# Patient Record
Sex: Female | Born: 1970 | Race: White | Hispanic: No | Smoking: Current every day smoker
Health system: Southern US, Community
[De-identification: ages and names within clinical notes are randomized; demographics above are authoritative.]

## PROBLEM LIST (undated history)

## (undated) DIAGNOSIS — I10 Essential (primary) hypertension: Secondary | ICD-10-CM

## (undated) DIAGNOSIS — Z720 Tobacco use: Secondary | ICD-10-CM

## (undated) DIAGNOSIS — F1290 Cannabis use, unspecified, uncomplicated: Secondary | ICD-10-CM

## (undated) DIAGNOSIS — J449 Chronic obstructive pulmonary disease, unspecified: Secondary | ICD-10-CM

## (undated) DIAGNOSIS — F419 Anxiety disorder, unspecified: Secondary | ICD-10-CM

## (undated) DIAGNOSIS — D491 Neoplasm of unspecified behavior of respiratory system: Secondary | ICD-10-CM

## (undated) DIAGNOSIS — E46 Unspecified protein-calorie malnutrition: Secondary | ICD-10-CM

## (undated) DIAGNOSIS — L409 Psoriasis, unspecified: Secondary | ICD-10-CM

## (undated) DIAGNOSIS — I639 Cerebral infarction, unspecified: Secondary | ICD-10-CM

## (undated) DIAGNOSIS — R918 Other nonspecific abnormal finding of lung field: Secondary | ICD-10-CM

## (undated) DIAGNOSIS — F17200 Nicotine dependence, unspecified, uncomplicated: Secondary | ICD-10-CM

## (undated) DIAGNOSIS — D649 Anemia, unspecified: Secondary | ICD-10-CM

## (undated) DIAGNOSIS — J189 Pneumonia, unspecified organism: Secondary | ICD-10-CM

## (undated) DIAGNOSIS — F4310 Post-traumatic stress disorder, unspecified: Secondary | ICD-10-CM

## (undated) DIAGNOSIS — B20 Human immunodeficiency virus [HIV] disease: Secondary | ICD-10-CM

## (undated) DIAGNOSIS — E785 Hyperlipidemia, unspecified: Secondary | ICD-10-CM

## (undated) DIAGNOSIS — F32A Depression, unspecified: Secondary | ICD-10-CM

## (undated) DIAGNOSIS — K219 Gastro-esophageal reflux disease without esophagitis: Secondary | ICD-10-CM

## (undated) HISTORY — PX: TUBAL LIGATION: SHX77

---

## 2005-05-02 ENCOUNTER — Ambulatory Visit: Payer: Self-pay | Admitting: Nurse Practitioner

## 2005-05-06 ENCOUNTER — Emergency Department: Payer: Self-pay | Admitting: General Practice

## 2007-10-22 ENCOUNTER — Emergency Department: Payer: Self-pay | Admitting: Emergency Medicine

## 2007-12-22 ENCOUNTER — Emergency Department: Payer: Self-pay | Admitting: Emergency Medicine

## 2008-02-20 DIAGNOSIS — Z8614 Personal history of Methicillin resistant Staphylococcus aureus infection: Secondary | ICD-10-CM

## 2008-02-20 HISTORY — DX: Personal history of Methicillin resistant Staphylococcus aureus infection: Z86.14

## 2009-01-19 ENCOUNTER — Ambulatory Visit: Payer: Self-pay | Admitting: Internal Medicine

## 2009-02-19 ENCOUNTER — Ambulatory Visit: Payer: Self-pay | Admitting: Internal Medicine

## 2009-02-21 ENCOUNTER — Ambulatory Visit: Payer: Self-pay | Admitting: Internal Medicine

## 2009-03-12 ENCOUNTER — Emergency Department: Payer: Self-pay | Admitting: Emergency Medicine

## 2009-03-22 ENCOUNTER — Ambulatory Visit: Payer: Self-pay | Admitting: Internal Medicine

## 2009-07-01 ENCOUNTER — Emergency Department: Payer: Self-pay | Admitting: Internal Medicine

## 2010-01-02 DIAGNOSIS — L409 Psoriasis, unspecified: Secondary | ICD-10-CM | POA: Insufficient documentation

## 2010-10-30 ENCOUNTER — Emergency Department: Payer: Self-pay | Admitting: Emergency Medicine

## 2010-10-31 DIAGNOSIS — N920 Excessive and frequent menstruation with regular cycle: Secondary | ICD-10-CM | POA: Insufficient documentation

## 2012-06-03 DIAGNOSIS — F32A Depression, unspecified: Secondary | ICD-10-CM | POA: Insufficient documentation

## 2012-06-03 DIAGNOSIS — G8929 Other chronic pain: Secondary | ICD-10-CM | POA: Insufficient documentation

## 2012-06-03 DIAGNOSIS — K219 Gastro-esophageal reflux disease without esophagitis: Secondary | ICD-10-CM | POA: Insufficient documentation

## 2012-06-03 DIAGNOSIS — F17200 Nicotine dependence, unspecified, uncomplicated: Secondary | ICD-10-CM | POA: Insufficient documentation

## 2014-09-11 ENCOUNTER — Emergency Department: Payer: Medicaid Other

## 2014-09-11 ENCOUNTER — Inpatient Hospital Stay
Admission: EM | Admit: 2014-09-11 | Discharge: 2014-09-12 | DRG: 976 | Disposition: A | Discharge status: Home / Self Care | Payer: Medicaid Other | Attending: Internal Medicine | Admitting: Internal Medicine

## 2014-09-11 ENCOUNTER — Encounter: Payer: Self-pay | Admitting: Emergency Medicine

## 2014-09-11 DIAGNOSIS — F4310 Post-traumatic stress disorder, unspecified: Secondary | ICD-10-CM | POA: Diagnosis present

## 2014-09-11 DIAGNOSIS — J189 Pneumonia, unspecified organism: Principal | ICD-10-CM | POA: Diagnosis present

## 2014-09-11 DIAGNOSIS — K219 Gastro-esophageal reflux disease without esophagitis: Secondary | ICD-10-CM | POA: Diagnosis present

## 2014-09-11 DIAGNOSIS — Z8249 Family history of ischemic heart disease and other diseases of the circulatory system: Secondary | ICD-10-CM | POA: Diagnosis not present

## 2014-09-11 DIAGNOSIS — J449 Chronic obstructive pulmonary disease, unspecified: Secondary | ICD-10-CM | POA: Diagnosis present

## 2014-09-11 DIAGNOSIS — E876 Hypokalemia: Secondary | ICD-10-CM

## 2014-09-11 DIAGNOSIS — B20 Human immunodeficiency virus [HIV] disease: Secondary | ICD-10-CM | POA: Diagnosis present

## 2014-09-11 DIAGNOSIS — F17210 Nicotine dependence, cigarettes, uncomplicated: Secondary | ICD-10-CM | POA: Diagnosis present

## 2014-09-11 DIAGNOSIS — F419 Anxiety disorder, unspecified: Secondary | ICD-10-CM | POA: Diagnosis present

## 2014-09-11 DIAGNOSIS — Z820 Family history of epilepsy and other diseases of the nervous system: Secondary | ICD-10-CM

## 2014-09-11 HISTORY — DX: Anxiety disorder, unspecified: F41.9

## 2014-09-11 HISTORY — DX: Chronic obstructive pulmonary disease, unspecified: J44.9

## 2014-09-11 HISTORY — DX: Human immunodeficiency virus (HIV) disease: B20

## 2014-09-11 HISTORY — DX: Gastro-esophageal reflux disease without esophagitis: K21.9

## 2014-09-11 HISTORY — DX: Post-traumatic stress disorder, unspecified: F43.10

## 2014-09-11 LAB — COMPREHENSIVE METABOLIC PANEL
ALBUMIN: 3.9 g/dL (ref 3.5–5.0)
ALT: 18 U/L (ref 14–54)
AST: 28 U/L (ref 15–41)
Alkaline Phosphatase: 62 U/L (ref 38–126)
Anion gap: 9 (ref 5–15)
BUN: 6 mg/dL (ref 6–20)
CALCIUM: 8.3 mg/dL — AB (ref 8.9–10.3)
CO2: 28 mmol/L (ref 22–32)
Chloride: 97 mmol/L — ABNORMAL LOW (ref 101–111)
Creatinine, Ser: 0.76 mg/dL (ref 0.44–1.00)
GFR calc Af Amer: >60 mL/min (ref >60)
GFR calc non Af Amer: >60 mL/min (ref >60)
Glucose, Bld: 108 mg/dL — ABNORMAL HIGH (ref 65–99)
Potassium: 2.5 mmol/L — CL (ref 3.5–5.1)
Sodium: 134 mmol/L — ABNORMAL LOW (ref 135–145)
Total Bilirubin: 0.6 mg/dL (ref 0.3–1.2)
Total Protein: 7 g/dL (ref 6.5–8.1)

## 2014-09-11 LAB — TROPONIN I

## 2014-09-11 LAB — CBC
HEMATOCRIT: 28.8 % — AB (ref 35.0–47.0)
Hemoglobin: 8.3 g/dL — ABNORMAL LOW (ref 12.0–16.0)
MCH: 19.8 pg — ABNORMAL LOW (ref 26.0–34.0)
MCHC: 28.9 g/dL — ABNORMAL LOW (ref 32.0–36.0)
MCV: 68.7 fL — ABNORMAL LOW (ref 80.0–100.0)
Platelets: 259 10*3/uL (ref 150–440)
RBC: 4.2 MIL/uL (ref 3.80–5.20)
RDW: 15.9 % — AB (ref 11.5–14.5)
WBC: 17.1 10*3/uL — AB (ref 3.6–11.0)

## 2014-09-11 MED ORDER — SODIUM CHLORIDE 0.9 % IV SOLN
1000.0000 mL | Freq: Once | INTRAVENOUS | Status: AC
  Administered 2014-09-11: 1000 mL via INTRAVENOUS

## 2014-09-11 MED ORDER — DULOXETINE HCL 30 MG PO CPEP
60.0000 mg | ORAL_CAPSULE | Freq: Every day | ORAL | Status: DC
  Administered 2014-09-11 – 2014-09-12 (×2): 60 mg via ORAL
  Filled 2014-09-11 (×2): qty 2

## 2014-09-11 MED ORDER — ACETAMINOPHEN 650 MG RE SUPP: 650.0000 mg | Freq: Four times a day (QID) | RECTAL | Status: DC | PRN

## 2014-09-11 MED ORDER — ABACAVIR SULFATE 300 MG PO TABS
600.0000 mg | ORAL_TABLET | Freq: Every day | ORAL | Status: DC
  Administered 2014-09-12: 600 mg via ORAL
  Filled 2014-09-11: qty 2

## 2014-09-11 MED ORDER — LEVOFLOXACIN IN D5W 750 MG/150ML IV SOLN
750.0000 mg | Freq: Once | INTRAVENOUS | Status: AC
  Administered 2014-09-11: 750 mg via INTRAVENOUS
  Filled 2014-09-11: qty 150

## 2014-09-11 MED ORDER — KETOROLAC TROMETHAMINE 30 MG/ML IJ SOLN
INTRAMUSCULAR | Status: AC
Start: 2014-09-11 — End: 2014-09-11
  Administered 2014-09-11: 30 mg via INTRAVENOUS
  Filled 2014-09-11: qty 1

## 2014-09-11 MED ORDER — ALBUTEROL SULFATE HFA 108 (90 BASE) MCG/ACT IN AERS: 2.0000 | INHALATION_SPRAY | Freq: Four times a day (QID) | RESPIRATORY_TRACT | Status: DC | PRN

## 2014-09-11 MED ORDER — ABACAVIR-DOLUTEGRAVIR-LAMIVUD 600-50-300 MG PO TABS: 1.0000 | ORAL_TABLET | Freq: Every day | ORAL | Status: DC

## 2014-09-11 MED ORDER — OXYCODONE-ACETAMINOPHEN 5-325 MG PO TABS
1.0000 | ORAL_TABLET | Freq: Four times a day (QID) | ORAL | Status: DC | PRN
Start: 2014-09-11 — End: 2014-09-12
  Administered 2014-09-11 – 2014-09-12 (×2): 1 via ORAL
  Filled 2014-09-11 (×2): qty 1

## 2014-09-11 MED ORDER — POTASSIUM CHLORIDE 10 MEQ/100ML IV SOLN
10.0000 meq | INTRAVENOUS | Status: AC
  Administered 2014-09-11 – 2014-09-12 (×3): 10 meq via INTRAVENOUS
  Filled 2014-09-11 (×4): qty 100

## 2014-09-11 MED ORDER — KETOROLAC TROMETHAMINE 30 MG/ML IJ SOLN
30.0000 mg | Freq: Once | INTRAMUSCULAR | Status: AC
  Administered 2014-09-11: 30 mg via INTRAVENOUS

## 2014-09-11 MED ORDER — MOMETASONE FURO-FORMOTEROL FUM 100-5 MCG/ACT IN AERO
2.0000 | INHALATION_SPRAY | Freq: Two times a day (BID) | RESPIRATORY_TRACT | Status: DC
  Administered 2014-09-12 (×2): 2 via RESPIRATORY_TRACT
  Filled 2014-09-11: qty 8.8

## 2014-09-11 MED ORDER — PANTOPRAZOLE SODIUM 40 MG PO TBEC
40.0000 mg | DELAYED_RELEASE_TABLET | Freq: Every day | ORAL | Status: DC
  Administered 2014-09-12 (×2): 40 mg via ORAL
  Filled 2014-09-11 (×2): qty 1

## 2014-09-11 MED ORDER — ENOXAPARIN SODIUM 40 MG/0.4ML ~~LOC~~ SOLN
40.0000 mg | SUBCUTANEOUS | Status: DC
  Administered 2014-09-11: 40 mg via SUBCUTANEOUS
  Filled 2014-09-11: qty 0.4

## 2014-09-11 MED ORDER — NICOTINE 14 MG/24HR TD PT24
14.0000 mg | MEDICATED_PATCH | Freq: Every day | TRANSDERMAL | Status: DC
  Filled 2014-09-11: qty 1

## 2014-09-11 MED ORDER — ZOLPIDEM TARTRATE 5 MG PO TABS: 5.0000 mg | ORAL_TABLET | Freq: Every evening | ORAL | Status: DC | PRN

## 2014-09-11 MED ORDER — POTASSIUM CHLORIDE 20 MEQ PO PACK
40.0000 meq | PACK | Freq: Once | ORAL | Status: AC
  Administered 2014-09-12: 40 meq via ORAL
  Filled 2014-09-11: qty 2

## 2014-09-11 MED ORDER — ONDANSETRON HCL 4 MG/2ML IJ SOLN: 4.0000 mg | Freq: Four times a day (QID) | INTRAMUSCULAR | Status: DC | PRN

## 2014-09-11 MED ORDER — IPRATROPIUM-ALBUTEROL 0.5-2.5 (3) MG/3ML IN SOLN
3.0000 mL | Freq: Once | RESPIRATORY_TRACT | Status: AC
  Administered 2014-09-11: 3 mL via RESPIRATORY_TRACT
  Filled 2014-09-11: qty 3

## 2014-09-11 MED ORDER — LEVOFLOXACIN IN D5W 750 MG/150ML IV SOLN
750.0000 mg | INTRAVENOUS | Status: DC
  Filled 2014-09-11: qty 150

## 2014-09-11 MED ORDER — ACETAMINOPHEN 325 MG PO TABS: 650.0000 mg | ORAL_TABLET | Freq: Four times a day (QID) | ORAL | Status: DC | PRN

## 2014-09-11 MED ORDER — SENNOSIDES-DOCUSATE SODIUM 8.6-50 MG PO TABS: 1.0000 | ORAL_TABLET | Freq: Every evening | ORAL | Status: DC | PRN

## 2014-09-11 MED ORDER — POTASSIUM CHLORIDE 20 MEQ PO PACK
40.0000 meq | PACK | Freq: Once | ORAL | Status: AC
  Administered 2014-09-11: 40 meq via ORAL
  Filled 2014-09-11: qty 2

## 2014-09-11 MED ORDER — ONDANSETRON HCL 4 MG PO TABS: 4.0000 mg | ORAL_TABLET | Freq: Four times a day (QID) | ORAL | Status: DC | PRN

## 2014-09-11 MED ORDER — GUAIFENESIN-DM 100-10 MG/5ML PO SYRP: 10.0000 mL | ORAL_SOLUTION | Freq: Four times a day (QID) | ORAL | Status: DC | PRN

## 2014-09-11 MED ORDER — DOLUTEGRAVIR SODIUM 50 MG PO TABS
50.0000 mg | ORAL_TABLET | Freq: Every day | ORAL | Status: DC
  Administered 2014-09-12: 50 mg via ORAL
  Filled 2014-09-11: qty 1

## 2014-09-11 MED ORDER — ALBUTEROL SULFATE (2.5 MG/3ML) 0.083% IN NEBU: 2.5000 mg | INHALATION_SOLUTION | Freq: Four times a day (QID) | RESPIRATORY_TRACT | Status: DC | PRN

## 2014-09-11 MED ORDER — LAMIVUDINE 150 MG PO TABS
300.0000 mg | ORAL_TABLET | Freq: Every day | ORAL | Status: DC
  Administered 2014-09-12: 300 mg via ORAL
  Filled 2014-09-11: qty 2

## 2014-09-11 NOTE — ED Provider Notes (Signed)
[facility] Emergency Department Provider Note  ____________________________________________  Time seen: On arrival  I have reviewed the triage vital signs and the nursing notes.   HISTORY  Chief Complaint Pleurisy    HPI Whitney [name] is a 44 y.o. female with a history of HIV who reports compliance with her medications who presents with fever and cough and right-sided chest discomfort that started this morning. She notes that her viral load was undetectable the beginning of this month. She is always compliant with her medications. She sees Dr. Wilson Singer at [facility]. She denies dizziness. She does note shortness of breath which is relatively chronic for her given her COPD but it doesn't seem to be worse today     Past Medical History  Diagnosis Date  . COPD (chronic obstructive pulmonary disease)   . HIV disease   . Anxiety   . PTSD (post-traumatic stress disorder)   . GERD (gastroesophageal reflux disease)     There are no active problems to display for this patient.   Past Surgical History  Procedure Laterality Date  . Cesarean section    . Tubal ligation      No current outpatient prescriptions on file.  Allergies Penicillins  No family history on file.  Social History History  Substance Use Topics  . Smoking status: Current Every Day Smoker -- 0.50 packs/day    Types: Cigarettes  . Smokeless tobacco: Not on file  . Alcohol Use: No    Review of Systems  Constitutional: Positive for fever Eyes: Negative for visual changes. ENT: Negative for sore throat Cardiovascular: Negative for chest pain. Respiratory: Positive for shortness of breath and cough Gastrointestinal: Negative for abdominal pain, vomiting and diarrhea. Genitourinary: Negative for dysuria. Musculoskeletal: Negative for back pain. Skin: Negative for rash. Neurological: Negative for headaches or focal weakness Psychiatric: No anxiety  10-point ROS otherwise  negative.  ____________________________________________   PHYSICAL EXAM:  VITAL SIGNS: ED Triage Vitals  Enc Vitals Group     BP 09/11/14 1822 120/80 mmHg     Pulse Rate 09/11/14 1822 114     Resp 09/11/14 1822 20     Temp 09/11/14 1822 102 F (38.9 C)     Temp Source 09/11/14 1822 Oral     SpO2 09/11/14 1822 95 %     Weight 09/11/14 1822 118 lb (53.524 kg)     Height 09/11/14 1822 5\' 4"  (1.626 m)     Head Cir --      Peak Flow --      Pain Score 09/11/14 1825 10     Pain Loc --      Pain Edu? --      Excl. in GC? --      Constitutional: Alert and oriented. Frail-appearing Eyes: Conjunctivae are normal.  ENT   Head: Normocephalic and atraumatic.   Mouth/Throat: Mucous membranes are moist. Cardiovascular: Normal rate, regular rhythm. Normal and symmetric distal pulses are present in all extremities. No murmurs, rubs, or gallops. Respiratory: Mild tachypnea, scattered wheezes Gastrointestinal: Soft and non-tender in all quadrants. No distention. There is no CVA tenderness. Genitourinary: deferred Musculoskeletal: Nontender with normal range of motion in all extremities. No lower extremity tenderness nor edema. Neurologic:  Normal speech and language. No gross focal neurologic deficits are appreciated. Skin:  Skin is warm, dry and intact. No rash noted. Psychiatric: Mood and affect are normal. Patient exhibits appropriate insight and judgment.  ____________________________________________    LABS (pertinent positives/negatives)  Labs Reviewed  COMPREHENSIVE  METABOLIC PANEL - Abnormal; Notable for the following:    Sodium 134 (*)    Potassium 2.5 (*)    Chloride 97 (*)    Glucose, Bld 108 (*)    Calcium 8.3 (*)    All other components within normal limits  CBC - Abnormal; Notable for the following:    WBC 17.1 (*)    Hemoglobin 8.3 (*)    HCT 28.8 (*)    MCV 68.7 (*)    MCH 19.8 (*)    MCHC 28.9 (*)    RDW 15.9 (*)    All other components within normal  limits  CULTURE, BLOOD (ROUTINE X 2)  CULTURE, BLOOD (ROUTINE X 2)  TROPONIN I    ____________________________________________   EKG  ED ECG REPORT I, Jene Every, the attending physician, personally viewed and interpreted this ECG.  Date: 09/11/2014 EKG Time: 6:37 PM Rate: 105 Rhythm: Sinus tachycardia QRS Axis: normal Intervals: normal ST/T Wave abnormalities: normal Conduction Disutrbances: none Narrative Interpretation: unremarkable   ____________________________________________    RADIOLOGY I have personally reviewed any xrays that were ordered on this patient: chest x-ray shows infiltrate right lower lung  ____________________________________________   PROCEDURES  Procedure(s) performed: none  Critical Care performed: none  ____________________________________________   INITIAL IMPRESSION / ASSESSMENT AND PLAN / ED COURSE  Pertinent labs & imaging results that were available during my care of the patient were reviewed by me and considered in my medical decision making (see chart for details).  Patient with a history of HIV, COPD with fever tachycardia cough and chest discomfort most consistent with pneumonia. Blood culture sent, IV antibiotics started  ----------------------------------------- 8:09 PM on 09/11/2014 -----------------------------------------  Patient with fever tachycardia elevated white blood cell count and evidence of infection on chest x-ray and we will admit to the hospital given continued tachycardia and hypokalemia   ____________________________________________   FINAL CLINICAL IMPRESSION(S) / ED DIAGNOSES  Final diagnoses:  Community acquired pneumonia  Hypokalemia     Jene Every, MD 09/11/14 2009

## 2014-09-11 NOTE — H&P (Signed)
[facility] Physicians - [place] at [facility]   PATIENT NAME: Whitney [name]    MR#:  [id number]  DATE OF BIRTH:  [birth year]  DATE OF ADMISSION:  09/11/2014  PRIMARY CARE PHYSICIAN: Jolene Provost III, MD   REQUESTING/REFERRING PHYSICIAN: Dr.Kinner  CHIEF COMPLAINT:   Sob, fever HISTORY OF PRESENT ILLNESS:  Whitney [name]  is a 44 y.o. female with a known history of HIV, reports compliance with her all HIV medications, sees Dr. Wilson Singer at [facility], is presented to the ED with a chief complaint of three-week history of shortness of breath and one-day history of fever. Her shortness of breath is chronic from her COPD but lately p gotten worse and associated with dry cough. Denies  any sick contacts. Reports her recent viral load at the beginning of the month was undetectable  PAST MEDICAL HISTORY:   Past Medical History  Diagnosis Date  . COPD (chronic obstructive pulmonary disease)   . HIV disease   . Anxiety   . PTSD (post-traumatic stress disorder)   . GERD (gastroesophageal reflux disease)     PAST SURGICAL HISTOIRY:   Past Surgical History  Procedure Laterality Date  . Cesarean section    . Tubal ligation      SOCIAL HISTORY:   History  Substance Use Topics  . Smoking status: Current Every Day Smoker -- 0.50 packs/day    Types: Cigarettes  . Smokeless tobacco: Not on file  . Alcohol Use: No   Lives  with her sister and son FAMILY HISTORY:  Mother has history of hypertension Dad with Parkinson's disease and coronary artery disease  DRUG ALLERGIES:   Allergies  Allergen Reactions  . Penicillins Hives  . Aspirin Rash    REVIEW OF SYSTEMS:  CONSTITUTIONAL: Has fever, denies fatigue or weakness.  EYES: No blurred or double vision.  EARS, NOSE, AND THROAT: No tinnitus or ear pain.  RESPIRATORY: Reporting dry cough, worsening of chronic shortness of breath, denies wheezing or hemoptysis. Reports right-sided chest pain when  coughing CARDIOVASCULAR: No chest pain, orthopnea, edema.  GASTROINTESTINAL: No nausea, vomiting, diarrhea or abdominal pain.  GENITOURINARY: No dysuria, hematuria.  ENDOCRINE: No polyuria, nocturia,  HEMATOLOGY: No anemia, easy bruising or bleeding SKIN: No rash or lesion. MUSCULOSKELETAL: No joint pain or arthritis.   NEUROLOGIC: No tingling, numbness, weakness.  PSYCHIATRY: No anxiety or depression.   MEDICATIONS AT HOME:   Prior to Admission medications   Medication Sig Start Date End Date Taking? Authorizing Provider  Abacavir-Dolutegravir-Lamivud (TRIUMEQ) 600-50-300 MG TABS Take 1 tablet by mouth daily. 08/09/14  Yes Historical Provider, MD  albuterol (PROVENTIL HFA;VENTOLIN HFA) 108 (90 BASE) MCG/ACT inhaler Inhale 2 puffs into the lungs every 6 (six) hours as needed for wheezing or shortness of breath.   Yes Historical Provider, MD  albuterol (PROVENTIL) (2.5 MG/3ML) 0.083% nebulizer solution Take 2.5 mg by nebulization every 6 (six) hours as needed for wheezing or shortness of breath.   Yes Historical Provider, MD  DULoxetine (CYMBALTA) 30 MG capsule Take 60 mg by mouth daily. 03/31/14  Yes Historical Provider, MD  Fluticasone-Salmeterol (ADVAIR) 250-50 MCG/DOSE AEPB Inhale 1 puff into the lungs 2 (two) times daily.   Yes Historical Provider, MD  omeprazole (PRILOSEC) 40 MG capsule Take 40 mg by mouth 2 (two) times daily. 06/07/14  Yes Historical Provider, MD      VITAL SIGNS:  Blood pressure 109/79, pulse 104, temperature 102 F (38.9 C), temperature source Oral, resp. rate 16, height 5\' 4"  (1.626 m),  weight 53.524 kg (118 lb), last menstrual period 07/30/2014, SpO2 96 %.  PHYSICAL EXAMINATION:  GENERAL:  44 y.o.-year-old thin looking patient lying in the bed with no acute distress.  EYES: Pupils equal, round, reactive to light and accommodation. No scleral icterus. Extraocular muscles intact.  HEENT: Head atraumatic, normocephalic. Oropharynx and nasopharynx clear.  NECK:   Supple, no jugular venous distention. No thyroid enlargement, no tenderness.  LUNGS: Normal breath sounds bilaterally, no wheezing, rales,rhonchi. No use of accessory muscles of respiration. Positive crackles on the right side, minimal,Reproducible anterior chest wall tenderness on the right side CARDIOVASCULAR: S1, S2 normal. No murmurs, rubs, or gallops.  ABDOMEN: Soft, nontender, nondistended. Bowel sounds present. No organomegaly or mass.  EXTREMITIES: No pedal edema, cyanosis, or clubbing.  NEUROLOGIC: Cranial nerves II through XII are intact. Muscle strength 5/5 in all extremities. Sensation intact. Gait not checked.  PSYCHIATRIC: The patient is alert and oriented x 3.  SKIN: No obvious rash, lesion, or ulcer.   LABORATORY PANEL:   CBC  Recent Labs Lab 09/11/14 1839  WBC 17.1*  HGB 8.3*  HCT 28.8*  PLT 259   ------------------------------------------------------------------------------------------------------------------  Chemistries   Recent Labs Lab 09/11/14 1839  NA 134*  K 2.5*  CL 97*  CO2 28  GLUCOSE 108*  BUN 6  CREATININE 0.76  CALCIUM 8.3*  AST 28  ALT 18  ALKPHOS 62  BILITOT 0.6   ------------------------------------------------------------------------------------------------------------------  Cardiac Enzymes  Recent Labs Lab 09/11/14 1839  TROPONINI <0.03   ------------------------------------------------------------------------------------------------------------------  RADIOLOGY:  Dg Chest Port 1 View  09/11/2014   CLINICAL DATA:  Right-sided chest pain and difficulty breathing. History of HIV disease  EXAM: PORTABLE CHEST - 1 VIEW  COMPARISON:  None.  FINDINGS: There is a focal area of opacity in the right infrahilar region, felt to represent a focal area of pneumonia. Lungs elsewhere clear. Heart size and pulmonary vascularity are normal. No adenopathy. No pneumothorax. No bone lesions.  IMPRESSION: Focal area of infiltrate in the right  infrahilar region. Elsewhere lungs are clear.   Electronically Signed   By: Bretta Bang III M.D.   On: 09/11/2014 19:50    EKG:   Orders placed or performed during the hospital encounter of 09/11/14  . ED EKG  . ED EKG    IMPRESSION AND PLAN:   Whitney [name]  is a 44 y.o. female with a known history of HIV, reports compliance with her all HIV medications, sees Dr. Wilson Singer at [facility], is presented to the ED with a chief complaint of three-week history of shortness of breath and one-day history of fever  1. Sepsis with community-acquired pneumonia-meets criteria with leukocytosis and fever Treat with IV levofloxacin as her recent HIV viral load is undetectable in the beginning of the month Follow sputum culture and sensitivity and blood cultures  2. Chronic history of HIV with undetectable recent viral load Resume her home medication and outpatient follow-up with [facility] as recommended  3. Hypokalemia  Replace potassium by mouth and IV and check potassium levels and magnesium in a.m.  4. Chronic history of COPD with no exacerbation  provide neb laser treatments as needed basis  5. GERD Protonix  6. Nicotine dependence Counseled patient to quit smoking for 4-5 minutes. Will provide nicotine patch   DVT prophylaxis with Lovenox subcutaneous   All the records are reviewed and case discussed with ED provider. Management plans discussed with the patient, family and they are in agreement.  CODE STATUS: Full code, son is a  medical power of attorney  TOTAL TIME TAKING CARE OF THIS PATIENT, reviewing medical records, history and physical, admission orders , coordination of care and others: 50 minutes.    Ramonita Lab M.D on 09/11/2014 at 9:15 PM  Between 7am to 6pm - Pager - [phone]  After 6pm go to [email or web address] - password [email or web address] [facility]  [place] [facility]  Office  [phone]  CC: Primary care physician; Shaune Leeks, MD

## 2014-09-11 NOTE — ED Notes (Signed)
Blood culture draw #1 from RIGHT hand Blood culture draw #2 from LEFT AC.

## 2014-09-11 NOTE — Progress Notes (Signed)
ANTIBIOTIC CONSULT NOTE - INITIAL  Pharmacy Consult for [facility] Indication: pneumonia/CAP  Allergies  Allergen Reactions  . Penicillins Hives  . Aspirin Rash    Patient Measurements: Height:  (162.6 cm) Weight: 108 lb (48.988 kg) IBW/kg (Calculated) : 54.7 Adjusted Body Weight:   Vital Signs: Temp: 98.9 F (37.2 C) (07/23 2200) Temp Source: Oral (07/23 2200) BP: 124/86 mmHg (07/23 2200) Pulse Rate: 105 (07/23 2200) Intake/Output from previous day:   Intake/Output from this shift:    Labs:  Recent Labs  09/11/14 1839  WBC 17.1*  HGB 8.3*  PLT 259  CREATININE 0.76   Estimated Creatinine Clearance: 68.7 mL/min (by C-G formula based on Cr of 0.76).   Microbiology: No results found for this or any previous visit (from the past 720 hour(s)).  Medical History: Past Medical History  Diagnosis Date  . COPD (chronic obstructive pulmonary disease)   . HIV disease   . Anxiety   . PTSD (post-traumatic stress disorder)   . GERD (gastroesophageal reflux disease)     Medications:  Scheduled:  . [START ON 09/12/2014] abacavir  600 mg Oral Daily   And  . [START ON 09/12/2014] dolutegravir  50 mg Oral Daily   And  . [START ON 09/12/2014] lamiVUDine  300 mg Oral Daily  . DULoxetine  60 mg Oral Daily  . enoxaparin (LOVENOX) injection  40 mg Subcutaneous Q24H  . [START ON 09/12/2014] levofloxacin (LEVAQUIN) IV  750 mg Intravenous Q24H  . mometasone-formoterol  2 puff Inhalation BID  . nicotine  14 mg Transdermal Daily  . pantoprazole  40 mg Oral Daily  . [START ON 09/12/2014] potassium chloride  40 mEq Oral Once  . potassium chloride  10 mEq Intravenous Q1 Hr x 4   Anti-infectives    Start     Dose/Rate Route Frequency Ordered Stop   09/12/14 1800  levofloxacin (LEVAQUIN) IVPB 750 mg     750 mg 100 mL/hr over 90 Minutes Intravenous Every 24 hours 09/11/14 2216     09/12/14 1000  abacavir (ZIAGEN) tablet 600 mg     600 mg Oral Daily 09/11/14 2203     09/12/14  1000  dolutegravir (TIVICAY) tablet 50 mg     50 mg Oral Daily 09/11/14 2203     09/12/14 1000  lamiVUDine (EPIVIR) tablet 300 mg     300 mg Oral Daily 09/11/14 2203     09/11/14 2200  Abacavir-Dolutegravir-Lamivud 600-50-300 MG TABS 1 tablet  Status:  Discontinued     1 tablet Oral Daily 09/11/14 2158 09/11/14 2201   09/11/14 1945  levofloxacin (LEVAQUIN) IVPB 750 mg     750 mg 100 mL/hr over 90 Minutes Intravenous  Once 09/11/14 1932 09/11/14 2114     Assessment: 44 yo female with CAP, hx HIV with undectable viral load.    Plan:  Follow up culture results  Patient received Levaquin  IV x 1 dose.  Will continue with Levaquin  IV Q24h.  Bari Mantis PharmD Clinical Pharmacist 09/11/2014

## 2014-09-11 NOTE — ED Notes (Signed)
States has COPD, states had been SOB which she rlated to heat, woke up with severe R chest pain exp with inspiration

## 2014-09-12 LAB — CBC
HEMATOCRIT: 27 % — AB (ref 35.0–47.0)
HEMOGLOBIN: 7.6 g/dL — AB (ref 12.0–16.0)
MCH: 19.5 pg — AB (ref 26.0–34.0)
MCHC: 28.2 g/dL — AB (ref 32.0–36.0)
MCV: 69.2 fL — ABNORMAL LOW (ref 80.0–100.0)
Platelets: 224 10*3/uL (ref 150–440)
RBC: 3.9 MIL/uL (ref 3.80–5.20)
RDW: 16 % — AB (ref 11.5–14.5)
WBC: 12.7 10*3/uL — ABNORMAL HIGH (ref 3.6–11.0)

## 2014-09-12 LAB — COMPREHENSIVE METABOLIC PANEL
ALT: 16 U/L (ref 14–54)
AST: 22 U/L (ref 15–41)
Albumin: 3.1 g/dL — ABNORMAL LOW (ref 3.5–5.0)
Alkaline Phosphatase: 54 U/L (ref 38–126)
Anion gap: 8 (ref 5–15)
BUN: 7 mg/dL (ref 6–20)
CALCIUM: 8.6 mg/dL — AB (ref 8.9–10.3)
CO2: 29 mmol/L (ref 22–32)
CREATININE: 0.78 mg/dL (ref 0.44–1.00)
Chloride: 104 mmol/L (ref 101–111)
GFR calc non Af Amer: >60 mL/min (ref >60)
Glucose, Bld: 124 mg/dL — ABNORMAL HIGH (ref 65–99)
Potassium: 2.9 mmol/L — ABNORMAL LOW (ref 3.5–5.1)
Sodium: 141 mmol/L (ref 135–145)
TOTAL PROTEIN: 5.9 g/dL — AB (ref 6.5–8.1)
Total Bilirubin: 0.5 mg/dL (ref 0.3–1.2)

## 2014-09-12 LAB — POTASSIUM
POTASSIUM: 4.2 mmol/L (ref 3.5–5.1)
Potassium: 2.9 mmol/L — CL (ref 3.5–5.1)

## 2014-09-12 LAB — MAGNESIUM: MAGNESIUM: 1.8 mg/dL (ref 1.7–2.4)

## 2014-09-12 MED ORDER — POTASSIUM CHLORIDE CRYS ER 20 MEQ PO TBCR
40.0000 meq | EXTENDED_RELEASE_TABLET | ORAL | Status: AC
  Administered 2014-09-12 (×2): 40 meq via ORAL
  Filled 2014-09-12 (×2): qty 2

## 2014-09-12 MED ORDER — MAGNESIUM SULFATE IN D5W 10-5 MG/ML-% IV SOLN
1.0000 g | Freq: Once | INTRAVENOUS | Status: AC
  Administered 2014-09-12: 1 g via INTRAVENOUS
  Filled 2014-09-12: qty 100

## 2014-09-12 MED ORDER — FERROUS SULFATE 325 (65 FE) MG PO TABS: 325.0000 mg | ORAL_TABLET | Freq: Two times a day (BID) | ORAL | Status: DC

## 2014-09-12 MED ORDER — LEVOFLOXACIN 750 MG PO TABS: 750.0000 mg | ORAL_TABLET | Freq: Every day | ORAL | Status: DC

## 2014-09-12 MED ORDER — POTASSIUM CHLORIDE 10 MEQ/100ML IV SOLN
10.0000 meq | INTRAVENOUS | Status: DC
  Filled 2014-09-12 (×4): qty 100

## 2014-09-12 NOTE — Progress Notes (Signed)
Discharge Note:  Discharge instructions given to pt, pt verbalized understanding. Pts VSS. Pt wheeled to car.

## 2014-09-12 NOTE — Discharge Summary (Signed)
[facility] Physicians - [place] at [facility]   PATIENT NAME: Whitney [name]    MR#:  [id number]  DATE OF BIRTH:  10/12/1970  DATE OF ADMISSION:  09/11/2014 ADMITTING PHYSICIAN: Ramonita Lab, MD  DATE OF DISCHARGE: 09/12/2014 PRIMARY CARE PHYSICIAN: Jolene Provost III, MD    ADMISSION DIAGNOSIS:  Hypokalemia [E87.6] Community acquired pneumonia [J18.9]  DISCHARGE DIAGNOSIS:  Active Problems:   PNA (pneumonia)   SECONDARY DIAGNOSIS:   Past Medical History  Diagnosis Date  . COPD (chronic obstructive pulmonary disease)   . HIV disease   . Anxiety   . PTSD (post-traumatic stress disorder)   . GERD (gastroesophageal reflux disease)     HOSPITAL COURSE:  44 y.o. female with a known history of HIV, reports compliance with her all HIV medications, sees Dr. Wilson Singer at [facility], is presented to the ED with a chief complaint of three-week history of shortness of breath and one-day history of fever. For further details, refer to H and P.  1. Sepsis with community-acquired pneumonia-meets criteria with leukocytosis and fever She was treated with  IV levofloxacin as her recent HIV viral load is undetectable. She did well with this treatment and was afebrile and not hypoxic. She should have follow up CXR in 6 weeks due to her history of smoking. Blood cultures are negative to date.  2. Chronic history of HIV with undetectable recent viral load Continue her home medications and outpatient follow-up with [facility] is recommended  3. Hypokalemia: Repleted prior to discharge.   4. Chronic history of COPD with no exacerbation:   5. GERD: Continue PPI.  6. Nicotine dependence Encouraged to quit smoking   DISCHARGE CONDITIONS AND DIET:  Stable for discharge Heart Healthy Diet  CONSULTS OBTAINED:  Treatment Team:  Ramonita Lab, MD  DRUG ALLERGIES:   Allergies  Allergen Reactions  . Penicillins Hives  . Aspirin Rash    DISCHARGE MEDICATIONS:   Current Discharge  Medication List    START taking these medications   Details  ferrous sulfate 325 (65 FE) MG tablet Take 1 tablet (325 mg total) by mouth 2 (two) times daily with a meal. Qty: 60 tablet, Refills: 0    levofloxacin (LEVAQUIN) 750 MG tablet Take 1 tablet (750 mg total) by mouth daily. Qty: 6 tablet, Refills: 0      CONTINUE these medications which have NOT CHANGED   Details  Abacavir-Dolutegravir-Lamivud (TRIUMEQ) 600-50-300 MG TABS Take 1 tablet by mouth daily.    albuterol (PROVENTIL HFA;VENTOLIN HFA) 108 (90 BASE) MCG/ACT inhaler Inhale 2 puffs into the lungs every 6 (six) hours as needed for wheezing or shortness of breath.    albuterol (PROVENTIL) (2.5 MG/3ML) 0.083% nebulizer solution Take 2.5 mg by nebulization every 6 (six) hours as needed for wheezing or shortness of breath.    DULoxetine (CYMBALTA) 30 MG capsule Take 60 mg by mouth daily.    Fluticasone-Salmeterol (ADVAIR) 250-50 MCG/DOSE AEPB Inhale 1 puff into the lungs 2 (two) times daily.    omeprazole (PRILOSEC) 40 MG capsule Take 40 mg by mouth 2 (two) times daily.              Today   CHIEF COMPLAINT:  No issues overnight. NO SOB or fevers. Feeling good   VITAL SIGNS:  Blood pressure 133/83, pulse 82, temperature 97.6 F (36.4 C), temperature source Oral, resp. rate 18, height 5\' 4"  (1.626 m), weight 48.988 kg (108 lb), last menstrual period 07/30/2014, SpO2 100 %.   REVIEW OF SYSTEMS:  Review of Systems  Constitutional: Negative for fever, chills and malaise/fatigue.  HENT: Negative for sore throat.   Eyes: Negative for blurred vision.  Respiratory: Negative for cough, hemoptysis, shortness of breath and wheezing.   Cardiovascular: Negative for chest pain, palpitations and leg swelling.  Gastrointestinal: Negative for nausea, vomiting, abdominal pain, diarrhea and blood in stool.  Genitourinary: Negative for dysuria.  Musculoskeletal: Negative for back pain.  Neurological: Negative for dizziness,  tremors and headaches.  Endo/Heme/Allergies: Does not bruise/bleed easily.     PHYSICAL EXAMINATION:  GENERAL:  44 y.o.-year-old patient lying in the bed with no acute distress.  NECK:  Supple, no jugular venous distention. No thyroid enlargement, no tenderness.  LUNGS: Normal breath sounds bilaterally, no wheezing, rales,rhonchi  No use of accessory muscles of respiration.  CARDIOVASCULAR: S1, S2 normal. No murmurs, rubs, or gallops.  ABDOMEN: Soft, non-tender, non-distended. Bowel sounds present. No organomegaly or mass.  EXTREMITIES: No pedal edema, cyanosis, or clubbing.  PSYCHIATRIC: The patient is alert and oriented x 3.  SKIN: No obvious rash, lesion, or ulcer.   DATA REVIEW:   CBC  Recent Labs Lab 09/12/14 0416  WBC 12.7*  HGB 7.6*  HCT 27.0*  PLT 224    Chemistries   Recent Labs Lab 09/12/14 0416  NA 141  K 2.9*  CL 104  CO2 29  GLUCOSE 124*  BUN 7  CREATININE 0.78  CALCIUM 8.6*  MG 1.8  AST 22  ALT 16  ALKPHOS 54  BILITOT 0.5    Cardiac Enzymes  Recent Labs Lab 09/11/14 1839  TROPONINI <0.03    Microbiology Results  @  RADIOLOGY:  Dg Chest Port 1 View  09/11/2014   CLINICAL DATA:  Right-sided chest pain and difficulty breathing. History of HIV disease  EXAM: PORTABLE CHEST - 1 VIEW  COMPARISON:  None.  FINDINGS: There is a focal area of opacity in the right infrahilar region, felt to represent a focal area of pneumonia. Lungs elsewhere clear. Heart size and pulmonary vascularity are normal. No adenopathy. No pneumothorax. No bone lesions.  IMPRESSION: Focal area of infiltrate in the right infrahilar region. Elsewhere lungs are clear.   Electronically Signed   By: Bretta Bang III M.D.   On: 09/11/2014 19:50      Management plans discussed with the patient and she is in agreement. Stable for discharge home  Patient should follow up with PCP in 1 week  CODE STATUS:     Code Status Orders        Start     Ordered    09/11/14 2159  Full code   Continuous     09/11/14 2158      TOTAL TIME TAKING CARE OF THIS PATIENT: 35 minutes.    Kerrie Latour M.D on 09/12/2014 at 10:32 AM  Between 7am to 6pm - Pager - [phone] After 6pm go to [email or web address] - password [email or web address] [facility]  [place] [facility]  Office  [phone]  CC: Primary care physician; Shaune Leeks, MD

## 2014-09-12 NOTE — Progress Notes (Signed)
Lab called with critical lab value of K 2.9. MD Mody notified and orders were placed. Will continue to monitor.

## 2014-09-12 NOTE — Progress Notes (Signed)
Notified Dr Juliene Pina of pt lab value potassium of  4.2  Stated pt okay for discharge

## 2014-09-12 NOTE — Progress Notes (Signed)
Pt to be discharged after potassium has been replaced however no serum potassium recheck has been ordered , nursing called Dr Juliene Pina and discussed situation and Dr Juliene Pina will order lab for recheck of potassium

## 2014-09-12 NOTE — Progress Notes (Signed)
Reported to Dr Juliene Pina serum POT 2.9 orders received

## 2014-09-16 LAB — CULTURE, BLOOD (ROUTINE X 2)
CULTURE: NO GROWTH
Culture: NO GROWTH

## 2014-09-29 ENCOUNTER — Other Ambulatory Visit: Payer: Self-pay

## 2014-09-29 ENCOUNTER — Encounter: Payer: Self-pay | Admitting: *Deleted

## 2014-09-29 ENCOUNTER — Emergency Department: Payer: Medicaid Other

## 2014-09-29 ENCOUNTER — Emergency Department
Admission: EM | Admit: 2014-09-29 | Discharge: 2014-09-29 | Disposition: A | Discharge status: Home / Self Care | Payer: Medicaid Other | Attending: Emergency Medicine | Admitting: Emergency Medicine

## 2014-09-29 DIAGNOSIS — R0600 Dyspnea, unspecified: Secondary | ICD-10-CM | POA: Diagnosis present

## 2014-09-29 DIAGNOSIS — E876 Hypokalemia: Secondary | ICD-10-CM | POA: Diagnosis not present

## 2014-09-29 DIAGNOSIS — Z880 Allergy status to penicillin: Secondary | ICD-10-CM | POA: Insufficient documentation

## 2014-09-29 DIAGNOSIS — Z7952 Long term (current) use of systemic steroids: Secondary | ICD-10-CM | POA: Diagnosis not present

## 2014-09-29 DIAGNOSIS — J441 Chronic obstructive pulmonary disease with (acute) exacerbation: Secondary | ICD-10-CM | POA: Insufficient documentation

## 2014-09-29 DIAGNOSIS — Z7951 Long term (current) use of inhaled steroids: Secondary | ICD-10-CM | POA: Diagnosis not present

## 2014-09-29 DIAGNOSIS — Z79899 Other long term (current) drug therapy: Secondary | ICD-10-CM | POA: Diagnosis not present

## 2014-09-29 DIAGNOSIS — Z720 Tobacco use: Secondary | ICD-10-CM | POA: Diagnosis not present

## 2014-09-29 LAB — COMPREHENSIVE METABOLIC PANEL
ALK PHOS: 72 U/L (ref 38–126)
ALT: 86 U/L — ABNORMAL HIGH (ref 14–54)
ANION GAP: 10 (ref 5–15)
AST: 186 U/L — ABNORMAL HIGH (ref 15–41)
Albumin: 3.9 g/dL (ref 3.5–5.0)
BILIRUBIN TOTAL: 0.3 mg/dL (ref 0.3–1.2)
BUN: 11 mg/dL (ref 6–20)
CO2: 27 mmol/L (ref 22–32)
CREATININE: 0.85 mg/dL (ref 0.44–1.00)
Calcium: 8.6 mg/dL — ABNORMAL LOW (ref 8.9–10.3)
Chloride: 105 mmol/L (ref 101–111)
GFR calc non Af Amer: >60 mL/min (ref >60)
GLUCOSE: 134 mg/dL — AB (ref 65–99)
POTASSIUM: 2.9 mmol/L — AB (ref 3.5–5.1)
Sodium: 142 mmol/L (ref 135–145)
Total Protein: 6.8 g/dL (ref 6.5–8.1)

## 2014-09-29 LAB — CBC WITH DIFFERENTIAL/PLATELET
Basophils Absolute: 0 10*3/uL (ref 0–0.1)
Basophils Relative: 0 %
Eosinophils Absolute: 0.1 10*3/uL (ref 0–0.7)
HEMATOCRIT: 27.5 % — AB (ref 35.0–47.0)
HEMOGLOBIN: 7.8 g/dL — AB (ref 12.0–16.0)
LYMPHS ABS: 1.1 10*3/uL (ref 1.0–3.6)
Lymphocytes Relative: 15 %
MCH: 19.2 pg — ABNORMAL LOW (ref 26.0–34.0)
MCHC: 28.4 g/dL — ABNORMAL LOW (ref 32.0–36.0)
MCV: 67.8 fL — AB (ref 80.0–100.0)
MONO ABS: 0.4 10*3/uL (ref 0.2–0.9)
Monocytes Relative: 5 %
NEUTROS ABS: 5.8 10*3/uL (ref 1.4–6.5)
Neutrophils Relative %: 79 %
Platelets: 219 10*3/uL (ref 150–440)
RBC: 4.06 MIL/uL (ref 3.80–5.20)
RDW: 16.4 % — ABNORMAL HIGH (ref 11.5–14.5)
WBC: 7.4 10*3/uL (ref 3.6–11.0)

## 2014-09-29 LAB — TROPONIN I: Troponin I: <0.03 ng/mL (ref <0.031)

## 2014-09-29 LAB — ETHANOL: Alcohol, Ethyl (B): <5 mg/dL (ref <5)

## 2014-09-29 MED ORDER — POTASSIUM CHLORIDE CRYS ER 20 MEQ PO TBCR
40.0000 meq | EXTENDED_RELEASE_TABLET | Freq: Once | ORAL | Status: AC
  Administered 2014-09-29: 40 meq via ORAL
  Filled 2014-09-29: qty 2

## 2014-09-29 MED ORDER — PREDNISONE 10 MG PO TABS: 50.0000 mg | ORAL_TABLET | Freq: Every day | ORAL | Status: DC

## 2014-09-29 MED ORDER — IPRATROPIUM-ALBUTEROL 0.5-2.5 (3) MG/3ML IN SOLN
3.0000 mL | Freq: Once | RESPIRATORY_TRACT | Status: AC
  Administered 2014-09-29: 3 mL via RESPIRATORY_TRACT
  Filled 2014-09-29: qty 3

## 2014-09-29 NOTE — ED Notes (Addendum)
Pt recently admitted for pneumonia, d/c finished Abx, pt states not feeling better. Pt has odor of ETOH, continues to smoke cigarettes. EMS gave duo neb x2, PIV, 125 mg Sol medrol

## 2014-09-29 NOTE — ED Provider Notes (Signed)
[facility] Emergency Department Provider Note   ____________________________________________  Time seen: 8:55 AM I have reviewed triage vital signs and triage nursing note.  HISTORY  Chief Complaint Respiratory Distress   Historian Patient  HPI Whitney [name] is a 44 y.o. female who has history of HIV disease, is compliant with her medications, and reports recent undetectable viral load, who was recently admitted in the hospital and treated for a pneumonia, who is here now for shortness of breath and wheezing. She reports the last 1-2 days have had increased shortness of breath and wheezing similar to her COPD. She does report that she had improved from the pneumonia. No fevers. No chest pain. Symptoms are moderate to severe. Does not use home O2. No fever.    Past Medical History  Diagnosis Date  . COPD (chronic obstructive pulmonary disease)   . HIV disease   . Anxiety   . PTSD (post-traumatic stress disorder)   . GERD (gastroesophageal reflux disease)     Patient Active Problem List   Diagnosis Date Noted  . PNA (pneumonia) 09/11/2014    Past Surgical History  Procedure Laterality Date  . Cesarean section    . Tubal ligation      Current Outpatient Rx  Name  Route  Sig  Dispense  Refill  . Abacavir-Dolutegravir-Lamivud (TRIUMEQ) 600-50-300 MG TABS   Oral   Take 1 tablet by mouth daily.         Marland Kitchen albuterol (PROVENTIL HFA;VENTOLIN HFA) 108 (90 BASE) MCG/ACT inhaler   Inhalation   Inhale 2 puffs into the lungs every 6 (six) hours as needed for wheezing or shortness of breath.         Marland Kitchen albuterol (PROVENTIL) (2.5 MG/3ML) 0.083% nebulizer solution   Nebulization   Take 2.5 mg by nebulization every 6 (six) hours as needed for wheezing or shortness of breath.         . DULoxetine (CYMBALTA) 30 MG capsule   Oral   Take 60 mg by mouth daily.         . ferrous sulfate 325 (65 FE) MG tablet   Oral   Take 1 tablet (325 mg total)  by mouth 2 (two) times daily with a meal.   60 tablet   0   . Fluticasone-Salmeterol (ADVAIR) 250-50 MCG/DOSE AEPB   Inhalation   Inhale 1 puff into the lungs 2 (two) times daily.         Marland Kitchen omeprazole (PRILOSEC) 40 MG capsule   Oral   Take 40 mg by mouth 2 (two) times daily.         Marland Kitchen levofloxacin (LEVAQUIN) 750 MG tablet   Oral   Take 1 tablet (750 mg total) by mouth daily. Patient not taking: Reported on 09/29/2014   6 tablet   0   . predniSONE (DELTASONE) 10 MG tablet   Oral   Take 5 tablets (50 mg total) by mouth daily.   20 tablet   0     Allergies Penicillins and Aspirin  History reviewed. No pertinent family history.  Social History Social History  Substance Use Topics  . Smoking status: Current Every Day Smoker -- 0.50 packs/day    Types: Cigarettes  . Smokeless tobacco: None  . Alcohol Use: No    Review of Systems  Constitutional: Negative for fever. Eyes: Negative for visual changes. ENT: Negative for sore throat. Cardiovascular: Negative for chest pain. Positive for chest tightness. Respiratory: Positive for shortness of breath. No  pleuritic chest pain. Mild dry cough. Gastrointestinal: Negative for abdominal pain, vomiting and diarrhea. Genitourinary: Negative for dysuria. Musculoskeletal: Negative for back pain. Skin: Negative for rash. Neurological: Negative for headaches, focal weakness or numbness. 10 point Review of Systems otherwise negative ____________________________________________   PHYSICAL EXAM:  VITAL SIGNS: ED Triage Vitals  Enc Vitals Group     BP 09/29/14 0807 119/79 mmHg     Pulse Rate 09/29/14 0807 101     Resp --      Temp 09/29/14 0807 97.8 F (36.6 C)     Temp Source 09/29/14 0807 Oral     SpO2 09/29/14 0803 96 %     Weight 09/29/14 0807 102 lb (46.267 kg)     Height 09/29/14 0807  (1.626 m)     Head Cir --      Peak Flow --      Pain Score 09/29/14 0811 0     Pain Loc --      Pain Edu? --       Excl. in GC? --      Constitutional: Alert and oriented. Well appearing and in no distress. Eyes: Conjunctivae are normal. PERRL. Normal extraocular movements. ENT   Head: Normocephalic and atraumatic.   Nose: No congestion/rhinnorhea.   Mouth/Throat: Mucous membranes are mildly dry..   Neck: No stridor. Cardiovascular/Chest: Normal rate, regular rhythm.  No murmurs, rubs, or gallops. Respiratory: Substernal retractions. Increased respiratory effort. Decreased air movement throughout all lung fields. Moderate and expiratory wheeze all fields. No rhonchi. Gastrointestinal: Soft. No distention, no guarding, no rebound. Nontender   Genitourinary/rectal:Deferred Musculoskeletal: Nontender with normal range of motion in all extremities. No joint effusions.  No lower extremity tenderness nor edema. Neurologic:  Normal speech and language. No gross or focal neurologic deficits are appreciated. Skin:  Skin is warm, dry and intact. No rash noted. Psychiatric: Mood and affect are normal. Speech and behavior are normal. Patient exhibits appropriate insight and judgment.  ____________________________________________   EKG I, Governor Rooks, MD, the attending physician have personally viewed and interpreted all ECGs.  96 bpm. Normal sinus rhythm. Narrow QRS. Normal axis. Nonspecific ST-T wave. QTc 433 ____________________________________________  LABS (pertinent positives/negatives)  White blood count 7.4, hemoglobin 7.8, platelet is 219 Complete metabolic panel significant for potassium 2.9, glucose 134, AST 186 and a LT 86 Troponin less than 0.03 Ethanol less than 5  ____________________________________________  RADIOLOGY All Xrays were viewed by me. Imaging interpreted by Radiologist.  Chest x-ray two-view: Hyperinflation. Central mild bronchitic change. No focal infiltrate or pulmonary edema.  __________________________________________  PROCEDURES  Procedure(s)  performed: None Critical Care performed: None  ____________________________________________   ED COURSE / ASSESSMENT AND PLAN  CONSULTATIONS: None  Pertinent labs & imaging results that were available during my care of the patient were reviewed by me and considered in my medical decision making (see chart for details).  Patient arrived with moderate to severe wheezing which did improve after DuoNeb. Patient was able to walk without desaturation, or any trouble. Patient is asking to go home. She will be treated with a prednisone burst as well as when necessary albuterol.  Patient much improved at 12 before discharge.  Patient / Family / Caregiver informed of clinical course, medical decision-making process, and agree with plan.   I discussed return precautions, follow-up instructions, and discharged instructions with patient and/or family.  ___________________________________________   FINAL CLINICAL IMPRESSION(S) / ED DIAGNOSES   Final diagnoses:  Hypokalemia  COPD exacerbation    FOLLOW  UP  Referred to: Primary care physician, 2 days.   Governor Rooks, MD 09/29/14 [phone]

## 2014-09-29 NOTE — Discharge Instructions (Signed)
You were treated for COPD exacerbation. You're being started on 4 additional days of prednisone. Take your albuterol inhaler every 4 hours as needed for wheezing or shortness breath.  Return to the emergency department for any worsening condition including chest pain, trouble breathing, shortness breath, fever, altered mental status, or any other symptoms concerning to you.   Chronic Obstructive Pulmonary Disease Chronic obstructive pulmonary disease (COPD) is a common lung problem. In COPD, the flow of air from the lungs is limited. The way your lungs work will probably never return to normal, but there are things you can do to improve your lungs and make yourself feel better. HOME CARE  Take all medicines as told by your doctor.  Avoid medicines or cough syrups that dry up your airway (such as antihistamines) and do not allow you to get rid of thick spit. You do not need to avoid them if told differently by your doctor.  If you smoke, stop. Smoking makes the problem worse.  Avoid being around things that make your breathing worse (like smoke, chemicals, and fumes).  Use oxygen therapy and therapy to help improve your lungs (pulmonary rehabilitation) if told by your doctor. If you need home oxygen therapy, ask your doctor if you should buy a tool to measure your oxygen level (oximeter).  Avoid people who have a sickness you can catch (contagious).  Avoid going outside when it is very hot, cold, or humid.  Eat healthy foods. Eat smaller meals more often. Rest before meals.  Stay active, but remember to also rest.  Make sure to get all the shots (vaccines) your doctor recommends. Ask your doctor if you need a pneumonia shot.  Learn and use tips on how to relax.  Learn and use tips on how to control your breathing as told by your doctor. Try:  Breathing in (inhaling) through your nose for 1 second. Then, pucker your lips and breath out (exhale) through your lips for 2  seconds.  Putting one hand on your belly (abdomen). Breathe in slowly through your nose for 1 second. Your hand on your belly should move out. Pucker your lips and breathe out slowly through your lips. Your hand on your belly should move in as you breathe out.  Learn and use controlled coughing to clear thick spit from your lungs. The steps are: 1. Lean your head a little forward. 2. Breathe in deeply. 3. Try to hold your breath for 3 seconds. 4. Keep your mouth slightly open while coughing 2 times. 5. Spit any thick spit out into a tissue. 6. Rest and do the steps again 1 or 2 times as needed. GET HELP IF:  You cough up more thick spit than usual.  There is a change in the color or thickness of the spit.  It is harder to breathe than usual.  Your breathing is faster than usual. GET HELP RIGHT AWAY IF:   You have shortness of breath while resting.  You have shortness of breath that stops you from:  Being able to talk.  Doing normal activities.  You chest hurts for longer than 5 minutes.  Your skin color is more blue than usual.  Your pulse oximeter shows that you have low oxygen for longer than 5 minutes. MAKE SURE YOU:   Understand these instructions.  Will watch your condition.  Will get help right away if you are not doing well or get worse. Document Released: 07/25/2007 Document Revised: 06/22/2013 Document Reviewed: 10/02/2012 [employer] Patient Information  2015 [employer]. This information is not intended to replace advice given to you by your health care provider. Make sure you discuss any questions you have with your health care provider. ° °

## 2014-09-29 NOTE — ED Notes (Signed)
Patient transported to X-ray 

## 2014-09-29 NOTE — ED Notes (Signed)
Amb pt: O2 sats remained at 98% on RA, notified Dr Shaune Pollack pt was ready to leave.

## 2014-09-29 NOTE — ED Notes (Signed)
Lab called for critical value  K+ 2.9  Notified Dr Shaune Pollack.

## 2014-09-30 ENCOUNTER — Observation Stay
Admission: EM | Admit: 2014-09-30 | Discharge: 2014-10-02 | Disposition: A | Discharge status: Home / Self Care | Payer: Medicaid Other | Attending: Internal Medicine | Admitting: Internal Medicine

## 2014-09-30 ENCOUNTER — Encounter: Payer: Self-pay | Admitting: Emergency Medicine

## 2014-09-30 ENCOUNTER — Emergency Department: Payer: Medicaid Other

## 2014-09-30 DIAGNOSIS — B20 Human immunodeficiency virus [HIV] disease: Secondary | ICD-10-CM

## 2014-09-30 DIAGNOSIS — J209 Acute bronchitis, unspecified: Secondary | ICD-10-CM

## 2014-09-30 DIAGNOSIS — Z886 Allergy status to analgesic agent status: Secondary | ICD-10-CM | POA: Insufficient documentation

## 2014-09-30 DIAGNOSIS — R05 Cough: Secondary | ICD-10-CM | POA: Insufficient documentation

## 2014-09-30 DIAGNOSIS — Z8701 Personal history of pneumonia (recurrent): Secondary | ICD-10-CM | POA: Insufficient documentation

## 2014-09-30 DIAGNOSIS — Z880 Allergy status to penicillin: Secondary | ICD-10-CM | POA: Insufficient documentation

## 2014-09-30 DIAGNOSIS — Z79899 Other long term (current) drug therapy: Secondary | ICD-10-CM | POA: Diagnosis not present

## 2014-09-30 DIAGNOSIS — J440 Chronic obstructive pulmonary disease with acute lower respiratory infection: Secondary | ICD-10-CM | POA: Insufficient documentation

## 2014-09-30 DIAGNOSIS — J9621 Acute and chronic respiratory failure with hypoxia: Principal | ICD-10-CM | POA: Insufficient documentation

## 2014-09-30 DIAGNOSIS — K219 Gastro-esophageal reflux disease without esophagitis: Secondary | ICD-10-CM | POA: Insufficient documentation

## 2014-09-30 DIAGNOSIS — J9601 Acute respiratory failure with hypoxia: Secondary | ICD-10-CM

## 2014-09-30 DIAGNOSIS — Z8249 Family history of ischemic heart disease and other diseases of the circulatory system: Secondary | ICD-10-CM | POA: Insufficient documentation

## 2014-09-30 DIAGNOSIS — F4310 Post-traumatic stress disorder, unspecified: Secondary | ICD-10-CM | POA: Insufficient documentation

## 2014-09-30 DIAGNOSIS — F17210 Nicotine dependence, cigarettes, uncomplicated: Secondary | ICD-10-CM | POA: Insufficient documentation

## 2014-09-30 DIAGNOSIS — Z21 Asymptomatic human immunodeficiency virus [HIV] infection status: Secondary | ICD-10-CM | POA: Diagnosis not present

## 2014-09-30 DIAGNOSIS — Z7951 Long term (current) use of inhaled steroids: Secondary | ICD-10-CM | POA: Insufficient documentation

## 2014-09-30 DIAGNOSIS — J449 Chronic obstructive pulmonary disease, unspecified: Secondary | ICD-10-CM

## 2014-09-30 DIAGNOSIS — F419 Anxiety disorder, unspecified: Secondary | ICD-10-CM | POA: Insufficient documentation

## 2014-09-30 DIAGNOSIS — R0902 Hypoxemia: Secondary | ICD-10-CM | POA: Diagnosis present

## 2014-09-30 DIAGNOSIS — E440 Moderate protein-calorie malnutrition: Secondary | ICD-10-CM

## 2014-09-30 DIAGNOSIS — J441 Chronic obstructive pulmonary disease with (acute) exacerbation: Secondary | ICD-10-CM

## 2014-09-30 LAB — CBC WITH DIFFERENTIAL/PLATELET
Basophils Absolute: 0 10*3/uL (ref 0–0.1)
Basophils Relative: 0 %
Eosinophils Absolute: 0 10*3/uL (ref 0–0.7)
Eosinophils Relative: 0 %
HEMATOCRIT: 28.7 % — AB (ref 35.0–47.0)
Hemoglobin: 8.2 g/dL — ABNORMAL LOW (ref 12.0–15.0)
Lymphs Abs: 1.3 10*3/uL (ref 1.0–3.6)
MCH: 19.3 pg — ABNORMAL LOW (ref 26.0–34.0)
MCHC: 28.4 g/dL — ABNORMAL LOW (ref 32.0–36.0)
MCV: 68 fL — ABNORMAL LOW (ref 80.0–100.0)
Monocytes Absolute: 0.4 10*3/uL (ref 0.2–0.9)
Monocytes Relative: 4 %
NEUTROS ABS: 9.9 10*3/uL — AB (ref 1.4–6.5)
PLATELETS: 274 10*3/uL (ref 150–440)
RBC: 4.23 MIL/uL (ref 3.80–5.20)
RDW: 16.6 % — AB (ref 11.5–14.5)
WBC: 11.7 10*3/uL — AB (ref 3.6–11.0)

## 2014-09-30 LAB — BASIC METABOLIC PANEL
ANION GAP: 6 (ref 5–15)
BUN: 12 mg/dL (ref 6–20)
CO2: 28 mmol/L (ref 22–32)
Calcium: 8.4 mg/dL — ABNORMAL LOW (ref 8.9–10.3)
Chloride: 109 mmol/L (ref 101–111)
Creatinine, Ser: 0.8 mg/dL (ref 0.44–1.00)
GFR calc Af Amer: >60 mL/min (ref >60)
GFR calc non Af Amer: >60 mL/min (ref >60)
Glucose, Bld: 120 mg/dL — ABNORMAL HIGH (ref 65–99)
POTASSIUM: 3.4 mmol/L — AB (ref 3.5–5.1)
SODIUM: 143 mmol/L (ref 135–145)

## 2014-09-30 LAB — TROPONIN I

## 2014-09-30 LAB — BRAIN NATRIURETIC PEPTIDE: B Natriuretic Peptide: 280 pg/mL — ABNORMAL HIGH (ref 0.0–100.0)

## 2014-09-30 MED ORDER — ABACAVIR-DOLUTEGRAVIR-LAMIVUD 600-50-300 MG PO TABS: 1.0000 | ORAL_TABLET | Freq: Every day | ORAL | Status: DC

## 2014-09-30 MED ORDER — METHYLPREDNISOLONE SODIUM SUCC 125 MG IJ SOLR
60.0000 mg | Freq: Four times a day (QID) | INTRAMUSCULAR | Status: DC
  Administered 2014-09-30 – 2014-10-02 (×7): 60 mg via INTRAVENOUS
  Filled 2014-09-30 (×7): qty 2

## 2014-09-30 MED ORDER — DEXTROSE 5 % IV SOLN
500.0000 mg | Freq: Once | INTRAVENOUS | Status: AC
  Administered 2014-09-30: 500 mg via INTRAVENOUS
  Filled 2014-09-30: qty 500

## 2014-09-30 MED ORDER — TIOTROPIUM BROMIDE MONOHYDRATE 18 MCG IN CAPS
18.0000 ug | ORAL_CAPSULE | Freq: Every day | RESPIRATORY_TRACT | Status: DC
  Administered 2014-09-30 – 2014-10-02 (×3): 18 ug via RESPIRATORY_TRACT
  Filled 2014-09-30: qty 5

## 2014-09-30 MED ORDER — DOLUTEGRAVIR SODIUM 50 MG PO TABS
50.0000 mg | ORAL_TABLET | Freq: Every day | ORAL | Status: DC
  Administered 2014-09-30 – 2014-10-02 (×3): 50 mg via ORAL
  Filled 2014-09-30 (×3): qty 1

## 2014-09-30 MED ORDER — FERROUS SULFATE 325 (65 FE) MG PO TABS
325.0000 mg | ORAL_TABLET | Freq: Two times a day (BID) | ORAL | Status: DC
  Administered 2014-09-30 – 2014-10-02 (×4): 325 mg via ORAL
  Filled 2014-09-30 (×4): qty 1

## 2014-09-30 MED ORDER — LEVOFLOXACIN 750 MG PO TABS
750.0000 mg | ORAL_TABLET | Freq: Every day | ORAL | Status: DC
  Administered 2014-09-30 – 2014-10-02 (×3): 750 mg via ORAL
  Filled 2014-09-30 (×3): qty 1

## 2014-09-30 MED ORDER — ABACAVIR SULFATE 300 MG PO TABS
600.0000 mg | ORAL_TABLET | Freq: Every day | ORAL | Status: DC
  Administered 2014-09-30: 600 mg via ORAL
  Administered 2014-10-01 (×2): 300 mg via ORAL
  Administered 2014-10-02: 09:00:00 600 mg via ORAL
  Filled 2014-09-30 (×4): qty 2

## 2014-09-30 MED ORDER — IPRATROPIUM-ALBUTEROL 0.5-2.5 (3) MG/3ML IN SOLN: 3.0000 mL | RESPIRATORY_TRACT | Status: DC | PRN

## 2014-09-30 MED ORDER — IPRATROPIUM-ALBUTEROL 0.5-2.5 (3) MG/3ML IN SOLN
3.0000 mL | Freq: Once | RESPIRATORY_TRACT | Status: AC
  Administered 2014-09-30: 3 mL via RESPIRATORY_TRACT
  Filled 2014-09-30: qty 3

## 2014-09-30 MED ORDER — BUDESONIDE 0.25 MG/2ML IN SUSP
0.2500 mg | Freq: Two times a day (BID) | RESPIRATORY_TRACT | Status: DC
  Administered 2014-09-30 – 2014-10-02 (×5): 0.25 mg via RESPIRATORY_TRACT
  Filled 2014-09-30 (×5): qty 2

## 2014-09-30 MED ORDER — OXYCODONE-ACETAMINOPHEN 5-325 MG PO TABS
1.0000 | ORAL_TABLET | Freq: Four times a day (QID) | ORAL | Status: DC | PRN
  Administered 2014-09-30: 1 via ORAL
  Filled 2014-09-30: qty 1

## 2014-09-30 MED ORDER — ALPRAZOLAM 0.25 MG PO TABS: 0.2500 mg | ORAL_TABLET | Freq: Three times a day (TID) | ORAL | Status: DC | PRN

## 2014-09-30 MED ORDER — DIPHENHYDRAMINE HCL 25 MG PO TABS
25.0000 mg | ORAL_TABLET | Freq: Four times a day (QID) | ORAL | Status: DC | PRN
  Filled 2014-09-30: qty 1

## 2014-09-30 MED ORDER — IPRATROPIUM-ALBUTEROL 0.5-2.5 (3) MG/3ML IN SOLN
3.0000 mL | RESPIRATORY_TRACT | Status: DC
  Administered 2014-09-30 (×2): 3 mL via RESPIRATORY_TRACT
  Filled 2014-09-30 (×2): qty 3

## 2014-09-30 MED ORDER — IPRATROPIUM-ALBUTEROL 0.5-2.5 (3) MG/3ML IN SOLN
3.0000 mL | Freq: Four times a day (QID) | RESPIRATORY_TRACT | Status: DC
  Administered 2014-10-01 – 2014-10-02 (×5): 3 mL via RESPIRATORY_TRACT
  Filled 2014-09-30 (×6): qty 3

## 2014-09-30 MED ORDER — LAMIVUDINE 150 MG PO TABS
300.0000 mg | ORAL_TABLET | Freq: Every day | ORAL | Status: DC
  Administered 2014-09-30 – 2014-10-02 (×3): 300 mg via ORAL
  Filled 2014-09-30 (×3): qty 2

## 2014-09-30 MED ORDER — DULOXETINE HCL 60 MG PO CPEP
60.0000 mg | ORAL_CAPSULE | Freq: Every day | ORAL | Status: DC
Start: 2014-09-30 — End: 2014-10-02
  Administered 2014-09-30 – 2014-10-02 (×3): 60 mg via ORAL
  Filled 2014-09-30 (×4): qty 1

## 2014-09-30 MED ORDER — PANTOPRAZOLE SODIUM 40 MG PO TBEC
40.0000 mg | DELAYED_RELEASE_TABLET | Freq: Every day | ORAL | Status: DC
  Administered 2014-09-30 – 2014-10-02 (×3): 40 mg via ORAL
  Filled 2014-09-30 (×3): qty 1

## 2014-09-30 NOTE — ED Provider Notes (Signed)
[facility] Emergency Department Provider Note  ____________________________________________  Time seen: 11:45 AM  I have reviewed the triage vital signs and the nursing notes.   HISTORY  Chief Complaint Respiratory Distress     HPI Whitney [name] is a 44 y.o. female who is seen in the emergency department yesterday for COPD exacerbation and shortness of breath. She had a chest x-ray which showed some hyperinflation but no infiltrate and was subsequently discharged home with a prescription for prednisone. Apparently she was already on antibiotics.  Today, she had worsening shortness of breath. EMS was called, and upon their arrival, they noted a oxygen saturation level around 84%. The patient does not use home O2. They initiated oxygen, breathing treatments, Solu-Medrol, and magnesium, 2 g IV. Her oxygen saturation levels improved some. In route to the ambulance, with the oxygen off, her O2 sats dropped again to approximately 87%. At this time she continues on a nonrebreather and is feeling more comfortable.  The patient is noted to be HIV positive. She apparently is compliant with the medications and by recent emergency department reports, she has no notable viral load (that is by history, not by ED labs).    Past Medical History  Diagnosis Date  . COPD (chronic obstructive pulmonary disease)   . HIV disease   . Anxiety   . PTSD (post-traumatic stress disorder)   . GERD (gastroesophageal reflux disease)     Patient Active Problem List   Diagnosis Date Noted  . COPD exacerbation 09/30/2014  . PNA (pneumonia) 09/11/2014    Past Surgical History  Procedure Laterality Date  . Cesarean section    . Tubal ligation      Current Outpatient Rx  Name  Route  Sig  Dispense  Refill  . Abacavir-Dolutegravir-Lamivud (TRIUMEQ) 600-50-300 MG TABS   Oral   Take 1 tablet by mouth daily.         Marland Kitchen albuterol (PROVENTIL HFA;VENTOLIN HFA) 108 (90 BASE)  MCG/ACT inhaler   Inhalation   Inhale 1-2 puffs into the lungs every 6 (six) hours as needed for wheezing or shortness of breath.          Marland Kitchen albuterol (PROVENTIL) (2.5 MG/3ML) 0.083% nebulizer solution   Nebulization   Take 2.5 mg by nebulization every 6 (six) hours as needed for wheezing or shortness of breath.         . diphenhydrAMINE (BENADRYL) 25 MG tablet   Oral   Take 25 mg by mouth every 6 (six) hours as needed for allergies.         . DULoxetine (CYMBALTA) 30 MG capsule   Oral   Take 60 mg by mouth daily.         . Fluticasone-Salmeterol (ADVAIR) 250-50 MCG/DOSE AEPB   Inhalation   Inhale 1 puff into the lungs 2 (two) times daily.         Marland Kitchen omeprazole (PRILOSEC) 40 MG capsule   Oral   Take 40 mg by mouth 2 (two) times daily.         . ferrous sulfate 325 (65 FE) MG tablet   Oral   Take 1 tablet (325 mg total) by mouth 2 (two) times daily with a meal. Patient not taking: Reported on 09/30/2014   60 tablet   0   . levofloxacin (LEVAQUIN) 750 MG tablet   Oral   Take 1 tablet (750 mg total) by mouth daily. Patient not taking: Reported on 09/29/2014   6 tablet  0   . predniSONE (DELTASONE) 10 MG tablet   Oral   Take 5 tablets (50 mg total) by mouth daily. Patient not taking: Reported on 09/30/2014   20 tablet   0     Allergies Penicillins and Aspirin  Family History  Problem Relation Age of Onset  . Parkinson's disease Father   . CAD Father   . Hypertension Mother     Social History Social History  Substance Use Topics  . Smoking status: Current Every Day Smoker -- 0.50 packs/day    Types: Cigarettes  . Smokeless tobacco: None  . Alcohol Use: No    Review of Systems  Constitutional: Negative for fever. ENT: Negative for sore throat. Cardiovascular: Negative for chest pain. Respiratory: Notable for hypoxia and shortness of breath today. See HPI. Gastrointestinal: Negative for abdominal pain, vomiting and diarrhea. Genitourinary:  Negative for dysuria. Musculoskeletal: No myalgias or injuries. Skin: Negative for rash. Neurological: Negative for headaches   10-point ROS otherwise negative.  ____________________________________________   PHYSICAL EXAM:  VITAL SIGNS: ED Triage Vitals  Enc Vitals Group     BP --      Pulse --      Resp --      Temp --      Temp src --      SpO2 --      Weight --      Height --      Head Cir --      Peak Flow --      Pain Score --      Pain Loc --      Pain Edu? --      Excl. in GC? --     Constitutional:  Alert, appears oriented. She is communicative. She is able to provide a decent history. She continues on a nonrebreather mask and appears to be in no acute distress at this time. ENT   Head: Normocephalic and atraumatic.   Nose: No congestion/rhinnorhea.   Mouth/Throat: Mucous membranes are moist. Cardiovascular: Normal rate, regular rhythm, no murmur noted Respiratory:  Mild tachypnea. Overall normal work of breathing at this time.    Decreased breath sounds but no wheezing..  Gastrointestinal: Soft and nontender. No distention.  Back: No muscle spasm, no tenderness, no CVA tenderness. Musculoskeletal: No deformity noted. Nontender with normal range of motion in all extremities.  No noted edema. Neurologic:  Normal speech and language. No gross focal neurologic deficits are appreciated.  Skin:  Skin is warm, dry. No rash noted. Psychiatric: Mood and affect are normal. Speech and behavior are normal.  ____________________________________________    LABS (pertinent positives/negatives)  Labs Reviewed  CBC WITH DIFFERENTIAL/PLATELET - Abnormal; Notable for the following:    WBC 11.7 (*)    Hemoglobin 8.2 (*)    HCT 28.7 (*)    MCV 68.0 (*)    MCH 19.3 (*)    MCHC 28.4 (*)    RDW 16.6 (*)    Neutro Abs 9.9 (*)    All other components within normal limits  BASIC METABOLIC PANEL - Abnormal; Notable for the following:    Potassium 3.4 (*)     Glucose, Bld 120 (*)    Calcium 8.4 (*)    All other components within normal limits  BRAIN NATRIURETIC PEPTIDE - Abnormal; Notable for the following:    B Natriuretic Peptide 280.0 (*)    All other components within normal limits  CULTURE, BLOOD (ROUTINE X 2)  CULTURE, BLOOD (ROUTINE X  2)  TROPONIN I     ____________________________________________   EKG  ED ECG REPORT I, Albion Weatherholtz W, the attending physician, personally viewed and interpreted this ECG.   Date: 09/30/2014  EKG Time: 12:19 PM  Rate: 98  Rhythm: Normal sinus rhythm  Axis: Normal  Intervals: QTC of 5:15  ST&T Change: None noted   ____________________________________________    RADIOLOGY  Chest x-ray:  IMPRESSION: COPD with superimposed acute bronchitis. There has not been significant change since yesterday's study. There is no pneumonia nor CHF. If the patient's symptoms do not respond to anticipated antibiotic therapy, chest CT scanning may be a useful next imaging step.  ____________________________________________  CRITICAL CARE Performed by: Darien Ramus   Total critical care time: 30 minutes due to the patient's critical condition with hypoxia and oxygen dependence. This included time for initial assessment, management, and discussion with consultants [facility].  Critical care time was exclusive of separately billable procedures and treating other patients.  Critical care was necessary to treat or prevent imminent or life-threatening deterioration.  Critical care was time spent personally by me on the following activities: development of treatment plan with patient and/or surrogate as well as nursing, discussions with consultants, evaluation of patient's response to treatment, examination of patient, obtaining history from patient or surrogate, ordering and performing treatments and interventions, ordering and review of laboratory studies, ordering and review of  radiographic studies, pulse oximetry and re-evaluation of patient's condition.  ____________________________________________   INITIAL IMPRESSION / ASSESSMENT AND PLAN / ED COURSE  Pertinent labs & imaging results that were available during my care of the patient were reviewed by me and considered in my medical decision making (see chart for details).  44 year old female with significant lung disease as well as eating positive for HIV without aids.  The patient has worsened dyspnea today after being diagnosed with a COPD exacerbation yesterday. She was hypoxic both with EMS as well as upon arrival in the emergency department. She needs to be admitted for ongoing care. We will initiate further antibiotic treatment at this time and continue supportive care with bronchodilators and oxygen.  ____________________________________________   FINAL CLINICAL IMPRESSION(S) / ED DIAGNOSES  Final diagnoses:  COPD with exacerbation  Hypoxia  Acute on chronic respiratory failure with hypoxia      Darien Ramus, MD 09/30/14 1549

## 2014-09-30 NOTE — Plan of Care (Signed)
Problem: Discharge Progression Outcomes Goal: Dyspnea controlled Individualization: From home lives with adult son, smoker 10cigs/day, not currently on home o2, moderate fall risk, history of HTN, anxiety controlled by home meds, history of COPD  Outcome: Progressing Patient complained only once of shortness of breath when she first came to floor, which was relieved with treatment from respiratory  Patient of sat is 100% on 2 liters N/C Patient is a independent in ADL's  Patient c/o right sided chest soreness x1 relieved with prn percocet Patient is tolerating diet well VSS

## 2014-09-30 NOTE — ED Notes (Signed)
Pt to ED from home via EMS c/o respiratory distress.  Pt seen here yesterday dx with right sided pneumonia.  Pt states difficulty breathing since yesterday.  Pt states generalized chest tightness.  Pt states less distressed since having NRB on.  Pt A&Ox4, speaking in complete and coherent sentences, crackles heard throughout lungs.

## 2014-09-30 NOTE — H&P (Signed)
[facility] - [place] at [facility]   PATIENT NAME: Whitney [name]    MR#:  [id number]  DATE OF BIRTH:  07/22/1970  DATE OF ADMISSION:  09/30/2014  PRIMARY CARE PHYSICIAN: Shaune Leeks, MD   REQUESTING/REFERRING PHYSICIAN: [email or web address]  CHIEF COMPLAINT:   Chief Complaint  Patient presents with  . Respiratory Distress    HISTORY OF PRESENT ILLNESS: Whitney [name]  is a 44 y.o. female with a known history of COPD, HIV disease, anxiety, gastroesophageal reflux disease, was admitted to hospital in last week of July for pneumonia and was discharged home with antibiotic. After that she recovered a little bit but for last 2-3 days she again started having chest tightness and shortness of breath with some cough, she came to emergency room yesterday and was sent home with Levaquin and prednisone prescriptions. She did not refill the prescriptions yet and so did not go to any of her treatment. Today morning again she had episode of severe shortness of breath and she was not able to breathe and feeling chest tightness and with that she also lost control on her bowel and bladder and see just mess up herself, so her family called EMS and as per them when they reached she was very short of breath so they started on oxygen therapy and brought her to emergency room. In ER she was given treatment for COPD was still continued to have rapid breathing so given to hospitalist admission. She said she has run out of her nebulizer machine solutions.   PAST MEDICAL HISTORY:   Past Medical History  Diagnosis Date  . COPD (chronic obstructive pulmonary disease)   . HIV disease   . Anxiety   . PTSD (post-traumatic stress disorder)   . GERD (gastroesophageal reflux disease)     PAST SURGICAL HISTORY:  Past Surgical History  Procedure Laterality Date  . Cesarean section    . Tubal ligation      SOCIAL HISTORY:  Social History  Substance Use Topics  . Smoking status: Current  Every Day Smoker -- 0.50 packs/day    Types: Cigarettes  . Smokeless tobacco: Not on file  . Alcohol Use: No    FAMILY HISTORY:  Family History  Problem Relation Age of Onset  . Parkinson's disease Father   . CAD Father   . Hypertension Mother     DRUG ALLERGIES:  Allergies  Allergen Reactions  . Penicillins Hives  . Aspirin Rash and Other (See Comments)    Reaction:  GI upset     REVIEW OF SYSTEMS:   CONSTITUTIONAL: No fever, fatigue or weakness.  EYES: No blurred or double vision.  EARS, NOSE, AND THROAT: No tinnitus or ear pain.  RESPIRATORY:Positive for  Cough and  shortness of breath,  No wheezing or hemoptysis.  CARDIOVASCULAR: No chest pain, orthopnea, edema.  GASTROINTESTINAL: No nausea, vomiting, diarrhea or abdominal pain.  GENITOURINARY: No dysuria, hematuria.  ENDOCRINE: No polyuria, nocturia,  HEMATOLOGY: No anemia, easy bruising or bleeding SKIN: No rash or lesion. MUSCULOSKELETAL: No joint pain or arthritis.   NEUROLOGIC: No tingling, numbness, weakness.  PSYCHIATRY:She have anxiety  Anxiety, no depression.   MEDICATIONS AT HOME:  Prior to Admission medications   Medication Sig Start Date End Date Taking? Authorizing Provider  Abacavir-Dolutegravir-Lamivud (TRIUMEQ) 600-50-300 MG TABS Take 1 tablet by mouth daily.   Yes Historical Provider, MD  albuterol (PROVENTIL HFA;VENTOLIN HFA) 108 (90 BASE) MCG/ACT inhaler Inhale 1-2 puffs into the lungs every 6 (six) hours  as needed for wheezing or shortness of breath.    Yes Historical Provider, MD  albuterol (PROVENTIL) (2.5 MG/3ML) 0.083% nebulizer solution Take 2.5 mg by nebulization every 6 (six) hours as needed for wheezing or shortness of breath.   Yes Historical Provider, MD  diphenhydrAMINE (BENADRYL) 25 MG tablet Take 25 mg by mouth every 6 (six) hours as needed for allergies.   Yes Historical Provider, MD  DULoxetine (CYMBALTA) 30 MG capsule Take 60 mg by mouth daily.   Yes Historical Provider, MD   Fluticasone-Salmeterol (ADVAIR) 250-50 MCG/DOSE AEPB Inhale 1 puff into the lungs 2 (two) times daily.   Yes Historical Provider, MD  omeprazole (PRILOSEC) 40 MG capsule Take 40 mg by mouth 2 (two) times daily.   Yes Historical Provider, MD  ferrous sulfate 325 (65 FE) MG tablet Take 1 tablet (325 mg total) by mouth 2 (two) times daily with a meal. Patient not taking: Reported on 09/30/2014 09/12/14   [name], MD  levofloxacin (LEVAQUIN) 750 MG tablet Take 1 tablet (750 mg total) by mouth daily. Patient not taking: Reported on 09/29/2014 09/12/14   [name], MD  predniSONE (DELTASONE) 10 MG tablet Take 5 tablets (50 mg total) by mouth daily. Patient not taking: Reported on 09/30/2014 09/29/14   Governor Rooks, MD      PHYSICAL EXAMINATION:   VITAL SIGNS: Blood pressure 113/92, pulse 99, temperature 98.3 F (36.8 C), temperature source Oral, resp. rate 23, height  (1.626 m), weight 46.267 kg (102 lb), last menstrual period 08/29/2014, SpO2 100 %.  GENERAL:  44 y.o.-year-old thin patient lying in the bed with no acute distress.  EYES: Pupils equal, round, reactive to light and accommodation. No scleral icterus. Extraocular muscles intact.  HEENT: Head atraumatic, normocephalic. Oropharynx and nasopharynx clear.  NECK:  Supple, no jugular venous distention. No thyroid enlargement, no tenderness.  LUNGS: Normal breath sounds bilaterally,mild wheezing, no  crepitation.She have rapid breathing, and using accessory muscles of respiration.  CARDIOVASCULAR: S1, S2 normal. No murmurs, rubs, or gallops.  ABDOMEN: Soft, nontender, nondistended. Bowel sounds present. No organomegaly or mass.  EXTREMITIES: No pedal edema, cyanosis, or clubbing.  NEUROLOGIC: Cranial nerves II through XII are intact. Muscle strength 5/5 in all extremities. Sensation intact. Gait not checked.  PSYCHIATRIC: The patient is alert and oriented x 3. Appears anxious.  SKIN: No obvious rash, lesion, or ulcer.   LABORATORY  PANEL:   CBC  Recent Labs Lab 09/29/14 0824 09/30/14 0015  WBC 7.4 11.7*  HGB 7.8* 8.2*  HCT 27.5* 28.7*  PLT 219 274  MCV 67.8* 68.0*  MCH 19.2* 19.3*  MCHC 28.4* 28.4*  RDW 16.4* 16.6*  LYMPHSABS 1.1 1.3  MONOABS 0.4 0.4  EOSABS 0.1 0.0  BASOSABS 0.0 0.0   ------------------------------------------------------------------------------------------------------------------  Chemistries   Recent Labs Lab 09/29/14 0824 09/30/14 0015  NA 142 143  K 2.9* 3.4*  CL 105 109  CO2 27 28  GLUCOSE 134* 120*  BUN 11 12  CREATININE 0.85 0.80  CALCIUM 8.6* 8.4*  AST 186*  --   ALT 86*  --   ALKPHOS 72  --   BILITOT 0.3  --    ------------------------------------------------------------------------------------------------------------------ estimated creatinine clearance is 64.2 mL/min (by C-G formula based on Cr of 0.8). ------------------------------------------------------------------------------------------------------------------ No results for input(s): TSH, T4TOTAL, T3FREE, THYROIDAB in the last 72 hours.  Invalid input(s): FREET3   Coagulation profile No results for input(s): INR, PROTIME in the last 168 hours. ------------------------------------------------------------------------------------------------------------------- No results for input(s): DDIMER in the  last 72 hours. -------------------------------------------------------------------------------------------------------------------  Cardiac Enzymes  Recent Labs Lab 09/29/14 0324 09/30/14 0015  TROPONINI <0.03 <0.03   ------------------------------------------------------------------------------------------------------------------ Invalid input(s): POCBNP  ---------------------------------------------------------------------------------------------------------------  Urinalysis No results found for: COLORURINE, APPEARANCEUR, LABSPEC, PHURINE, GLUCOSEU, HGBUR, BILIRUBINUR, KETONESUR, PROTEINUR,  UROBILINOGEN, NITRITE, LEUKOCYTESUR   RADIOLOGY: Dg Chest 2 View  09/29/2014   CLINICAL DATA:  Pneumonia, shortness of Breath  EXAM: CHEST  2 VIEW  COMPARISON:  09/11/2014  FINDINGS: Cardiomediastinal silhouette is stable. Mild hyperinflation. Central mild bronchitic changes. No segmental infiltrate or pulmonary edema.  IMPRESSION: Mild hyperinflation. Central mild bronchitic changes. No focal infiltrate or pulmonary edema.   Electronically Signed   By: Natasha Mead M.D.   On: 09/29/2014 08:44   Dg Chest Port 1 View  09/30/2014   CLINICAL DATA:  Shortness of breath, history of COPD, HIV, and current smoker.  EXAM: PORTABLE CHEST - 1 VIEW  COMPARISON:  PA and lateral chest of September 29, 2014  FINDINGS: The lungs remain hyperinflated. The interstitial markings remain mildly increased. There is no alveolar infiltrate. There is no pleural effusion. The heart is normal in size. The pulmonary vascularity is not engorged.  IMPRESSION: COPD with superimposed acute bronchitis. There has not been significant change since yesterday's study. There is no pneumonia nor CHF. If the patient's symptoms do not respond to anticipated antibiotic therapy, chest CT scanning may be a useful next imaging step.   Electronically Signed   By: David  Swazil[place] M.D.   On: 09/30/2014 13:52    IMPRESSION AND PLAN:  * COPD exacerbation  IV steroid, nebulizer therapy, Spiriva, oxygen supplementation, Levaquin.  Continue monitoring oxygen saturation, and check for oxygen requirement on discharge at home.  No signs of infection, or pneumonia.  * Anxiety We'll give Xanax as needed basis.  * HIV   As per the previous note from admission 2 weeks ago she had her viral load undetectable .  I will continue her home medications for HIV.   * Smoking   Counseled to quit smoking for 4 minutes she would like to and including gum for that.    All the records are reviewed and case discussed with ED provider. Management plans discussed  with the patient, family and they are in agreement.  CODE STATUS:Full code    TOTAL TIME TAKING CARE OF THIS PATIENT: 50  minutes.    Altamese Dilling M.D on 09/30/2014   Between 7am to 6pm - Pager - [phone]  After 6pm go to [email or web address] - password [email or web address] [facility]  [facility]  Office  [phone]  CC: Primary care physician; Shaune Leeks, MD

## 2014-09-30 NOTE — Progress Notes (Signed)
Patient had left her room and was found at outside of visitors entrance smoking.   Let patient know that smoking was not allowed on facility grounds and I could call and ask MD for nicotine replacement if she needed it, she declined.

## 2014-09-30 NOTE — Progress Notes (Signed)
   09/30/14 1650  Clinical Encounter Type  Visited With Patient;Patient and family together  Visit Type Initial  Referral From Nurse  Consult/Referral To Chaplain  Spiritual Encounters  Spiritual Needs Emotional  Stress Factors  Patient Stress Factors Health changes  Family Stress Factors None identified  Chaplain visited with patient and provided emotional support.   Chaplain Meliah Appleman [phone]

## 2014-10-01 DIAGNOSIS — E440 Moderate protein-calorie malnutrition: Secondary | ICD-10-CM

## 2014-10-01 LAB — BASIC METABOLIC PANEL
Anion gap: 5 (ref 5–15)
BUN: 10 mg/dL (ref 6–20)
CALCIUM: 9.3 mg/dL (ref 8.9–10.3)
CO2: 30 mmol/L (ref 22–32)
CREATININE: 0.6 mg/dL (ref 0.44–1.00)
Chloride: 105 mmol/L (ref 101–111)
GFR calc Af Amer: >60 mL/min (ref >60)
GFR calc non Af Amer: >60 mL/min (ref >60)
GLUCOSE: 134 mg/dL — AB (ref 65–99)
Potassium: 4.6 mmol/L (ref 3.5–5.1)
Sodium: 140 mmol/L (ref 135–145)

## 2014-10-01 LAB — CBC
HEMATOCRIT: 27.6 % — AB (ref 35.0–47.0)
Hemoglobin: 8 g/dL — ABNORMAL LOW (ref 12.0–16.0)
MCH: 19.5 pg — ABNORMAL LOW (ref 26.0–34.0)
MCHC: 28.9 g/dL — AB (ref 32.0–36.0)
MCV: 67.4 fL — ABNORMAL LOW (ref 80.0–100.0)
Platelets: 224 10*3/uL (ref 150–440)
RBC: 4.1 MIL/uL (ref 3.80–5.20)
RDW: 16.7 % — ABNORMAL HIGH (ref 11.5–14.5)
WBC: 7.2 10*3/uL (ref 3.6–11.0)

## 2014-10-01 MED ORDER — ENSURE ENLIVE PO LIQD
237.0000 mL | Freq: Two times a day (BID) | ORAL | Status: DC
  Administered 2014-10-01 – 2014-10-02 (×3): 237 mL via ORAL

## 2014-10-01 NOTE — Progress Notes (Signed)
Initial Nutrition Assessment  DOCUMENTATION CODES:   Non-severe (moderate) malnutrition in context of chronic illness  INTERVENTION:   Meals and Snacks: Cater to patient preferences Medical Food Supplement Therapy: will recommend Ensure Enlive po BID, each supplement provides 350 kcal and 20 grams of protein   NUTRITION DIAGNOSIS:   Increased nutrient needs related to chronic illness as evidenced by estimated needs.  GOAL:   Patient will meet greater than or equal to 90% of their needs  MONITOR:    (Energy Intake, Anthropometrics, Electrolyte and Renal Profile, Anthropometrics)  REASON FOR ASSESSMENT:   Consult Poor PO  ASSESSMENT:   Pt admitted with SOB secondary to COPD exacerbation, admission a couple of weeks ago with pna. Pt with h/o HIV.  Past Medical History  Diagnosis Date  . COPD (chronic obstructive pulmonary disease)   . HIV disease   . Anxiety   . PTSD (post-traumatic stress disorder)   . GERD (gastroesophageal reflux disease)      Diet Order:  Diet regular Room service appropriate?: Yes; Fluid consistency:: Thin   Current Nutrition: Pt eating breakfast this am, only eaten about 50% secondary to MD visit and Nsg working with pt.   Food/Nutrition-Related History: Pt reports over the past few days not eating hardly at all, but did report tolerating food last night. Pt reports difficulty breathing making it difficult to eat. Pt reports prior to the past few days appetite was generally ok, eating 3 meals per day. Pt reports having Ensure/Boost in the past but not consistently.   Medications: Ferrous sulfate, solumedrol, protonix  Electrolyte/Renal Profile and Glucose Profile:   Recent Labs Lab 09/29/14 0824 09/30/14 0015 10/01/14 0504  NA 142 143 140  K 2.9* 3.4* 4.6  CL 105 109 105  CO2 BUN CREATININE 0.85 0.80 0.60  CALCIUM 8.6* 8.4* 9.3  GLUCOSE 134* 120* 134*   Protein Profile:  Recent Labs Lab 09/29/14 0824   ALBUMIN 3.9    Gastrointestinal Profile: Last BM:  09/30/2014  Skin:  Reviewed, no issues  Nutrition-Focused Physical Exam Findings: Nutrition-Focused physical exam completed. Findings are moderate fat depletion, moderate muscle depletion, and no edema.     Weight Change: Pt with 6% weight loss in a few weeks and 18-20% weight loss in 6 months (pt reported weight of 124-128lbs in March 2016)   Height:   Ht Readings from Last 1 Encounters:  09/30/14  (1.626 m)    Weight:   Wt Readings from Last 1 Encounters:  09/30/14 102 lb (46.267 kg)    Ideal Body Weight:   54.6kg  BMI:  Body mass index is 17.5 kg/(m^2).  Estimated Nutritional Needs:   Kcal:  1826-2131kcals, BEE: 1171kcals, TEE; (IF 1.2-1.4)(AF 1.3) using IBW of 54.6kg  Protein:  60-70g Protein (1.1-1.3g/kg) using IBW of 54.6kg  Fluid:  1365-1697mL of fluid (25-69mL/kg) using IBW of 54.6kg  EDUCATION NEEDS:   No education needs identified at this time   HIGH Care Level  Leda Quail, RD, LDN Pager [phone]

## 2014-10-01 NOTE — Plan of Care (Signed)
Problem: Discharge Progression Outcomes Goal: Dyspnea controlled Individualization: From home lives with adult son, smoker 10cigs/day, not currently on home o2, moderate fall risk, history of HTN, anxiety controlled by home meds, history of COPD  Outcome: Progressing Pt weaned to r/a today.pt  Maintained 99% on r/a. Pt amb several times off  Unit.  Instructed to  Stay  On unit. Some sob/ dyspnea with  Exertion  But maintained  High 90s with sats    Goal: Activity appropriate for discharge plan Outcome: Progressing See above note Goal: Hemodynamically stable Outcome: Progressing Solumedrol cont.

## 2014-10-01 NOTE — Progress Notes (Signed)
[facility] Phys[facility] - [place] at [facility]   PATIENT NAME: Whitney [name]    MR#:  [id number]  DATE OF BIRTH:  04/19/1970  SUBJECTIVE:  CHIEF COMPLAINT:   Chief Complaint  Patient presents with  . Respiratory Distress   the patient is 44 year old Caucasian female with a known history of COPD, HIV, anxiety, gastroesophageal reflux disease who presents to the hospital with chest tightness, shortness of breath and cough. Apparently she was seen in the emergency room a day ago was sent home on levofloxacin as well as prednisone. She did not fill those prescriptions and presented back. This is severe shortness of breath and wheezing, also hypoxia, so she was started on oxygen therapy and admitted. Today she feels much better. She states that she is able to cough up some phlegm which is whitish in color. Her wheezing has subsided as well as shortness of breath. She still remains on 2 L of oxygen through nasal cannula Review of Systems  Constitutional: Negative for fever, chills and weight loss.  HENT: Negative for congestion.   Eyes: Negative for blurred vision and double vision.  Respiratory: Positive for cough, sputum production, shortness of breath and wheezing.   Cardiovascular: Negative for chest pain, palpitations, orthopnea, leg swelling and PND.  Gastrointestinal: Negative for nausea, vomiting, abdominal pain, diarrhea, constipation and blood in stool.  Genitourinary: Negative for dysuria, urgency, frequency and hematuria.  Musculoskeletal: Negative for falls.  Neurological: Negative for dizziness, tremors, focal weakness and headaches.  Endo/Heme/Allergies: Does not bruise/bleed easily.  Psychiatric/Behavioral: Negative for depression. The patient does not have insomnia.     VITAL SIGNS: Blood pressure 141/93, pulse 95, temperature 98.2 F (36.8 C), temperature source Oral, resp. rate 20, height 5\' 4"  (1.626 m), weight 46.267 kg (102 lb), last menstrual period  08/29/2014, SpO2 100 %.  PHYSICAL EXAMINATION:   GENERAL:  43 y.o.-year-old patient lying in the bed with no acute distress. Somewhat restless and uncomfortable EYES: Pupils equal, round, reactive to light and accommodation. No scleral icterus. Extraocular muscles intact.  HEENT: Head atraumatic, normocephalic. Oropharynx and nasopharynx clear.  NECK:  Supple, no jugular venous distention. No thyroid enlargement, no tenderness.  LUNGS: Markedly diminished breath sounds bilaterally, bilateral wheezing, some rhonchi , no crepitation. No use of accessory muscles of respiration. Shortness of breath on exertion CARDIOVASCULAR: S1, S2 normal. No murmurs, rubs, or gallops.  ABDOMEN: Soft, nontender, nondistended. Bowel sounds present. No organomegaly or mass.  EXTREMITIES: No pedal edema, cyanosis, or clubbing.  NEUROLOGIC: Cranial nerves II through XII are intact. Muscle strength 5/5 in all extremities. Sensation intact. Gait not checked.  PSYCHIATRIC: The patient is alert and oriented x 3.  SKIN: No obvious rash, lesion, or ulcer.   ORDERS/RESULTS REVIEWED:   CBC  Recent Labs Lab 09/29/14 0824 09/30/14 0015 10/01/14 0504  WBC 7.4 11.7* 7.2  HGB 7.8* 8.2* 8.0*  HCT 27.5* 28.7* 27.6*  PLT 219 274 224  MCV 67.8* 68.0* 67.4*  MCH 19.2* 19.3* 19.5*  MCHC 28.4* 28.4* 28.9*  RDW 16.4* 16.6* 16.7*  LYMPHSABS 1.1 1.3  --   MONOABS 0.4 0.4  --   EOSABS 0.1 0.0  --   BASOSABS 0.0 0.0  --    ------------------------------------------------------------------------------------------------------------------  Chemistries   Recent Labs Lab 09/29/14 0824 09/30/14 0015 10/01/14 0504  NA 142 143 140  K 2.9* 3.4* 4.6  CL 105 109 105  CO2 27 28 30   GLUCOSE 134* 120* 134*  BUN 11 12 10   CREATININE 0.85 0.80  0.60  CALCIUM 8.6* 8.4* 9.3  AST 186*  --   --   ALT 86*  --   --   ALKPHOS 72  --   --   BILITOT 0.3  --   --     ------------------------------------------------------------------------------------------------------------------ estimated creatinine clearance is 64.2 mL/min (by C-G formula based on Cr of 0.6). ------------------------------------------------------------------------------------------------------------------ No results for input(s): TSH, T4TOTAL, T3FREE, THYROIDAB in the last 72 hours.  Invalid input(s): FREET3  Cardiac Enzymes  Recent Labs Lab 09/29/14 0324 09/30/14 0015  TROPONINI <0.03 <0.03   ------------------------------------------------------------------------------------------------------------------ Invalid input(s): POCBNP ---------------------------------------------------------------------------------------------------------------  RADIOLOGY: Dg Chest Port 1 View  09/30/2014   CLINICAL DATA:  Shortness of breath, history of COPD, HIV, and current smoker.  EXAM: PORTABLE CHEST - 1 VIEW  COMPARISON:  PA and lateral chest of September 29, 2014  FINDINGS: The lungs remain hyperinflated. The interstitial markings remain mildly increased. There is no alveolar infiltrate. There is no pleural effusion. The heart is normal in size. The pulmonary vascularity is not engorged.  IMPRESSION: COPD with superimposed acute bronchitis. There has not been significant change since yesterday's study. There is no pneumonia nor CHF. If the patient's symptoms do not respond to anticipated antibiotic therapy, chest CT scanning may be a useful next imaging step.   Electronically Signed   By: David  [place] M.D.   On: 09/30/2014 13:52    EKG:  Orders placed or performed during the hospital encounter of 09/30/14  . ED EKG  . ED EKG  . EKG 12-Lead  . EKG 12-Lead    ASSESSMENT AND PLAN:  Principal Problem:   COPD exacerbation Active Problems:   Malnutrition of moderate degree 1. Acute respiratory failure with hypoxia with oxygen saturations of 87% on room air In emergency room, continue oxygen  therapy, wean her off oxygen to room air as tolerated 2. COPD exacerbation. Continue steroids, inhalation therapy, nebulizers, improving 3. Acute bronchitis due to unknown etiologic agent at present. Continue therapy with levofloxacin. Get sputum cultures if possible  4. Tobacco abuse. Discussed this patient for approximately 4 minutes. Nicotine replacement therapy was recommended. Patient is agreeable to stop smoking 5. HIV. Continue outpatient medications. No changes 6. History of anxiety. Continue therapy with Xanax as needed.   Management plans discussed with the patient, family and they are in agreement.   DRUG ALLERGIES:  Allergies  Allergen Reactions  . Penicillins Hives  . Aspirin Rash and Other (See Comments)    Reaction:  GI upset     CODE STATUS:     Code Status Orders        Start     Ordered   09/30/14 1557  Full code   Continuous     09/30/14 1557      TOTAL TIME TAKING CARE OF THIS PATIENT: 40 minutes  .    [name] M.D on 10/01/2014 at 2:23 PM  Between 7am to 6pm - Pager - [phone]  After 6pm go to [email or web address] - password [email or web address] [facility]  [place] [facility]  Office  [phone]  CC: Primary care physician; Shaune Leeks, MD

## 2014-10-01 NOTE — Progress Notes (Signed)
Paged and spoke with Dr. Sheryle Hail about pt BP. Per Dr. Sheryle Hail, not much can be done about the diastolic BP so just monitor pt.

## 2014-10-01 NOTE — Progress Notes (Signed)
Patient unable to produce sputum specimen at this time, cup left in room.

## 2014-10-01 NOTE — Plan of Care (Signed)
Problem: Discharge Progression Outcomes Goal: Other Discharge Outcomes/Goals Outcome: Progressing Plan of Care Progress to Goal:   Pt report having SOB walking to bathroom and carrying cardiac monitor. BSC was placed at bedside to help with breathing. Pt denies pain. Pt is still sinus tach and BP is elevated. No other signs of distress noted. Will continue to monitor.

## 2014-10-01 NOTE — Care Management (Addendum)
Admitted to [facility] with the diagnosis of COPD. Discharged from this facility 09/29/14. Lives with son, Aurelio Brash, [phone]). No home Health. No skilled facility. Uses no oxygen in the home. Would like to have home oxygen. Explained that she would need to qualify for oxygen before she could have it in the home.Uses no aids for ambulation. Takes care of all activities of daily living herself. Seen Dr. Jessy Oto at [facility] last March. Family will transport. During rounds recommended COPD GOLD program. Gwenette Greet RN MSN Care Management [phone]

## 2014-10-02 DIAGNOSIS — Z21 Asymptomatic human immunodeficiency virus [HIV] infection status: Secondary | ICD-10-CM

## 2014-10-02 DIAGNOSIS — B20 Human immunodeficiency virus [HIV] disease: Secondary | ICD-10-CM

## 2014-10-02 DIAGNOSIS — J209 Acute bronchitis, unspecified: Secondary | ICD-10-CM

## 2014-10-02 DIAGNOSIS — J9601 Acute respiratory failure with hypoxia: Secondary | ICD-10-CM

## 2014-10-02 LAB — EXPECTORATED SPUTUM ASSESSMENT W GRAM STAIN, RFLX TO RESP C: Special Requests: NORMAL

## 2014-10-02 LAB — EXPECTORATED SPUTUM ASSESSMENT W REFEX TO RESP CULTURE

## 2014-10-02 MED ORDER — IPRATROPIUM-ALBUTEROL 0.5-2.5 (3) MG/3ML IN SOLN: 3.0000 mL | Freq: Four times a day (QID) | RESPIRATORY_TRACT | Status: DC

## 2014-10-02 MED ORDER — ENSURE ENLIVE PO LIQD: 237.0000 mL | Freq: Two times a day (BID) | ORAL | Status: DC

## 2014-10-02 MED ORDER — TIOTROPIUM BROMIDE MONOHYDRATE 18 MCG IN CAPS: 18.0000 ug | ORAL_CAPSULE | Freq: Every day | RESPIRATORY_TRACT | Status: DC

## 2014-10-02 MED ORDER — METHYLPREDNISOLONE 4 MG PO TBPK: ORAL_TABLET | ORAL | Status: DC

## 2014-10-02 MED ORDER — SODIUM CHLORIDE 0.9 % IJ SOLN
3.0000 mL | Freq: Two times a day (BID) | INTRAMUSCULAR | Status: DC
  Administered 2014-10-02: 3 mL via INTRAVENOUS

## 2014-10-02 NOTE — Plan of Care (Signed)
Problem: Discharge Progression Outcomes Goal: Pneumonia vaccine received if indicated Outcome: Adequate for Discharge Pt wants to check with MD. Goal: Other Discharge Outcomes/Goals Outcome: Completed/Met Date Met:  10/02/14 Up in room; independent in self care. Significant other with pt. VSS and labs stable. No issues. Oral and written discharge instructions given with stated understanding with home meds done.

## 2014-10-02 NOTE — Plan of Care (Signed)
Problem: Discharge Progression Outcomes Goal: Discharge plan in place and appropriate Outcome: Completed/Met Date Met:  10/02/14 Completed.

## 2014-10-02 NOTE — Plan of Care (Signed)
Problem: Discharge Progression Outcomes Goal: Pneumonia vaccine received if indicated Outcome: Adequate for Discharge Pt declines wants to talk with MD first.

## 2014-10-02 NOTE — Care Management Note (Signed)
Case Management Note  Patient Details  Name: Whitney [name] MRN: [id number] Date of Birth: 09-30-1970  Subjective/Objective:       Whitney [name] was discharged home today with no home health orders and no oxygen. Discussed MD order for a home nebulizer machine with Whitney [name]'s [facility] nurse Erskine Squibb. Erskine Squibb, RN, reported that the physician, Dr Amado Coe , had discontinued the home nebulizer order prior to patient discharge today.            Action/Plan:   Expected Discharge Date:                  Expected Discharge Plan:     In-House Referral:     Discharge planning Services     Post Acute Care Choice:    Choice offered to:     DME Arranged:    DME Agency:     HH Arranged:    HH Agency:     Status of Service:     Medicare Important Message Given:    Date Medicare IM Given:    Medicare IM give by:    Date Additional Medicare IM Given:    Additional Medicare Important Message give by:     If discussed at Long Length of Stay Meetings, dates discussed:    Additional Comments:  Richard Ritchey A, RN 10/02/2014, 12:47 PM

## 2014-10-02 NOTE — Progress Notes (Signed)
Desires and agrees ready to go home. Oral and written discharge instructions ( AVS) given with pt advised she has had all of morning meds and start with pm meds. Transported in [facility] to private vehicle when ride arrived to go home.

## 2014-10-02 NOTE — Progress Notes (Signed)
Desires and agrees with discharge home. Oral and written discharge instructions with updated med list given. Pt advised that she had taken all of her morning meds and start with pm meds and stated she understood. Transported in [facility] to ride to go home.

## 2014-10-02 NOTE — Discharge Summary (Signed)
[facility] [place] at [facility]   PATIENT NAME: Whitney [name]    MR#:  [id number]  DATE OF BIRTH:  December 16, 1970  DATE OF ADMISSION:  09/30/2014 ADMITTING PHYSICIAN: Altamese Dilling, MD  DATE OF DISCHARGE: 10/02/2014 10:21 AM  PRIMARY CARE PHYSICIAN: Jolene Provost III, MD     ADMISSION DIAGNOSIS:  Hypoxia [R09.02] COPD with exacerbation [J44.1] Acute on chronic respiratory failure with hypoxia [J96.21]  DISCHARGE DIAGNOSIS:  Principal Problem:   Acute respiratory failure with hypoxia Active Problems:   COPD exacerbation   Acute bronchitis   Malnutrition of moderate degree   Asymptomatic HIV infection   SECONDARY DIAGNOSIS:   Past Medical History  Diagnosis Date  . COPD (chronic obstructive pulmonary disease)   . HIV disease   . Anxiety   . PTSD (post-traumatic stress disorder)   . GERD (gastroesophageal reflux disease)     .pro HOSPITAL COURSE:   Whitney [name] is a 44 year old Caucasian female with history of HIV, anxiety, gastroesophageal reflux disease, history of admission for pneumonia in July 2016,  COPD, ongoing tobacco abuse who presents to the hospital with complaints of chest tightness and shortness of breath as well as cough. In emergency room, her O2 sats were noted to be 87% and as low as 84%, patient was admitted to the hospital with diagnosis of COPD exacerbation due to acute bronchitis, as her chest x-ray did not show pneumonia. She was initiated on antibiotic therapy as well as steroids,  inhalers and nebulizers. She improved clinically and was ready to be discharged home on 10/02/2014. She was weaned off oxygen and her oxygen saturations remained stable. Discussion by problem 1. Acute respiratory failure with hypoxia with oxygen saturations of 87% on room air In emergency room, weaned off oxygen therapy to room air today, follow-up on exertion and likely discharge home 2. COPD exacerbation. Continue steroid taper,  inhalation therapy, nebulizers, give prescription for nebulizing machine. Patient has improved and is ready to be discharged home 3. Acute bronchitis due to unknown etiologic agent at present. Continue therapy with levofloxacin to complete course. Unable to get sputum cultures , but clinically improved on the levofloxacin. Continue therapy to complete course 4. Tobacco abuse. Discussed this patient for approximately 4 minutes in the recent past. Nicotine replacement therapy to be continued 5. HIV. Continue outpatient medications. No changes 6. History of anxiety. Continue therapy with Xanax as needed DISCHARGE CONDITIONS:   Fair  CONSULTS OBTAINED:     DRUG ALLERGIES:   Allergies  Allergen Reactions  . Penicillins Hives  . Aspirin Rash and Other (See Comments)    Reaction:  GI upset     DISCHARGE MEDICATIONS:   Discharge Medication List as of 10/02/2014  9:01 AM    START taking these medications   Details  feeding supplement, ENSURE ENLIVE, (ENSURE ENLIVE) LIQD Take 237 mLs by mouth 2 (two) times daily between meals., Starting 10/02/2014, Until Discontinued, Normal    ipratropium-albuterol (DUONEB) 0.5-2.5 (3) MG/3ML SOLN Take 3 mLs by nebulization 4 (four) times daily., Starting 10/02/2014, Until Discontinued, Normal    methylPREDNISolone (MEDROL DOSEPAK) 4 MG TBPK tablet follow package directions, Normal    tiotropium (SPIRIVA) 18 MCG inhalation capsule Place 1 capsule (18 mcg total) into inhaler and inhale daily., Starting 10/02/2014, Until Discontinued, Normal      CONTINUE these medications which have NOT CHANGED   Details  Abacavir-Dolutegravir-Lamivud (TRIUMEQ) 600-50-300 MG TABS Take 1 tablet by mouth daily., Until Discontinued, Historical Med    albuterol (  PROVENTIL HFA;VENTOLIN HFA) 108 (90 BASE) MCG/ACT inhaler Inhale 1-2 puffs into the lungs every 6 (six) hours as needed for wheezing or shortness of breath. , Until Discontinued, Historical Med    diphenhydrAMINE  (BENADRYL) 25 MG tablet Take 25 mg by mouth every 6 (six) hours as needed for allergies., Until Discontinued, Historical Med    DULoxetine (CYMBALTA) 30 MG capsule Take 60 mg by mouth daily., Until Discontinued, Historical Med    Fluticasone-Salmeterol (ADVAIR) 250-50 MCG/DOSE AEPB Inhale 1 puff into the lungs 2 (two) times daily., Until Discontinued, Historical Med    omeprazole (PRILOSEC) 40 MG capsule Take 40 mg by mouth 2 (two) times daily., Until Discontinued, Historical Med    ferrous sulfate 325 (65 FE) MG tablet Take 1 tablet (325 mg total) by mouth 2 (two) times daily with a meal., Starting 09/12/2014, Until Discontinued, Normal    levofloxacin (LEVAQUIN) 750 MG tablet Take 1 tablet (750 mg total) by mouth daily., Starting 09/12/2014, Until Discontinued, Normal      STOP taking these medications     albuterol (PROVENTIL) (2.5 MG/3ML) 0.083% nebulizer solution      predniSONE (DELTASONE) 10 MG tablet          DISCHARGE INSTRUCTIONS:    Follow-up with primary care physician, Dr. Wilson Singer in 2 or 3 days after discharge  If you experience worsening of your admission symptoms, develop shortness of breath, life threatening emergency, suicidal or homicidal thoughts you must seek medical attention immediately by calling 911 or calling your MD immediately  if symptoms less severe.  You Must read complete instructions/literature along with all the possible adverse reactions/side effects for all the Medicines you take and that have been prescribed to you. Take any new Medicines after you have completely understood and accept all the possible adverse reactions/side effects.   Please note  You were cared for by a hospitalist during your hospital stay. If you have any questions about your discharge medications or the care you received while you were in the hospital after you are discharged, you can call the unit and asked to speak with the hospitalist on call if the hospitalist that took  care of you is not available. Once you are discharged, your primary care physician will handle any further medical issues. Please note that NO REFILLS for any discharge medications will be authorized once you are discharged, as it is imperative that you return to your primary care physician (or establish a relationship with a primary care physician if you do not have one) for your aftercare needs so that they can reassess your need for medications and monitor your lab values.    Today   CHIEF COMPLAINT:   Chief Complaint  Patient presents with  . Respiratory Distress    HISTORY OF PRESENT ILLNESS:  Whitney [name]  is a 44 y.o. female with a known history of  HIV, anxiety, gastroesophageal reflux disease, history of admission for pneumonia in July 2016,  COPD, ongoing tobacco abuse who presents to the hospital with complaints of chest tightness and shortness of breath as well as cough. In emergency room, her O2 sats were noted to be 87% and as low as 84%, patient was admitted to the hospital with diagnosis of COPD exacerbation due to acute bronchitis, as her chest x-ray did not show pneumonia. She was initiated on antibiotic therapy as well as steroids,  inhalers and nebulizers. She improved clinically and was ready to be discharged home on 10/02/2014. She was weaned off oxygen  and her oxygen saturations remained stable. Discussion by problem 1. Acute respiratory failure with hypoxia with oxygen saturations of 87% on room air In emergency room, weaned off oxygen therapy to room air today, follow-up on exertion and likely discharge home 2. COPD exacerbation. Continue steroid taper, inhalation therapy, nebulizers, give prescription for nebulizing machine. Patient has improved and is ready to be discharged home 3. Acute bronchitis due to unknown etiologic agent at present. Continue therapy with levofloxacin to complete course. Unable to get sputum cultures , but clinically improved on the  levofloxacin. Continue therapy to complete course 4. Tobacco abuse. Discussed this patient for approximately 4 minutes in the recent past. Nicotine replacement therapy to be continued 5. HIV. Continue outpatient medications. No changes 6. History of anxiety. Continue therapy with Xanax as needed    VITAL SIGNS:  Blood pressure 163/108, pulse 90, temperature 98.3 F (36.8 C), temperature source Oral, resp. rate 18, height 5\' 4"  (1.626 m), weight 46.267 kg (102 lb), last menstrual period 08/29/2014, SpO2 100 %.  I/O:   Intake/Output Summary (Last 24 hours) at 10/02/14 1346 Last data filed at 10/02/14 0900  Gross per 24 hour  Intake    480 ml  Output      0 ml  Net    480 ml    PHYSICAL EXAMINATION:  GENERAL:  44 y.o.-year-old patient lying in the bed with no acute distress.  EYES: Pupils equal, round, reactive to light and accommodation. No scleral icterus. Extraocular muscles intact.  HEENT: Head atraumatic, normocephalic. Oropharynx and nasopharynx clear.  NECK:  Supple, no jugular venous distention. No thyroid enlargement, no tenderness.  LUNGS: Diminished breath sounds bilaterally, no wheezing, fewrhonchi were heard but no crepitation. No use of accessory muscles of respiration.  CARDIOVASCULAR: S1, S2 normal. No murmurs, rubs, or gallops.  ABDOMEN: Soft, non-tender, non-distended. Bowel sounds present. No organomegaly or mass.  EXTREMITIES: No pedal edema, cyanosis, or clubbing.  NEUROLOGIC: Cranial nerves II through XII are intact. Muscle strength 5/5 in all extremities. Sensation intact. Gait not checked.  PSYCHIATRIC: The patient is alert and oriented x 3.  SKIN: No obvious rash, lesion, or ulcer.   DATA REVIEW:   CBC  Recent Labs Lab 10/01/14 0504  WBC 7.2  HGB 8.0*  HCT 27.6*  PLT 224    Chemistries   Recent Labs Lab 09/29/14 0824  10/01/14 0504  NA 142  < > 140  K 2.9*  < > 4.6  CL 105  < > 105  CO2 27  < > 30  GLUCOSE 134*  < > 134*  BUN 11  < > 10   CREATININE 0.85  < > 0.60  CALCIUM 8.6*  < > 9.3  AST 186*  --   --   ALT 86*  --   --   ALKPHOS 72  --   --   BILITOT 0.3  --   --   < > = values in this interval not displayed.  Cardiac Enzymes  Recent Labs Lab 09/30/14 0015  TROPONINI <0.03    Microbiology Results  Results for orders placed or performed during the hospital encounter of 09/30/14  Blood culture (routine x 2)     Status: None (Preliminary result)   Collection Time: 09/30/14  2:40 PM  Result Value Ref Range Status   Specimen Description BLOOD LEFT FATTY CASTS  Final   Special Requests BOTTLES DRAWN AEROBIC AND ANAEROBIC 5 CC  Final   Culture NO GROWTH < 24 HOURS  Final   Report Status PENDING  Incomplete  Blood culture (routine x 2)     Status: None (Preliminary result)   Collection Time: 09/30/14  2:40 PM  Result Value Ref Range Status   Specimen Description BLOOD RIGHT ASSIST CONTROL  Final   Special Requests   Final    BOTTLES DRAWN AEROBIC AND ANAEROBIC  15 CC AEROBIC, 12 CC ANAEROBIC   Culture NO GROWTH < 24 HOURS  Final   Report Status PENDING  Incomplete    RADIOLOGY:  No results found.  EKG:   Orders placed or performed during the hospital encounter of 09/30/14  . ED EKG  . ED EKG  . EKG 12-Lead  . EKG 12-Lead      Management plans discussed with the patient, family and they are in agreement.  CODE STATUS:   TOTAL TIME TAKING CARE OF THIS PATIENT: 40 minutes.    [name] M.D on 10/02/2014 at 1:46 PM  Between 7am to 6pm - Pager - [phone]  After 6pm go to [email or web address] - password [email or web address] [facility]  [facility]  Office  [phone]  CC: Primary care physician; Shaune Leeks, MD

## 2014-10-02 NOTE — Plan of Care (Signed)
Problem: Discharge Progression Outcomes Goal: Dyspnea controlled Individualization: From home lives with adult son, smoker 10cigs/day, not currently on home o2, moderate fall risk, history of HTN, anxiety controlled by home meds, history of COPD  Outcome: Completed/Met Date Met:  10/02/14 Room air, respiratory status stable. Sputum sent to lab. Levaquin given po Goal: Home O2 if indicated Outcome: Not Applicable Date Met:  78/41/28 Not needed.

## 2014-10-02 NOTE — Discharge Instructions (Signed)
Acute Respiratory Failure °Respiratory failure is when your lungs are not working well and your breathing (respiratory) system fails. When respiratory failure occurs, it is difficult for your lungs to get enough oxygen, get rid of carbon dioxide, or both. Respiratory failure can be life threatening.  °Respiratory failure can be acute or chronic. Acute respiratory failure is sudden, severe, and requires emergency medical treatment. Chronic respiratory failure is less severe, happens over time, and requires ongoing treatment.  °WHAT ARE THE CAUSES OF ACUTE RESPIRATORY FAILURE?  °Any problem affecting the heart or lungs can cause acute respiratory failure. Some of these causes include the following: °· Chronic bronchitis and emphysema (COPD).   °· Blood clot going to a lung (pulmonary embolism).   °· Having water in the lungs caused by heart failure, lung injury, or infection (pulmonary edema).   °· Collapsed lung (pneumothorax).   °· Pneumonia.   °· Pulmonary fibrosis.   °· Obesity.   °· Asthma.   °· Heart failure.   °· Any type of trauma to the chest that can make breathing difficult.   °· Nerve or muscle diseases making chest movements difficult. °WHAT SYMPTOMS SHOULD YOU WATCH FOR?  °If you have any of these signs or symptoms, you should seek immediate medical care:  °· You have shortness of breath (dyspnea) with or without activity.   °· You have rapid, fast breathing (tachypnea).   °· You are wheezing. °· You are unable to say more than a few words without having to catch your breath. °· You find it very difficult to function normally. °· You have a fast heart rate.   °· You have a bluish color to your finger or toe nail beds.   °· You have confusion or drowsiness or both.   °HOW WILL MY ACUTE RESPIRATORY FAILURE BE TREATED?  °Treatment of acute respiratory failure depends on the cause of the respiratory failure. Usually, you will stay in the intensive care unit so your breathing can be watched closely. Treatment  can include the following: °· Oxygen. Oxygen can be delivered through the following: °¨ Nasal cannula. This is small tubing that goes in your nose to give you oxygen. °¨ Face mask. A face mask covers your nose and mouth to give you oxygen. °· Medicine. Different medicines can be given to help with breathing. These can include: °¨ Nebulizers. Nebulizers deliver medicines to open the air passages (bronchodilators). These medicines help to open or relax the airways in the lungs so you can breathe better. They can also help loosen mucus from your lungs. °¨ Diuretics. Diuretic medicines can help you breathe better by getting rid of extra water in your body. °¨ Steroids. Steroid medicines can help decrease swelling (inflammation) in your lungs. °¨ Antibiotics. °· Chest tube. If you have a collapsed lung (pneumothorax), a chest tube is placed to help reinflate the lung. °· Non-invasive positive pressure ventilation (NPPV). This is a tight-fitting mask that goes over your nose and mouth. The mask has tubing that is attached to a machine. The machine blows air into the tubing, which helps to keep the tiny air sacs (alveoli) in your lungs open. This machine allows you to breathe on your own. °· Ventilator. A ventilator is a breathing machine. When on a ventilator, a breathing tube is put into the lungs. A ventilator is used when you can no longer breathe well enough on your own. You may have low oxygen levels or high carbon dioxide (CO2) levels in your blood. When you are on a ventilator, sedation and pain medicines are given to make you sleep   so your lungs can heal. °Document Released: 02/10/2013 Document Revised: 06/22/2013 Document Reviewed: 02/10/2013 °[employer]® Patient Information ©2015 [employer]. This information is not intended to replace advice given to you by your health care provider. Make sure you discuss any questions you have with your health care provider. ° °

## 2014-10-02 NOTE — Progress Notes (Signed)
        To Whom It May Concern       Mrs. Whitney [name] was hospitalized at [facility] from the 11th through 13th of August , 2016. Her daughter, Ms. Baeleigh Devincent took care of her mother while she was in the hospital. Please excuse her from work. Thank you for your understanding.      Sincerely,   [name]

## 2014-10-02 NOTE — Plan of Care (Signed)
Problem: Discharge Progression Outcomes Goal: Pneumonia vaccine received if indicated Outcome: Adequate for Discharge Refuses at this time; wants to check with pt.

## 2014-10-02 NOTE — Plan of Care (Signed)
Problem: Discharge Progression Outcomes Goal: Other Discharge Outcomes/Goals Outcome: Progressing Plan of Care Progress to Goal:   Pt has been sleeping most of the shift. Pt denies pain. Pt has walked around the nurses station once during shift. No other signs of distress noted. Will continue to monitor.

## 2014-10-05 LAB — CULTURE, BLOOD (ROUTINE X 2)
Culture: NO GROWTH
Culture: NO GROWTH

## 2014-10-05 LAB — CULTURE, RESPIRATORY W GRAM STAIN: Special Requests: NORMAL

## 2014-10-05 LAB — CULTURE, RESPIRATORY: CULTURE: NORMAL

## 2014-10-23 ENCOUNTER — Emergency Department: Payer: Medicaid Other

## 2014-10-23 ENCOUNTER — Emergency Department
Admission: EM | Admit: 2014-10-23 | Discharge: 2014-10-23 | Disposition: A | Discharge status: Home / Self Care | Payer: Medicaid Other | Attending: Emergency Medicine | Admitting: Emergency Medicine

## 2014-10-23 ENCOUNTER — Encounter: Payer: Self-pay | Admitting: Emergency Medicine

## 2014-10-23 DIAGNOSIS — Z7952 Long term (current) use of systemic steroids: Secondary | ICD-10-CM | POA: Insufficient documentation

## 2014-10-23 DIAGNOSIS — J441 Chronic obstructive pulmonary disease with (acute) exacerbation: Secondary | ICD-10-CM | POA: Insufficient documentation

## 2014-10-23 DIAGNOSIS — J449 Chronic obstructive pulmonary disease, unspecified: Secondary | ICD-10-CM

## 2014-10-23 DIAGNOSIS — F419 Anxiety disorder, unspecified: Secondary | ICD-10-CM | POA: Insufficient documentation

## 2014-10-23 DIAGNOSIS — Z79899 Other long term (current) drug therapy: Secondary | ICD-10-CM | POA: Insufficient documentation

## 2014-10-23 DIAGNOSIS — Z720 Tobacco use: Secondary | ICD-10-CM | POA: Insufficient documentation

## 2014-10-23 DIAGNOSIS — Z7951 Long term (current) use of inhaled steroids: Secondary | ICD-10-CM | POA: Insufficient documentation

## 2014-10-23 DIAGNOSIS — Z880 Allergy status to penicillin: Secondary | ICD-10-CM | POA: Insufficient documentation

## 2014-10-23 LAB — CBC WITH DIFFERENTIAL/PLATELET
BASOS ABS: 0 10*3/uL (ref 0–0.1)
Basophils Relative: 0 %
Eosinophils Absolute: 0 10*3/uL (ref 0–0.7)
HEMATOCRIT: 29 % — AB (ref 35.0–47.0)
Hemoglobin: 8.4 g/dL — ABNORMAL LOW (ref 12.0–16.0)
Lymphs Abs: 1.2 10*3/uL (ref 1.0–3.6)
MCH: 19.4 pg — ABNORMAL LOW (ref 26.0–34.0)
MCHC: 29 g/dL — ABNORMAL LOW (ref 32.0–36.0)
MCV: 67 fL — AB (ref 80.0–100.0)
Monocytes Absolute: 0.2 10*3/uL (ref 0.2–0.9)
Monocytes Relative: 3 %
NEUTROS ABS: 5.7 10*3/uL (ref 1.4–6.5)
PLATELETS: 279 10*3/uL (ref 150–440)
RBC: 4.33 MIL/uL (ref 3.80–5.20)
RDW: 17.4 % — ABNORMAL HIGH (ref 11.5–14.5)
WBC: 7.2 10*3/uL (ref 3.6–11.0)

## 2014-10-23 LAB — BASIC METABOLIC PANEL
ANION GAP: 6 (ref 5–15)
BUN: 11 mg/dL (ref 6–20)
CO2: 32 mmol/L (ref 22–32)
Calcium: 8.6 mg/dL — ABNORMAL LOW (ref 8.9–10.3)
Chloride: 99 mmol/L — ABNORMAL LOW (ref 101–111)
Creatinine, Ser: 0.6 mg/dL (ref 0.44–1.00)
Glucose, Bld: 123 mg/dL — ABNORMAL HIGH (ref 65–99)
POTASSIUM: 3 mmol/L — AB (ref 3.5–5.1)
SODIUM: 137 mmol/L (ref 135–145)

## 2014-10-23 LAB — FIBRIN DERIVATIVES D-DIMER (ARMC ONLY): Fibrin derivatives D-dimer (ARMC): 231.91 (ref 0–499)

## 2014-10-23 LAB — TROPONIN I: Troponin I: <0.03 ng/mL (ref <0.031)

## 2014-10-23 LAB — MAGNESIUM: MAGNESIUM: 1.9 mg/dL (ref 1.7–2.4)

## 2014-10-23 MED ORDER — IPRATROPIUM-ALBUTEROL 0.5-2.5 (3) MG/3ML IN SOLN
3.0000 mL | Freq: Once | RESPIRATORY_TRACT | Status: AC
  Administered 2014-10-23: 3 mL via RESPIRATORY_TRACT

## 2014-10-23 MED ORDER — POTASSIUM CHLORIDE CRYS ER 20 MEQ PO TBCR
20.0000 meq | EXTENDED_RELEASE_TABLET | Freq: Once | ORAL | Status: AC
  Administered 2014-10-23: 20 meq via ORAL
  Filled 2014-10-23: qty 1

## 2014-10-23 MED ORDER — PREDNISONE 20 MG PO TABS: 20.0000 mg | ORAL_TABLET | Freq: Two times a day (BID) | ORAL | Status: AC

## 2014-10-23 MED ORDER — LORAZEPAM 1 MG PO TABS
1.0000 mg | ORAL_TABLET | Freq: Two times a day (BID) | ORAL | Status: AC | PRN
Start: 2014-10-23 — End: 2014-10-30

## 2014-10-23 MED ORDER — METHYLPREDNISOLONE SODIUM SUCC 125 MG IJ SOLR
125.0000 mg | Freq: Once | INTRAMUSCULAR | Status: AC
  Administered 2014-10-23: 125 mg via INTRAVENOUS
  Filled 2014-10-23: qty 2

## 2014-10-23 MED ORDER — LORAZEPAM 2 MG/ML IJ SOLN
1.0000 mg | Freq: Once | INTRAMUSCULAR | Status: AC
  Administered 2014-10-23: 1 mg via INTRAVENOUS
  Filled 2014-10-23: qty 1

## 2014-10-23 MED ORDER — IPRATROPIUM-ALBUTEROL 0.5-2.5 (3) MG/3ML IN SOLN
RESPIRATORY_TRACT | Status: AC
  Administered 2014-10-23: 3 mL via RESPIRATORY_TRACT
  Filled 2014-10-23: qty 3

## 2014-10-23 MED ORDER — LORAZEPAM 1 MG PO TABS
1.0000 mg | ORAL_TABLET | Freq: Once | ORAL | Status: AC
  Administered 2014-10-23: 1 mg via ORAL
  Filled 2014-10-23: qty 1

## 2014-10-23 NOTE — ED Notes (Signed)
Discharge instructions, follow-up care, and prescriptions reviewed with patient and family member (sister). Pt left via wheelchair to car, able to transfer herself into car without assistance. Left with sister.

## 2014-10-23 NOTE — ED Notes (Signed)
Pt noted to be sleeping bent over forward in bed, able to be awakened. Pulled pt into upright position, noted pt not wearing nasal cannula. O2 sat 94% on room air.

## 2014-10-23 NOTE — ED Provider Notes (Signed)
Time Seen: Approximately 11 AM   I have reviewed the triage notes  Chief Complaint: Shortness of Breath   History of Present Illness: Whitney [name] is a 44 y.o. female who presents with symptoms of respiratory distress. Patient has a long history of COPD, HIV (with low viral count) and continued tobacco abuse. Patient was transported here by EMS with respiratory distress symptoms. She denies any fever, productive cough, chest pain or any other new concerns. She denies any new peripheral edema, calf tenderness or swelling. She denies any back or flank pain. No arm or jaw discomfort.   Past Medical History  Diagnosis Date  . COPD (chronic obstructive pulmonary disease)   . HIV disease   . Anxiety   . PTSD (post-traumatic stress disorder)   . GERD (gastroesophageal reflux disease)     Patient Active Problem List   Diagnosis Date Noted  . Acute respiratory failure with hypoxia 10/02/2014  . Acute bronchitis 10/02/2014  . Asymptomatic HIV infection 10/02/2014  . Malnutrition of moderate degree 10/01/2014  . COPD exacerbation 09/30/2014  . PNA (pneumonia) 09/11/2014    Past Surgical History  Procedure Laterality Date  . Cesarean section    . Tubal ligation      Past Surgical History  Procedure Laterality Date  . Cesarean section    . Tubal ligation      Current Outpatient Rx  Name  Route  Sig  Dispense  Refill  . Abacavir-Dolutegravir-Lamivud (TRIUMEQ) 600-50-300 MG TABS   Oral   Take 1 tablet by mouth daily.         Marland Kitchen albuterol (PROVENTIL HFA;VENTOLIN HFA) 108 (90 BASE) MCG/ACT inhaler   Inhalation   Inhale 1-2 puffs into the lungs every 6 (six) hours as needed for wheezing or shortness of breath.          . diphenhydrAMINE (BENADRYL) 25 MG tablet   Oral   Take 25 mg by mouth every 6 (six) hours as needed for allergies.         . DULoxetine (CYMBALTA) 30 MG capsule   Oral   Take 60 mg by mouth daily.         . feeding supplement, ENSURE ENLIVE,  (ENSURE ENLIVE) LIQD   Oral   Take 237 mLs by mouth 2 (two) times daily between meals.   237 mL   12   . ferrous sulfate 325 (65 FE) MG tablet   Oral   Take 1 tablet (325 mg total) by mouth 2 (two) times daily with a meal. Patient not taking: Reported on 09/30/2014   60 tablet   0   . Fluticasone-Salmeterol (ADVAIR) 250-50 MCG/DOSE AEPB   Inhalation   Inhale 1 puff into the lungs 2 (two) times daily.         Marland Kitchen ipratropium-albuterol (DUONEB) 0.5-2.5 (3) MG/3ML SOLN   Nebulization   Take 3 mLs by nebulization 4 (four) times daily.   360 mL   3   . levofloxacin (LEVAQUIN) 750 MG tablet   Oral   Take 1 tablet (750 mg total) by mouth daily. Patient not taking: Reported on 09/29/2014   6 tablet   0   . LORazepam (ATIVAN) 1 MG tablet   Oral   Take 1 tablet (1 mg total) by mouth 2 (two) times daily as needed for anxiety.   14 tablet   0   . methylPREDNISolone (MEDROL DOSEPAK) 4 MG TBPK tablet      follow package directions   21  tablet   0   . omeprazole (PRILOSEC) 40 MG capsule   Oral   Take 40 mg by mouth 2 (two) times daily.         . predniSONE (DELTASONE) 20 MG tablet   Oral   Take 1 tablet (20 mg total) by mouth 2 (two) times daily with a meal.   10 tablet   0   . tiotropium (SPIRIVA) 18 MCG inhalation capsule   Inhalation   Place 1 capsule (18 mcg total) into inhaler and inhale daily.   30 capsule   12     Allergies:  Penicillins and Aspirin  Family History: Family History  Problem Relation Age of Onset  . Parkinson's disease Father   . CAD Father   . Hypertension Mother     Social History: Social History  Substance Use Topics  . Smoking status: Current Every Day Smoker -- 0.50 packs/day    Types: Cigarettes  . Smokeless tobacco: None  . Alcohol Use: No     Review of Systems:   10 point review of systems was performed and was otherwise negative:  Constitutional: No fever Eyes: No visual disturbances ENT: No sore throat, ear  pain Cardiac: No chest pain Respiratory: No shortness of breath, wheezing, or stridor Abdomen: No abdominal pain, no vomiting, No diarrhea Endocrine: No weight loss, No night sweats Extremities: No peripheral edema, cyanosis Skin: No rashes, easy bruising Neurologic: No focal weakness, trouble with speech or swollowing Urologic: No dysuria, Hematuria, or urinary frequency   Physical Exam:  ED Triage Vitals  Enc Vitals Group     BP 10/23/14 0935 135/85 mmHg     Pulse Rate 10/23/14 0935 96     Resp 10/23/14 0935 22     Temp 10/23/14 1542 97.7 F (36.5 C)     Temp Source 10/23/14 1542 Oral     SpO2 10/23/14 0935 97 %     Weight 10/23/14 0935 94 lb (42.638 kg)     Height 10/23/14 0935 5\' 4"  (1.626 m)     Head Cir --      Peak Flow --      Pain Score 10/23/14 0936 0     Pain Loc --      Pain Edu? --      Excl. in GC? --     General: Awake , Alert , and Oriented times 3; GCS 15 patient's very anxious. Somewhat cachectic in appearance. Head: Normal cephalic , atraumatic Eyes: Pupils equal , round, reactive to light Nose/Throat: No nasal drainage, patent upper airway without erythema or exudate.  Neck: Supple, Full range of motion, No anterior adenopathy or palpable thyroid masses Lungs: Limited air movement but no obvious rales, rhonchi, or wheezing. Heart: Regular rate, regular rhythm without murmurs , gallops , or rubs Abdomen: Soft, non tender without rebound, guarding , or rigidity; bowel sounds positive and symmetric in all 4 quadrants. No organomegaly .        Extremities: 2 plus symmetric pulses. No edema, clubbing or cyanosis Neurologic: normal ambulation, Motor symmetric without deficits, sensory intact Skin: warm, dry, no rashes   Labs:   All laboratory work was reviewed including any pertinent negatives or positives listed below:  Labs Reviewed  BASIC METABOLIC PANEL - Abnormal; Notable for the following:    Potassium 3.0 (*)    Chloride 99 (*)    Glucose, Bld  123 (*)    Calcium 8.6 (*)    All other components within normal limits  CBC WITH DIFFERENTIAL/PLATELET - Abnormal; Notable for the following:    Hemoglobin 8.4 (*)    HCT 29.0 (*)    MCV 67.0 (*)    MCH 19.4 (*)    MCHC 29.0 (*)    RDW 17.4 (*)    All other components within normal limits  TROPONIN I  FIBRIN DERIVATIVES D-DIMER ([facility] ONLY)  MAGNESIUM   patient's hemoglobin is 8.4 though this seems to be stable in comparison to previous lab testing. D-dimer test was negative.  EKG:  ED ECG REPORT I, Jennye Moccasin, the attending physician, personally viewed and interpreted this ECG.  Date: 10/23/2014 EKG Time: 10 00 Rate: 98 Rhythm: normal sinus rhythm QRS Axis: normal Intervals: Slightly prolonged QT interval ST/T Wave abnormalities: normal Conduction Disutrbances: none Narrative Interpretation: unremarkable No acute ischemic changes  Radiology:     shortness of breath over the last 24 hours  EXAM: PORTABLE CHEST - 1 VIEW  COMPARISON: Prior chest x-ray 09/30/2014  FINDINGS: Stable cardiac and mediastinal contours. The heart is within normal limits for size. Mild pulmonary hyperinflation, central bronchitic change and interstitial prominence are stable to slightly improved compared to prior. No evidence of focal airspace consolidation, edema, pleural effusion or pneumothorax. Prominent nipple shadows. No acute osseous abnormality.  IMPRESSION: 1. No evidence of acute cardiopulmonary process. 2. Mild hyperinflation, bronchitic change and interstitial prominence are similar to slightly improved compared to prior imaging.       I personally reviewed the radiologic studies   Repeat exam shows no wheezing, rhonchi or rales.   ED Course: Patient when lying comfortably and after Ativan IV head steady breathing and stabilize pulse oximetry. Patient got up to as much is 100% on just 2 L nasal cannula. When she was awake she becomes very agitated and breathes  really fast and generates inspiratory respiratory distress without any associated wheezing. Her pulse ox will lower her but when you console the patient told her take deep slow breaths and pulse oximetry stabilizes. Without supplemental oxygen after just 1 DuoNeb along with IV steroids patient remains at 94% on room air. Patient seems to be anxious and this is playing into her physical presentations. She is afebrile with a stable chest x-ray and I felt she did not require any inpatient management at this time. I did increase her steroids and advised to continue with her breathing treatments at home and at the patient's family is in the room with her and appears to be of understanding.    Assessment:  Acute exacerbation of chronic obstructive pulmonary disease  Final Clinical Impression:  Final diagnoses:  Chronic obstructive pulmonary disease, unspecified COPD, unspecified chronic bronchitis type  Anxiety     Plan:Patient was advised to return immediately if condition worsens. Patient was advised to follow up with her primary care physician or other specialized physicians involved and in their current assessment. Patient was discharged with a bolus of steroids along with Ativan for anxiety.             Jennye Moccasin, MD 10/23/14 [phone]

## 2014-10-23 NOTE — ED Notes (Signed)
Pt in with family reporting increased shortness of breath over last 2 days and wheezing. Pt with labored breathing, restless, difficulty focusing. Given duoneb treatment as ordered, pt repeatedly nodding off to sleep with nebulizer in hand and startling awake. Pt's family state pt has not slept in 4-5 days.

## 2014-10-23 NOTE — ED Notes (Signed)
Reports sob worse x 2 days.  Recently dx with pneumonia and copd.  Skin w/d, wheezing noted.

## 2014-10-23 NOTE — Discharge Instructions (Signed)
Chronic Obstructive Pulmonary Disease  Chronic obstructive pulmonary disease (COPD) is a common lung condition in which airflow from the lungs is limited. COPD is a general term that can be used to describe many different lung problems that limit airflow, including both chronic bronchitis and emphysema. If you have COPD, your lung function will probably never return to normal, but there are measures you can take to improve lung function and make yourself feel better.   CAUSES    Smoking (common).    Exposure to secondhand smoke.    Genetic problems.   Chronic inflammatory lung diseases or recurrent infections.  SYMPTOMS    Shortness of breath, especially with physical activity.    Deep, persistent (chronic) cough with a large amount of thick mucus.    Wheezing.    Rapid breaths (tachypnea).    Gray or bluish discoloration (cyanosis) of the skin, especially in fingers, toes, or lips.    Fatigue.    Weight loss.    Frequent infections or episodes when breathing symptoms become much worse (exacerbations).    Chest tightness.  DIAGNOSIS   Your health care provider will take a medical history and perform a physical examination to make the initial diagnosis. Additional tests for COPD may include:    Lung (pulmonary) function tests.   Chest X-ray.   CT scan.   Blood tests.  TREATMENT   Treatment available to help you feel better when you have COPD includes:    Inhaler and nebulizer medicines. These help manage the symptoms of COPD and make your breathing more comfortable.   Supplemental oxygen. Supplemental oxygen is only helpful if you have a low oxygen level in your blood.    Exercise and physical activity. These are beneficial for nearly all people with COPD. Some people may also benefit from a pulmonary rehabilitation program.  HOME CARE INSTRUCTIONS    Take all medicines (inhaled or pills) as directed by your health care provider.   Avoid over-the-counter medicines or cough syrups  that dry up your airway (such as antihistamines) and slow down the elimination of secretions unless instructed otherwise by your health care provider.    If you are a smoker, the most important thing that you can do is stop smoking. Continuing to smoke will cause further lung damage and breathing trouble. Ask your health care provider for help with quitting smoking. He or she can direct you to community resources or hospitals that provide support.   Avoid exposure to irritants such as smoke, chemicals, and fumes that aggravate your breathing.   Use oxygen therapy and pulmonary rehabilitation if directed by your health care provider. If you require home oxygen therapy, ask your health care provider whether you should purchase a pulse oximeter to measure your oxygen level at home.    Avoid contact with individuals who have a contagious illness.   Avoid extreme temperature and humidity changes.   Eat healthy foods. Eating smaller, more frequent meals and resting before meals may help you maintain your strength.   Stay active, but balance activity with periods of rest. Exercise and physical activity will help you maintain your ability to do things you want to do.   Preventing infection and hospitalization is very important when you have COPD. Make sure to receive all the vaccines your health care provider recommends, especially the pneumococcal and influenza vaccines. Ask your health care provider whether you need a pneumonia vaccine.   Learn and use relaxation techniques to manage stress.   Learn   going to whistle and breathe out (exhale) through the pursed lips for 2 seconds.   Diaphragmatic breathing. Start by putting one hand on your abdomen just above  your waist. Inhale slowly through your nose. The hand on your abdomen should move out. Then purse your lips and exhale slowly. You should be able to feel the hand on your abdomen moving in as you exhale.   Learn and use controlled coughing to clear mucus from your lungs. Controlled coughing is a series of short, progressive coughs. The steps of controlled coughing are:  1. Lean your head slightly forward.  2. Breathe in deeply using diaphragmatic breathing.  3. Try to hold your breath for 3 seconds.  4. Keep your mouth slightly open while coughing twice.  5. Spit any mucus out into a tissue.  6. Rest and repeat the steps once or twice as needed. SEEK MEDICAL CARE IF:   You are coughing up more mucus than usual.   There is a change in the color or thickness of your mucus.   Your breathing is more labored than usual.   Your breathing is faster than usual.  SEEK IMMEDIATE MEDICAL CARE IF:   You have shortness of breath while you are resting.   You have shortness of breath that prevents you from:  Being able to talk.   Performing your usual physical activities.   You have chest pain lasting longer than 5 minutes.   Your skin color is more cyanotic than usual.  You measure low oxygen saturations for longer than 5 minutes with a pulse oximeter. MAKE SURE YOU:   Understand these instructions.  Will watch your condition.  Will get help right away if you are not doing well or get worse. Document Released: 11/15/2004 Document Revised: 06/22/2013 Document Reviewed: 10/02/2012 [facility] Patient Information 2015 [place], [place]. This information is not intended to replace advice given to you by your health care provider. Make sure you discuss any questions you have with your health care provider.  Please return immediately if condition worsens. Please contact her primary physician or the physician you were given for referral. If you have any specialist physicians involved  in her treatment and plan please also contact them. Thank you for using [facility].

## 2014-12-19 ENCOUNTER — Encounter: Payer: Self-pay | Admitting: Emergency Medicine

## 2014-12-19 ENCOUNTER — Emergency Department: Payer: Self-pay

## 2014-12-19 ENCOUNTER — Emergency Department
Admission: EM | Admit: 2014-12-19 | Discharge: 2014-12-19 | Disposition: A | Discharge status: Home / Self Care | Payer: Self-pay | Attending: Emergency Medicine | Admitting: Emergency Medicine

## 2014-12-19 DIAGNOSIS — Z792 Long term (current) use of antibiotics: Secondary | ICD-10-CM | POA: Insufficient documentation

## 2014-12-19 DIAGNOSIS — Z720 Tobacco use: Secondary | ICD-10-CM | POA: Insufficient documentation

## 2014-12-19 DIAGNOSIS — Z880 Allergy status to penicillin: Secondary | ICD-10-CM | POA: Insufficient documentation

## 2014-12-19 DIAGNOSIS — J441 Chronic obstructive pulmonary disease with (acute) exacerbation: Secondary | ICD-10-CM | POA: Insufficient documentation

## 2014-12-19 DIAGNOSIS — Z7951 Long term (current) use of inhaled steroids: Secondary | ICD-10-CM | POA: Insufficient documentation

## 2014-12-19 DIAGNOSIS — Z79899 Other long term (current) drug therapy: Secondary | ICD-10-CM | POA: Insufficient documentation

## 2014-12-19 LAB — BASIC METABOLIC PANEL
ANION GAP: 6 (ref 5–15)
BUN: 9 mg/dL (ref 6–20)
CALCIUM: 8.7 mg/dL — AB (ref 8.9–10.3)
CHLORIDE: 101 mmol/L (ref 101–111)
CO2: 31 mmol/L (ref 22–32)
Creatinine, Ser: 0.66 mg/dL (ref 0.44–1.00)
GFR calc Af Amer: >60 mL/min (ref >60)
GFR calc non Af Amer: >60 mL/min (ref >60)
GLUCOSE: 120 mg/dL — AB (ref 65–99)
Potassium: 3.7 mmol/L (ref 3.5–5.1)
Sodium: 138 mmol/L (ref 135–145)

## 2014-12-19 LAB — CBC
HEMATOCRIT: 30.1 % — AB (ref 35.0–47.0)
Hemoglobin: 8.4 g/dL — ABNORMAL LOW (ref 12.0–16.0)
MCH: 18.7 pg — ABNORMAL LOW (ref 26.0–34.0)
MCHC: 28.1 g/dL — AB (ref 32.0–36.0)
MCV: 66.7 fL — AB (ref 80.0–100.0)
PLATELETS: 200 10*3/uL (ref 150–440)
RBC: 4.51 MIL/uL (ref 3.80–5.20)
RDW: 17.1 % — AB (ref 11.5–14.5)
WBC: 7.6 10*3/uL (ref 3.6–11.0)

## 2014-12-19 LAB — TROPONIN I: Troponin I: <0.03 ng/mL (ref <0.031)

## 2014-12-19 MED ORDER — IPRATROPIUM-ALBUTEROL 0.5-2.5 (3) MG/3ML IN SOLN
3.0000 mL | Freq: Once | RESPIRATORY_TRACT | Status: AC
  Administered 2014-12-19: 3 mL via RESPIRATORY_TRACT
  Filled 2014-12-19: qty 3

## 2014-12-19 MED ORDER — PREDNISONE 20 MG PO TABS
60.0000 mg | ORAL_TABLET | Freq: Once | ORAL | Status: AC
  Administered 2014-12-19: 60 mg via ORAL
  Filled 2014-12-19: qty 3

## 2014-12-19 MED ORDER — AZITHROMYCIN 250 MG PO TABS
500.0000 mg | ORAL_TABLET | Freq: Once | ORAL | Status: AC
Start: 2014-12-19 — End: 2014-12-19
  Administered 2014-12-19: 500 mg via ORAL
  Filled 2014-12-19: qty 2

## 2014-12-19 NOTE — ED Notes (Signed)
Pt to rm 17 via EMS from.  EMS report pt SOB, in tripod upon their arrival.  PT given 1 duoneb with some relief.  Pt hx COPD, HIV.  Pt NAD upon arrival, respirations equal and unlabored, skin warm and dry.
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
## 2016-01-30 ENCOUNTER — Encounter: Payer: Self-pay | Admitting: Emergency Medicine

## 2016-01-30 ENCOUNTER — Emergency Department
Admission: EM | Admit: 2016-01-30 | Discharge: 2016-01-30 | Disposition: A | Discharge status: Home / Self Care | Payer: Self-pay | Attending: Emergency Medicine | Admitting: Emergency Medicine

## 2016-01-30 DIAGNOSIS — D509 Iron deficiency anemia, unspecified: Secondary | ICD-10-CM | POA: Insufficient documentation

## 2016-01-30 DIAGNOSIS — F17210 Nicotine dependence, cigarettes, uncomplicated: Secondary | ICD-10-CM | POA: Insufficient documentation

## 2016-01-30 DIAGNOSIS — Z21 Asymptomatic human immunodeficiency virus [HIV] infection status: Secondary | ICD-10-CM | POA: Insufficient documentation

## 2016-01-30 DIAGNOSIS — Z79899 Other long term (current) drug therapy: Secondary | ICD-10-CM | POA: Insufficient documentation

## 2016-01-30 DIAGNOSIS — J441 Chronic obstructive pulmonary disease with (acute) exacerbation: Secondary | ICD-10-CM | POA: Insufficient documentation

## 2016-01-30 LAB — FERRITIN: Ferritin: 2 ng/mL — ABNORMAL LOW (ref 11–307)

## 2016-01-30 LAB — COMPREHENSIVE METABOLIC PANEL
ALBUMIN: 4.3 g/dL (ref 3.5–5.0)
ALT: 13 U/L — ABNORMAL LOW (ref 14–54)
ANION GAP: 7 (ref 5–15)
AST: 21 U/L (ref 15–41)
Alkaline Phosphatase: 56 U/L (ref 38–126)
BILIRUBIN TOTAL: 0.4 mg/dL (ref 0.3–1.2)
BUN: 9 mg/dL (ref 6–20)
CHLORIDE: 105 mmol/L (ref 101–111)
CO2: 27 mmol/L (ref 22–32)
Calcium: 8.9 mg/dL (ref 8.9–10.3)
Creatinine, Ser: 0.73 mg/dL (ref 0.44–1.00)
GFR calc Af Amer: >60 mL/min (ref >60)
Glucose, Bld: 103 mg/dL — ABNORMAL HIGH (ref 65–99)
POTASSIUM: 3.6 mmol/L (ref 3.5–5.1)
Sodium: 139 mmol/L (ref 135–145)
Total Protein: 7.4 g/dL (ref 6.5–8.1)

## 2016-01-30 LAB — IRON AND TIBC
IRON: 6 ug/dL — AB (ref 28–170)
SATURATION RATIOS: 1 % — AB (ref 10.4–31.8)
TIBC: 483 ug/dL — AB (ref 250–450)
UIBC: 477 ug/dL

## 2016-01-30 LAB — TYPE AND SCREEN
ABO/RH(D): A POS
ANTIBODY SCREEN: NEGATIVE

## 2016-01-30 LAB — CBC
HEMATOCRIT: 24.5 % — AB (ref 35.0–47.0)
HEMOGLOBIN: 6.8 g/dL — AB (ref 12.0–16.0)
MCH: 17.8 pg — ABNORMAL LOW (ref 26.0–34.0)
MCHC: 27.8 g/dL — ABNORMAL LOW (ref 32.0–36.0)
MCV: 64.1 fL — AB (ref 80.0–100.0)
Platelets: 203 10*3/uL (ref 150–440)
RBC: 3.82 MIL/uL (ref 3.80–5.20)
RDW: 16.6 % — AB (ref 11.5–14.5)
WBC: 6.5 10*3/uL (ref 3.6–11.0)

## 2016-01-30 MED ORDER — FERROUS SULFATE 325 (65 FE) MG PO TABS: 325.0000 mg | ORAL_TABLET | Freq: Two times a day (BID) | ORAL | 0 refills | Status: DC

## 2016-01-30 NOTE — ED Triage Notes (Addendum)
Pt reports was at doctor Thursday for routine visit and called today and told her that her hgb 6.5 and to come to ED. Has had some SHOB but has COPD. Pt reports skin color is normal.  Reports feels better today than has in last couple weeks.

## 2016-01-30 NOTE — Discharge Instructions (Signed)
Take her iron supplementation twice a day as prescribed. Follow up with her primary care doctor in one or 2 days. Return to the emergency room to have shortness of breath, dizziness, chest pain, if you feel like her going to pass out, or any other symptoms concerning to you.

## 2016-01-30 NOTE — ED Notes (Signed)
Patient states, "I feel too damn good to get a blood transfusion and to stay in the hospital. Me and the doctor are going to have to work out a deal". Patient denies CP, SOB or dizziness.

## 2016-01-30 NOTE — ED Provider Notes (Signed)
[facility] Emergency Department Provider Note  ____________________________________________  Time seen: Approximately 5:29 PM  I have reviewed the triage vital signs and the nursing notes.   HISTORY  Chief Complaint Abnormal Lab   HPI Whitney [name] is a 45 y.o. female history of HIV, COPD, and iron deficiency anemia who presents for evaluation of low hemoglobin.Patient reports that she had an annual checkup with her primary care doctor last week and today received a phone call that her hemoglobin was low and she had to come to the emergency room. Patient denies any active bleeding, no hematuria, no hemoptysis, no melena, no hematochezia. She does endorse heavy menstrual periods and had her last period last week. Her periods usually happen every month and last 5 days. Patient is supposed to be on iron but hasn't taken it for many months. Patient reports that when she saw her PCP last week she was having shortness of breath however that has resolved with her inhalers and she attributes that to a mild COPD exacerbation. Patient denies dizziness, lightheadedness, headaches, chest pain, shortness of breath. Patient does not wish to receive a blood transfusion. She does not take any blood thinners.  Past Medical History:  Diagnosis Date  . Anxiety   . COPD (chronic obstructive pulmonary disease) (HCC)   . GERD (gastroesophageal reflux disease)   . HIV disease (HCC)   . PTSD (post-traumatic stress disorder)     Patient Active Problem List   Diagnosis Date Noted  . Acute respiratory failure with hypoxia (HCC) 10/02/2014  . Acute bronchitis 10/02/2014  . Asymptomatic HIV infection (HCC) 10/02/2014  . Malnutrition of moderate degree (HCC) 10/01/2014  . COPD exacerbation (HCC) 09/30/2014  . PNA (pneumonia) 09/11/2014    Past Surgical History:  Procedure Laterality Date  . CESAREAN SECTION    . TUBAL LIGATION      Prior to Admission medications     Medication Sig Start Date End Date Taking? Authorizing Provider  Abacavir-Dolutegravir-Lamivud (TRIUMEQ) 600-50-300 MG TABS Take 1 tablet by mouth daily.    Historical Provider, MD  albuterol (PROVENTIL HFA;VENTOLIN HFA) 108 (90 BASE) MCG/ACT inhaler Inhale 1-2 puffs into the lungs every 6 (six) hours as needed for wheezing or shortness of breath.     Historical Provider, MD  diphenhydrAMINE (BENADRYL) 25 MG tablet Take 25 mg by mouth every 6 (six) hours as needed for allergies.    Historical Provider, MD  DULoxetine (CYMBALTA) 30 MG capsule Take 60 mg by mouth daily.    Historical Provider, MD  feeding supplement, ENSURE ENLIVE, (ENSURE ENLIVE) LIQD Take 237 mLs by mouth 2 (two) times daily between meals. 10/02/14   Katharina Caperima Vaickute, MD  ferrous sulfate 325 (65 FE) MG tablet Take 1 tablet (325 mg total) by mouth 2 (two) times daily with a meal. 01/30/16   Nita Sicklearolina Francesa Eugenio, MD  Fluticasone-Salmeterol (ADVAIR) 250-50 MCG/DOSE AEPB Inhale 1 puff into the lungs 2 (two) times daily.    Historical Provider, MD  ipratropium-albuterol (DUONEB) 0.5-2.5 (3) MG/3ML SOLN Take 3 mLs by nebulization 4 (four) times daily. 10/02/14   Katharina Caperima Vaickute, MD  levofloxacin (LEVAQUIN) 750 MG tablet Take 1 tablet (750 mg total) by mouth daily. 09/12/14   Adrian SaranSital Mody, MD  methylPREDNISolone (MEDROL DOSEPAK) 4 MG TBPK tablet follow package directions 10/02/14   Katharina Caperima Vaickute, MD  omeprazole (PRILOSEC) 40 MG capsule Take 40 mg by mouth 2 (two) times daily.    Historical Provider, MD  tiotropium (SPIRIVA) 18 MCG inhalation capsule Place  1 capsule (18 mcg total) into inhaler and inhale daily. 10/02/14   [name], MD    Allergies Penicillins and Aspirin  Family History  Problem Relation Age of Onset  . Parkinson's disease Father   . CAD Father   . Hypertension Mother     Social History Social History  Substance Use Topics  . Smoking status: Current Every Day Smoker    Packs/day: 0.50    Types: Cigarettes  . Smokeless  tobacco: Not on file  . Alcohol use No    Review of Systems  Constitutional: Negative for fever. Eyes: Negative for visual changes. ENT: Negative for sore throat. Neck: No neck pain  Cardiovascular: Negative for chest pain. Respiratory: Negative for shortness of breath. Gastrointestinal: Negative for abdominal pain, vomiting or diarrhea. Genitourinary: Negative for dysuria. Musculoskeletal: Negative for back pain. Skin: Negative for rash. Neurological: Negative for headaches, weakness or numbness. Psych: No SI or HI  ____________________________________________   PHYSICAL EXAM:  VITAL SIGNS: ED Triage Vitals  Enc Vitals Group     BP 01/30/16 1503 (!) 134/94     Pulse Rate 01/30/16 1505 98     Resp 01/30/16 1503 16     Temp 01/30/16 1502 98.4 F (36.9 C)     Temp Source 01/30/16 1502 Oral     SpO2 01/30/16 1505 99 %     Weight 01/30/16 1504 103 lb (46.7 kg)     Height 01/30/16 1504 5\' 4"  (1.626 m)     Head Circumference --      Peak Flow --      Pain Score --      Pain Loc --      Pain Edu? --      Excl. in GC? --     Constitutional: Alert and oriented. Well appearing and in no apparent distress. HEENT:      Head: Normocephalic and atraumatic.         Eyes: Conjunctivae are normal. Sclera is non-icteric. EOMI. PERRL      Mouth/Throat: Mucous membranes are moist.       Neck: Supple with no signs of meningismus. Cardiovascular: Regular rate and rhythm. No murmurs, gallops, or rubs. 2+ symmetrical distal pulses are present in all extremities. No JVD. Respiratory: Normal respiratory effort. Lungs are clear to auscultation bilaterally. No wheezes, crackles, or rhonchi.  Gastrointestinal: Soft, non tender, and non distended with positive bowel sounds. No rebound or guarding. Genitourinary: No CVA tenderness. Rectal exam showing light brown stool Hemoccult negative Musculoskeletal: Nontender with normal range of motion in all extremities. No edema, cyanosis, or erythema  of extremities. Neurologic: Normal speech and language. Face is symmetric. Moving all extremities. No gross focal neurologic deficits are appreciated. Skin: Skin is warm, dry and intact. No rash noted. Psychiatric: Mood and affect are normal. Speech and behavior are normal.  ____________________________________________   LABS (all labs ordered are listed, but only abnormal results are displayed)  Labs Reviewed  COMPREHENSIVE METABOLIC PANEL - Abnormal; Notable for the following:       Result Value   Glucose, Bld 103 (*)    ALT 13 (*)    All other components within normal limits  CBC - Abnormal; Notable for the following:    Hemoglobin 6.8 (*)    HCT 24.5 (*)    MCV 64.1 (*)    MCH 17.8 (*)    MCHC 27.8 (*)    RDW 16.6 (*)    All other components within normal limits  FERRITIN -  Abnormal; Notable for the following:    Ferritin 2 (*)    All other components within normal limits  IRON AND TIBC - Abnormal; Notable for the following:    Iron 6 (*)    TIBC 483 (*)    Saturation Ratios 1 (*)    All other components within normal limits  URINALYSIS, COMPLETE (UACMP) WITH MICROSCOPIC  TYPE AND SCREEN  ABO/RH   ____________________________________________  EKG  Patient refused ____________________________________________  RADIOLOGY  none  ____________________________________________   PROCEDURES  Procedure(s) performed: None Procedures Critical Care performed:  None ____________________________________________   INITIAL IMPRESSION / ASSESSMENT AND PLAN / ED COURSE  45 y.o. female history of HIV, COPD, and iron deficiency anemia who presents for evaluation of low hemoglobin. Patient has not been taking her iron for many months. No active bleeding. Repeat hgb here is 6.8, normal platelets. She is hemodynamically stable. Patient is refusing a transfusion at this time and wishes to try by mouth iron and home. Rectal exam with no active bleeding or melena. We'll check  urinalysis for any evidence of bleeding. She is not on any blood thinners. I have offered admission for iron infusion however patient refused it. Iron studies have been sent and will refer patient to hematology for close f/u  Clinical Course as of Jan 29 1821  [facility]  [facility] Patient is demanding discharge at this time. Does not want to wait for urinalysis to rule out a possible source of bleeding. She is refusing transfusion, refusing admission for iron infusion. I will refer her to hematology for close follow-up. Recommend that she follows up with her primary care doctor in 1-2 days. Recommended she restart her iron at home.  [CV]    Clinical Course User Index [CV] Nita Sicklearolina Nazaria Riesen, MD    Pertinent labs & imaging results that were available during my care of the patient were reviewed by me and considered in my medical decision making (see chart for details).    ____________________________________________   FINAL CLINICAL IMPRESSION(S) / ED DIAGNOSES  Final diagnoses:  Iron deficiency anemia, unspecified iron deficiency anemia type      NEW MEDICATIONS STARTED DURING THIS VISIT:  Current Discharge Medication List       Note:  This document was prepared using Dragon voice recognition software and may include unintentional dictation errors.    Nita Sicklearolina Tobias Avitabile, MD 01/30/16 Rickey [id number]

## 2016-01-30 NOTE — ED Notes (Signed)
Patient refusing further treatment. Patient is dressed, took off monitor leads. MD notified. Patient educated on risk of leaving. Patient states, "I dont care. I can feel better at home." [facility].

## 2016-07-02 IMAGING — CR DG CHEST 2V
2 series · 2 of 2 positions shown · non-contrast
Comparison: 09/11/2014

CLINICAL DATA: Pneumonia, shortness of Breath

EXAM:
CHEST  2 VIEW

[chest pa]
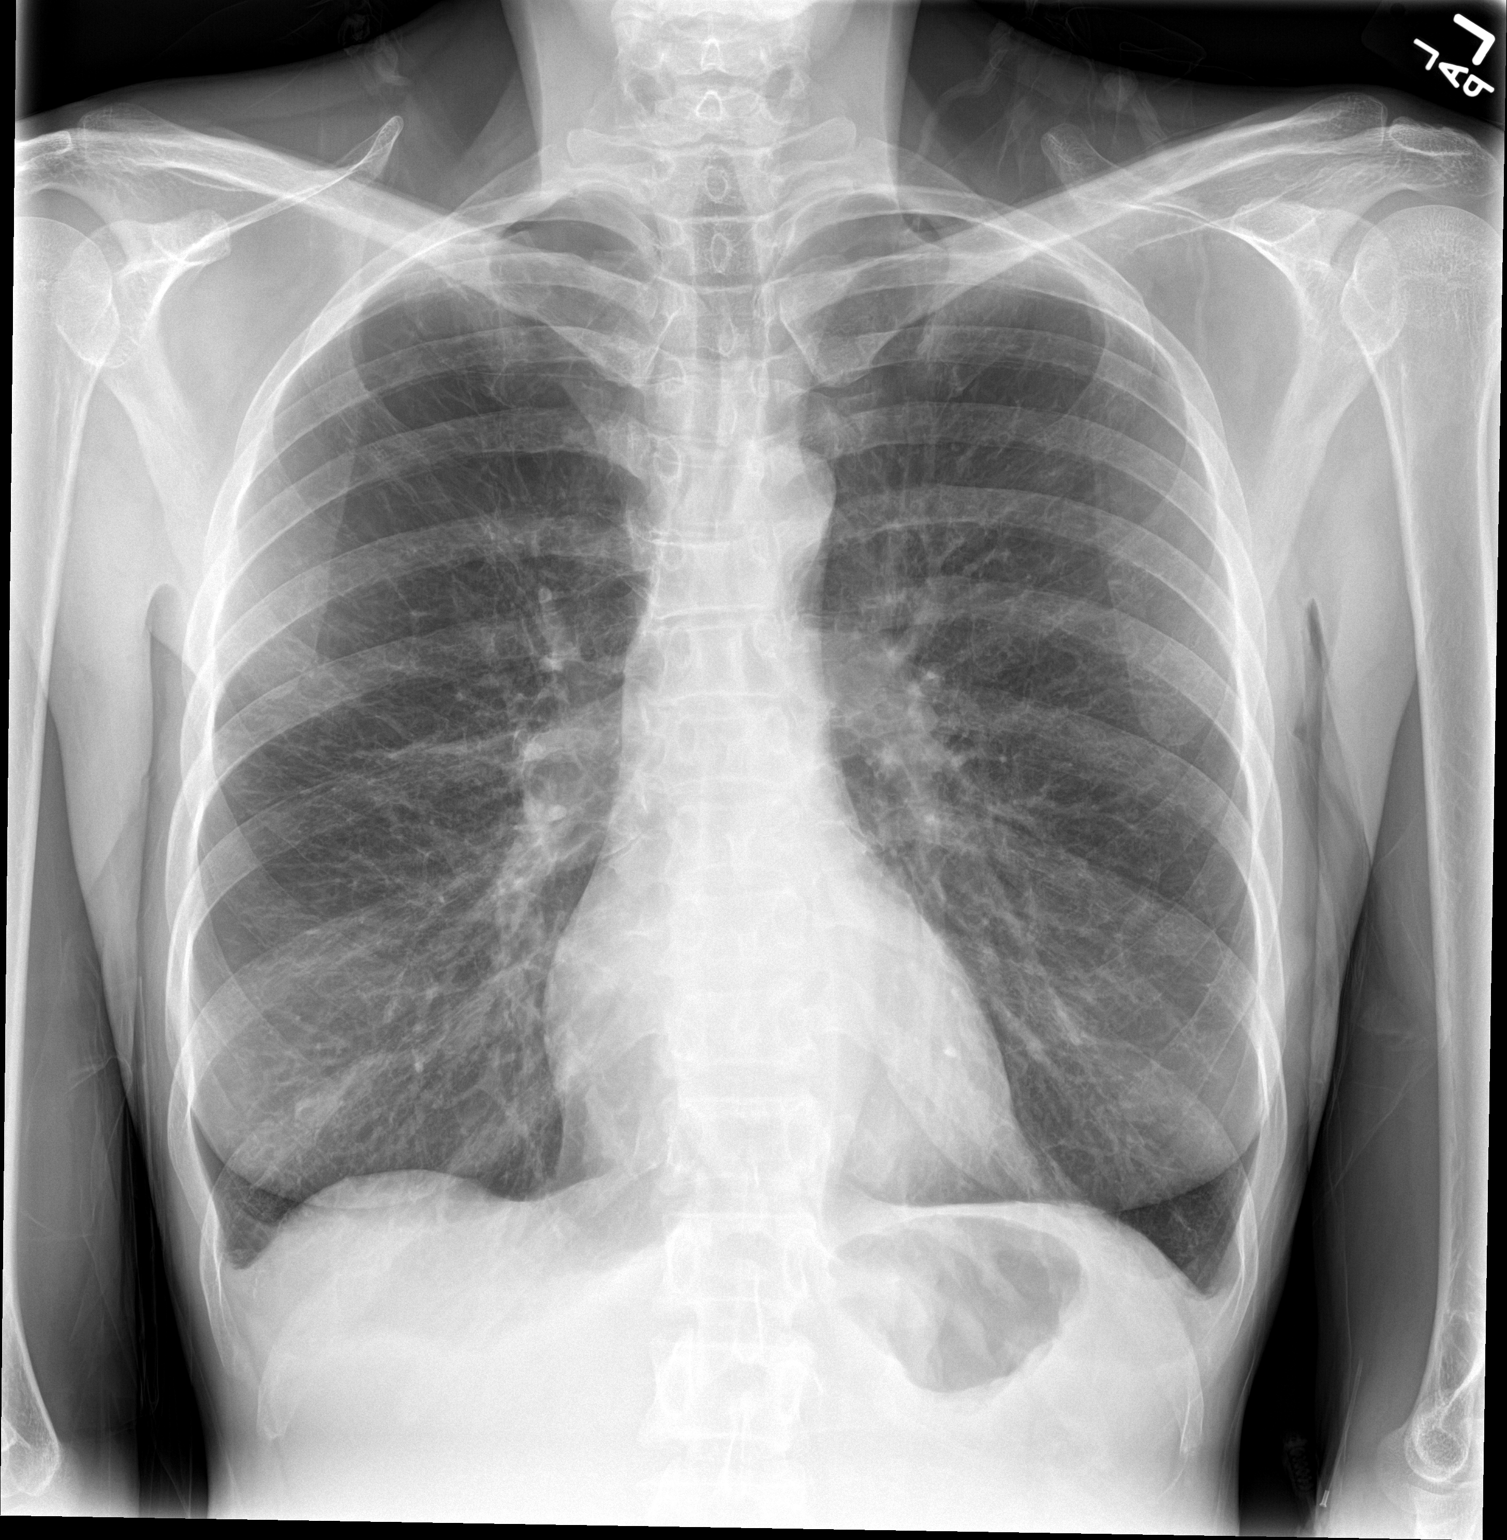

[chest lat]
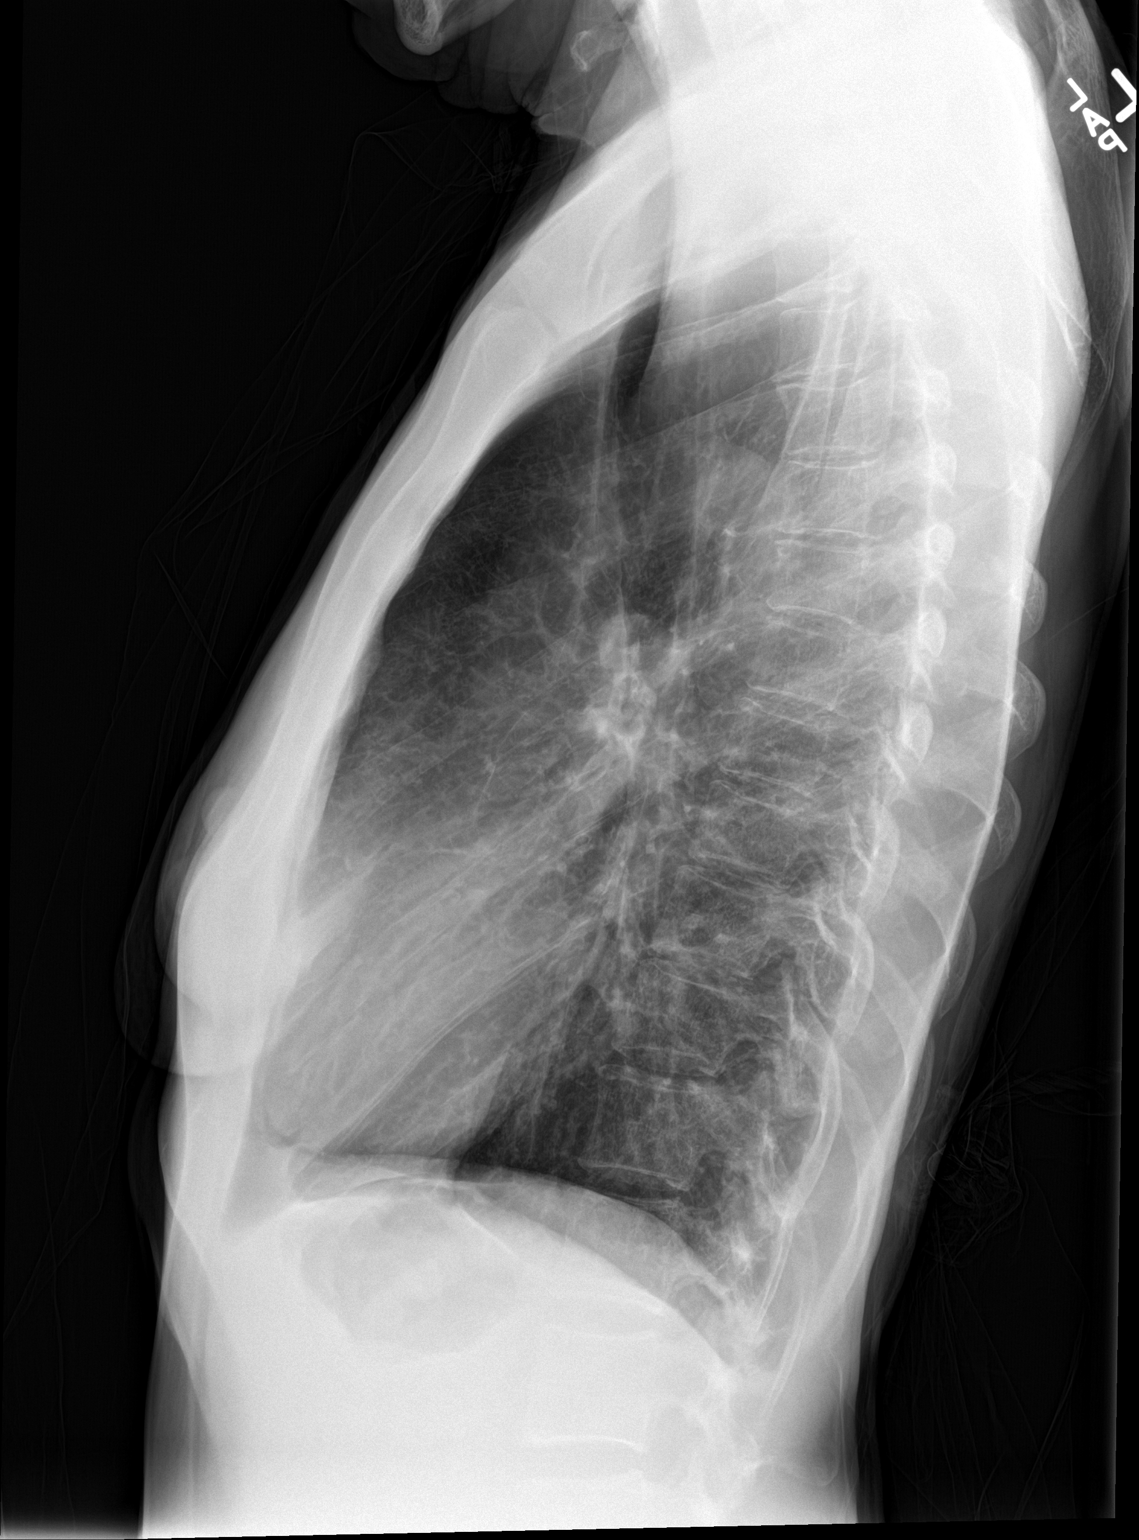

[2 of 2 positions shown; findings below may reference images not displayed]

FINDINGS: Cardiomediastinal silhouette is stable. Mild hyperinflation. Central
mild bronchitic changes. No segmental infiltrate or pulmonary edema.
IMPRESSION: Mild hyperinflation. Central mild bronchitic changes. No focal
infiltrate or pulmonary edema.

## 2016-07-03 IMAGING — CR DG CHEST 1V PORT
1 series · 1 of 1 positions shown · non-contrast
Comparison: PA and lateral chest of September 29, 2014

CLINICAL DATA: Shortness of breath, history of COPD, HIV, and
current smoker.

EXAM:
PORTABLE CHEST - 1 VIEW

[ap]
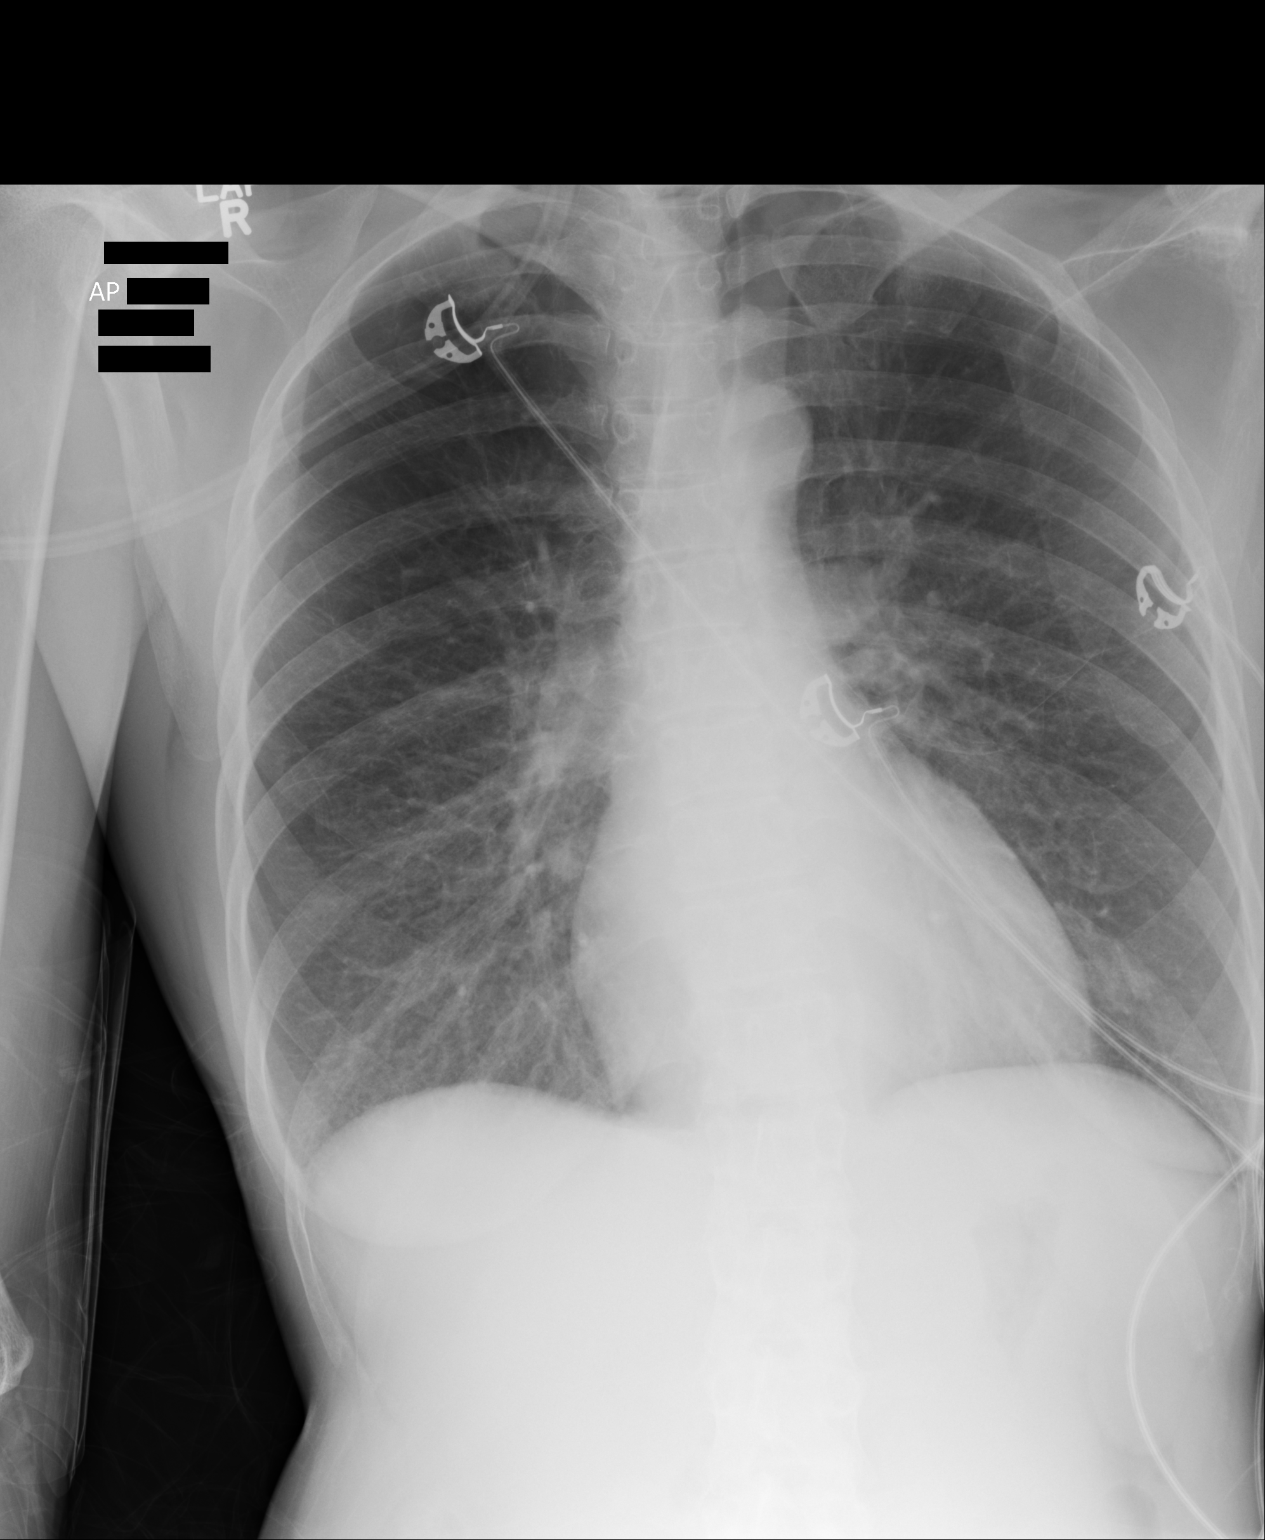

[1 of 1 positions shown; findings below may reference images not displayed]

FINDINGS: The lungs remain hyperinflated. The interstitial markings remain
mildly increased. There is no alveolar infiltrate. There is no
pleural effusion. The heart is normal in size. The pulmonary
vascularity is not engorged.
IMPRESSION: COPD with superimposed acute bronchitis. There has not been
significant change since yesterday's study. There is no pneumonia
nor CHF. If the patient's symptoms do not respond to anticipated
antibiotic therapy, chest CT scanning may be a useful next imaging
step.

## 2016-07-26 IMAGING — CR DG CHEST 1V PORT
1 series · 1 of 1 positions shown · non-contrast
Comparison: Prior chest x-ray 09/30/2014

CLINICAL DATA: 44-year-old female with COPD and progressive
shortness of breath over the last 24 hours

EXAM:
PORTABLE CHEST - 1 VIEW

[ap]
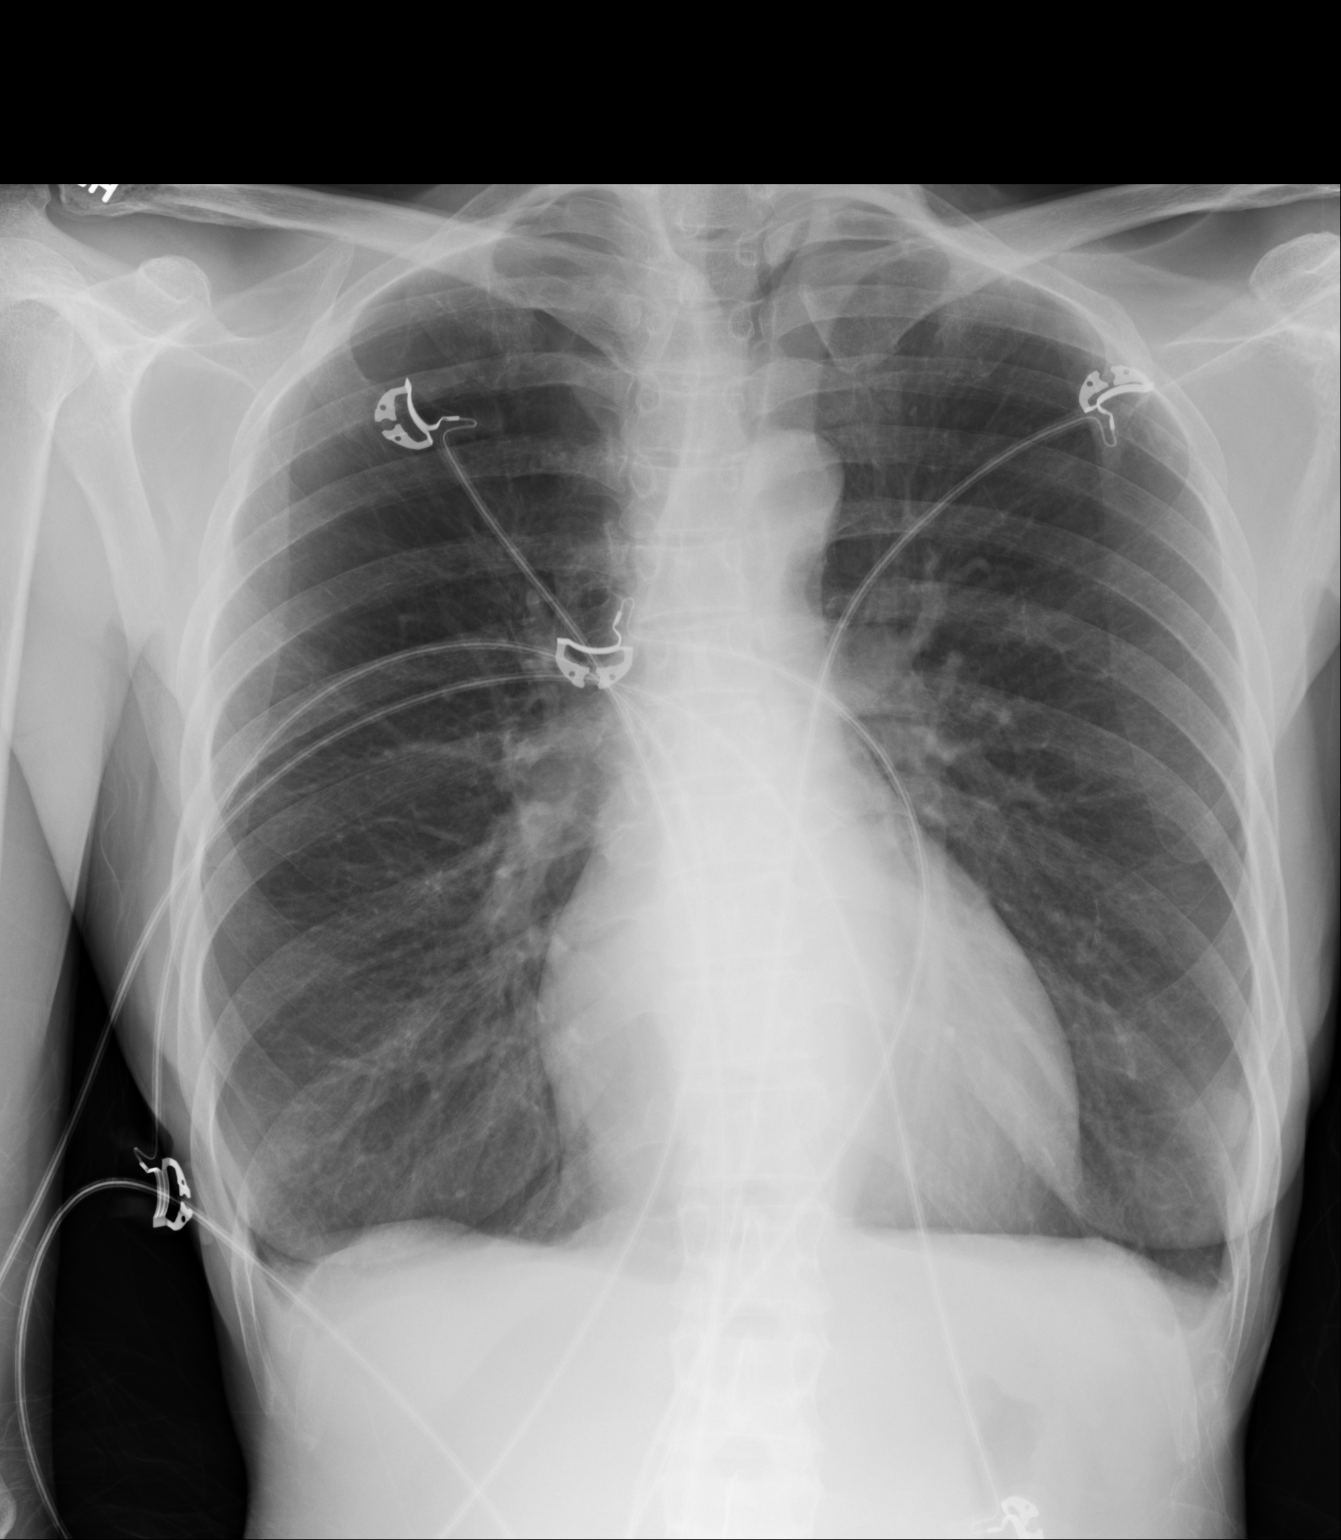

[1 of 1 positions shown; findings below may reference images not displayed]

FINDINGS: Stable cardiac and mediastinal contours. The heart is within normal
limits for size. Mild pulmonary hyperinflation, central bronchitic
change and interstitial prominence are stable to slightly improved
compared to prior. No evidence of focal airspace consolidation,
edema, pleural effusion or pneumothorax. Prominent nipple shadows.
No acute osseous abnormality.
IMPRESSION: 1. No evidence of acute cardiopulmonary process.
2. Mild hyperinflation, bronchitic change and interstitial
prominence are similar to slightly improved compared to prior
imaging.

## 2016-09-21 IMAGING — CR DG CHEST 1V PORT
1 series · 1 of 1 positions shown · non-contrast
Comparison: Chest radiograph October 23, 2014

CLINICAL DATA: Shortness of breath beginning this evening. History
of HIV and COPD.

EXAM:
PORTABLE CHEST 1 VIEW

[portable]
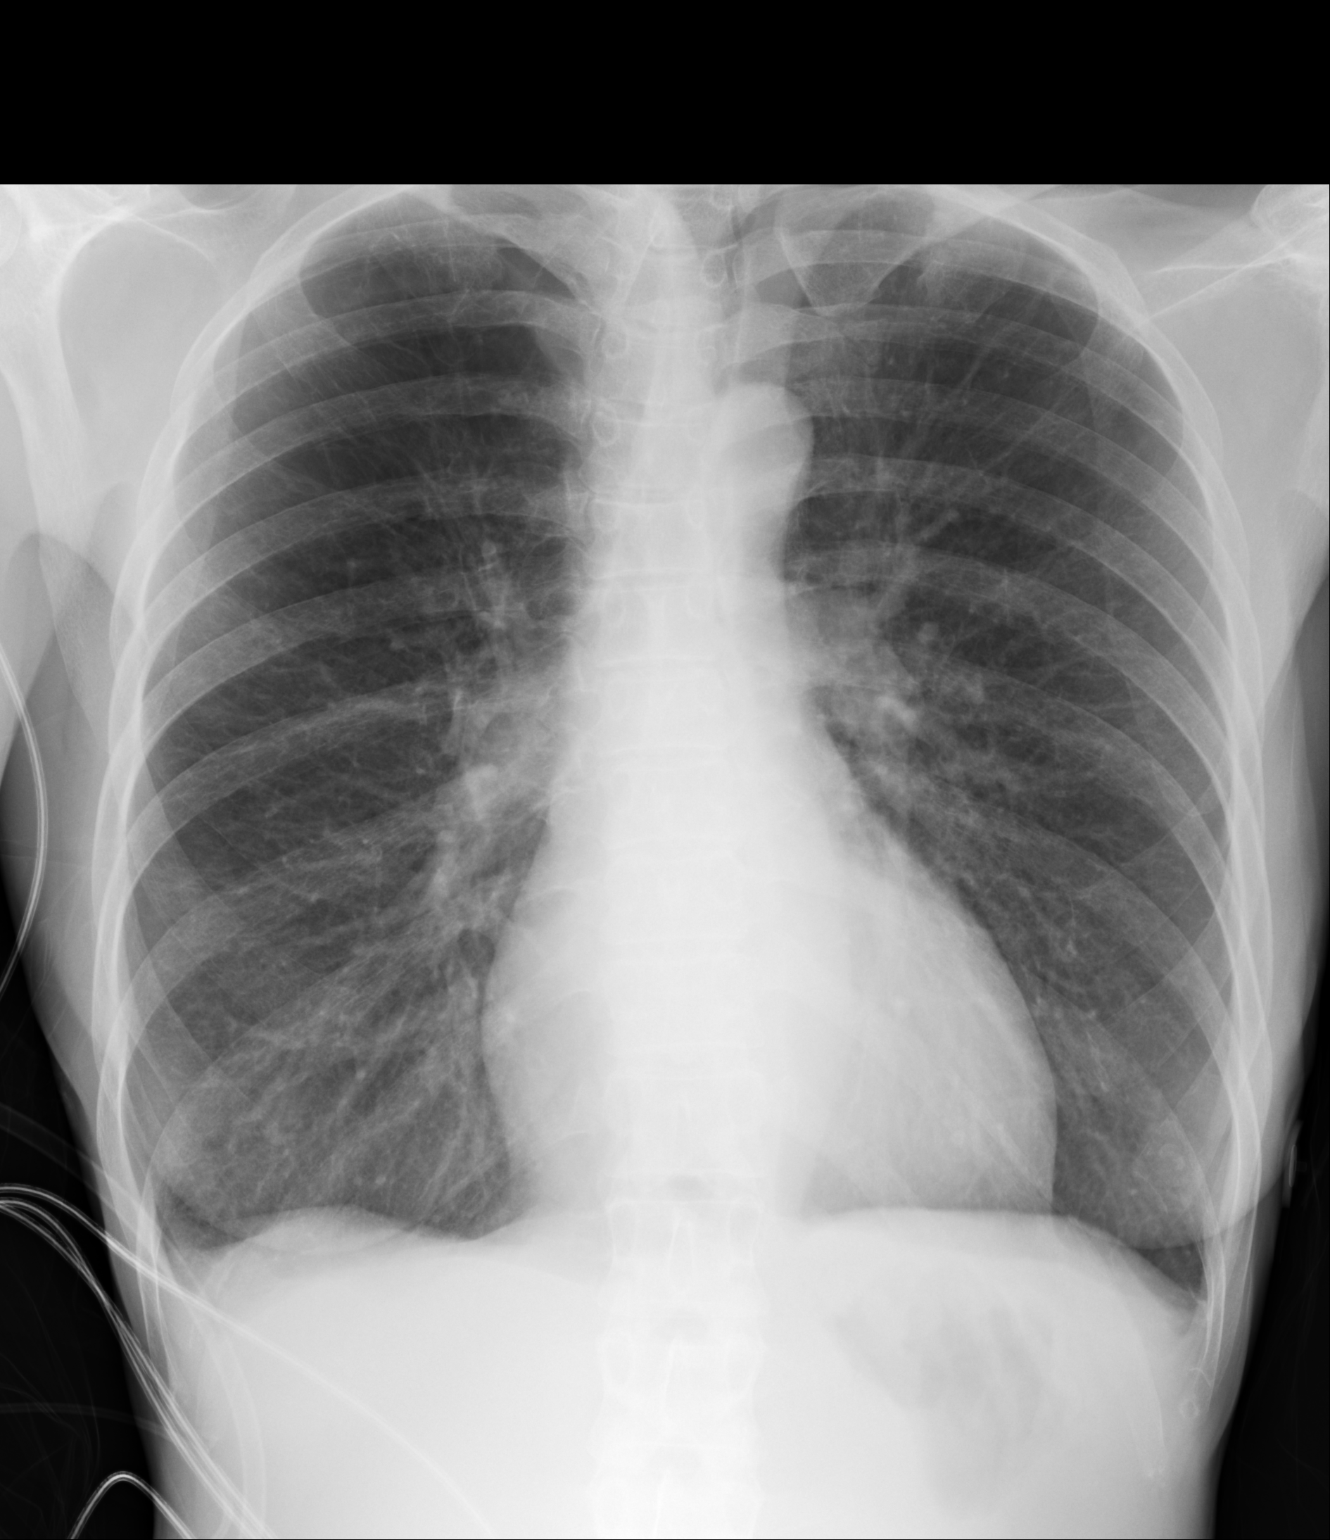

[1 of 1 positions shown; findings below may reference images not displayed]

FINDINGS: Cardiomediastinal silhouette is normal. The lungs are clear without
pleural effusions or focal consolidations. Similarly increased lung
volumes compatible with history of COPD. Trachea projects midline
and there is no pneumothorax. Soft tissue planes and included
osseous structures are non-suspicious.
IMPRESSION: Similar pulmonary hyperinflation compatible with COPD, no
superimposed acute cardiopulmonary process.

## 2018-03-13 ENCOUNTER — Other Ambulatory Visit: Payer: Self-pay | Admitting: Family Medicine

## 2018-03-13 DIAGNOSIS — Z1231 Encounter for screening mammogram for malignant neoplasm of breast: Secondary | ICD-10-CM

## 2018-03-24 ENCOUNTER — Encounter: Payer: Self-pay | Admitting: *Deleted

## 2019-07-28 ENCOUNTER — Telehealth: Payer: Self-pay | Admitting: *Deleted

## 2019-07-28 NOTE — Telephone Encounter (Signed)
Received referral for low dose lung cancer screening CT scan. Message left at phone number listed in EMR for patient to call me back to facilitate scheduling scan.  

## 2019-12-03 ENCOUNTER — Telehealth: Payer: Self-pay

## 2019-12-03 NOTE — Telephone Encounter (Signed)
Contacted patient for lung CT screening clinic based on referral from Dr. Ephraim Hamburger.  Message left with patient to call Glenna Fellows, lung navigator to scheduled CT scan.

## 2019-12-10 ENCOUNTER — Telehealth: Payer: Self-pay

## 2019-12-10 NOTE — Telephone Encounter (Signed)
Contacted patient for lung CT screening program based on referral from Dr. Ephraim Hamburger.  Message left with patient to call Glenna Fellows, lung navigator to schedule CT scan.

## 2020-01-05 ENCOUNTER — Encounter: Payer: Self-pay | Admitting: *Deleted

## 2020-10-18 ENCOUNTER — Encounter: Payer: Self-pay | Admitting: Physician Assistant

## 2020-12-19 ENCOUNTER — Other Ambulatory Visit: Payer: Self-pay

## 2020-12-20 ENCOUNTER — Ambulatory Visit: Payer: Medicaid Other | Admitting: Gastroenterology

## 2021-04-24 ENCOUNTER — Other Ambulatory Visit: Payer: Self-pay

## 2021-04-24 ENCOUNTER — Ambulatory Visit: Payer: Medicaid Other | Admitting: Family Medicine

## 2021-04-24 ENCOUNTER — Encounter: Payer: Self-pay | Admitting: Family Medicine

## 2021-04-24 DIAGNOSIS — Z113 Encounter for screening for infections with a predominantly sexual mode of transmission: Secondary | ICD-10-CM | POA: Diagnosis not present

## 2021-04-24 DIAGNOSIS — Z202 Contact with and (suspected) exposure to infections with a predominantly sexual mode of transmission: Secondary | ICD-10-CM

## 2021-04-24 DIAGNOSIS — Z21 Asymptomatic human immunodeficiency virus [HIV] infection status: Secondary | ICD-10-CM

## 2021-04-24 DIAGNOSIS — L409 Psoriasis, unspecified: Secondary | ICD-10-CM

## 2021-04-24 LAB — WET PREP FOR TRICH, YEAST, CLUE
Trichomonas Exam: NEGATIVE
Yeast Exam: NEGATIVE

## 2021-04-24 MED ORDER — GENTAMICIN SULFATE 40 MG/ML IJ SOLN
240.0000 mg | Freq: Once | INTRAMUSCULAR | Status: AC
  Administered 2021-04-24: 240 mg via INTRAMUSCULAR

## 2021-04-24 MED ORDER — AZITHROMYCIN 500 MG PO TABS
2000.0000 mg | ORAL_TABLET | Freq: Once | ORAL | Status: AC
  Administered 2021-04-24: 2000 mg via ORAL

## 2021-04-24 NOTE — Progress Notes (Signed)
Pt here for STD screening.  Wet mount results reviewed.  Medication dispensed and Gentamicin 240 mg given IM without any complications.  Windle Guard, RN ? ?

## 2021-04-24 NOTE — Progress Notes (Signed)
[facility] Department ? ?STI clinic/screening visit ?[address] ?[place] [address] ?[phone] ? ?Subjective:  ?Whitney [name] is a 51 y.o. female being seen today for an STI screening visit. The patient reports they do have symptoms.  Patient reports that they do not desire a pregnancy in the next year.   They reported they are not interested in discussing contraception today.   ? ?[name] last menstrual period was 08/29/2014. ? ? ?Patient has the following medical conditions:   ?Patient Active Problem List  ? Diagnosis Date Noted  ? Acute respiratory failure with hypoxia (HCC) 10/02/2014  ? Acute bronchitis 10/02/2014  ? Asymptomatic HIV infection (HCC) 10/02/2014  ? Malnutrition of moderate degree (HCC) 10/01/2014  ? COPD exacerbation (HCC) 09/30/2014  ? PNA (pneumonia) 09/11/2014  ? Chronic pain of multiple joints 06/03/2012  ? Depression 06/03/2012  ? GERD (gastroesophageal reflux disease) 06/03/2012  ? Tobacco dependence 06/03/2012  ? Chronic pain of multiple joints 06/03/2012  ? Depression 06/03/2012  ? GERD (gastroesophageal reflux disease) 06/03/2012  ? Tobacco dependence 06/03/2012  ? Excessive and frequent menstruation 10/31/2010  ? Excessive and frequent menstruation 10/31/2010  ? Psoriasis and similar disorder 01/02/2010  ? Psoriasis and similar disorder 01/02/2010  ? ? ?Chief Complaint  ?Patient presents with  ? SEXUALLY TRANSMITTED DISEASE  ?  screening  ? ? ?HPI ? ?Patient reports here for treatment for exposure to gonorrhea, pt had sex with untreated partner  ? ?Pt is HIV positive  ?Patient reports last pap was many years ago  ? ?Screening for MPX risk: ?Does the patient have an unexplained rash? No ?Is the patient MSM? No ?Does the patient endorse multiple sex partners or anonymous sex partners? Yes ?Did the patient have close or sexual contact with a person diagnosed with MPX? No ?Has the patient traveled outside the [place] where MPX is endemic? No ?Is there a high  clinical suspicion for MPX-- evidenced by one of the following No ? -Unlikely to be chickenpox ? -Lymphadenopathy ? -Rash that present in same phase of evolution on any given body part ?See flowsheet for further details and programmatic requirements.  ? ? ?The following portions of the patient's history were reviewed and updated as appropriate: allergies, current medications, past medical history, past social history, past surgical history and problem list. ? ?Objective:  ?There were no vitals filed for this visit. ? ?Physical Exam ?Vitals and nursing note reviewed.  ?Constitutional:   ?   Appearance: Normal appearance.  ?HENT:  ?   Head: Normocephalic and atraumatic.  ?   Mouth/Throat:  ?   Mouth: Mucous membranes are moist.  ?   Pharynx: Oropharynx is clear. No oropharyngeal exudate or posterior oropharyngeal erythema.  ?Pulmonary:  ?   Effort: Pulmonary effort is normal.  ?Abdominal:  ?   General: Abdomen is flat.  ?   Palpations: There is no mass.  ?   Tenderness: There is no abdominal tenderness. There is no rebound.  ?Genitourinary: ?   General: Normal vulva.  ?   Exam position: Lithotomy position.  ?   Pubic Area: No rash or pubic lice.   ?   Labia:     ?   Right: No rash or lesion.     ?   Left: No rash or lesion.   ?   Vagina: Normal. No vaginal discharge, erythema, bleeding or lesions.  ?   Cervix: No cervical motion tenderness, discharge, friability, lesion or erythema.  ?  Uterus: Normal.   ?   Adnexa: Right adnexa normal and left adnexa normal.  ?   Rectum: Normal.  ?Lymphadenopathy:  ?   Head:  ?   Right side of head: No preauricular or posterior auricular adenopathy.  ?   Left side of head: No preauricular or posterior auricular adenopathy.  ?   Cervical: No cervical adenopathy.  ?   Upper Body:  ?   Right upper body: No supraclavicular or axillary adenopathy.  ?   Left upper body: No supraclavicular or axillary adenopathy.  ?   Lower Body: No right inguinal adenopathy. No left inguinal adenopathy.   ?Skin: ?   General: Skin is warm and dry.  ?   Findings: Rash present.  ? ?    ?   Comments: Pt has ~ 4-5 mm red papules on various places such as wrist, legs and trunk   ?Neurological:  ?   Mental Status: She is alert and oriented to person, place, and time.  ? ? ? ?Assessment and Plan:  ?Whitney [name] is a 51 y.o. female presenting to the [facility] Department for STI screening ? ?1. Screening examination for venereal disease ?Patient accepted all screenings including wet prep, and bloodwork for RPR.  ?Patient meets criteria for HepB screening? Yes. Ordered? No - declined  ?Patient meets criteria for HepC screening? Yes. Ordered? No - declined  ? ?Wet prep results neg    ?Treatment needed  ?Discussed time line for [facility] results and that patient will be called with positive results and encouraged patient to call if she had not heard in 2 weeks.  ?Counseled to return or seek care for continued or worsening symptoms ?Recommended condom use with all sex ? ?Patient is currently using Postpartum Sterilization, post menopausal  to prevent pregnancy.   ?- WET PREP FOR TRICH, YEAST, CLUE ?- Chlamydia/Gonorrhea [facility] ? ?2. Exposure to gonorrhea ?Treated as contact to gonorrhea and unknown chlamydia  ?Pt has PCN allergy  ?- azithromycin (ZITHROMAX) tablet 2,000 mg ?- gentamicin (GARAMYCIN) injection 240 mg ? ?3. Asymptomatic HIV infection (HCC) ?Pt declined testing and gets tested regularly at her ID provider  ?Is medication managed.   ? ?4. Psoriasis and similar disorder ?Pt reports that  red papules on arms and legs are from psoriasis out break.    ? ? ? ? ?No follow-ups on file. ? ?No future appointments. ? ?Wendi Snipes, FNP ?

## 2021-05-03 ENCOUNTER — Telehealth: Payer: Self-pay

## 2021-05-03 NOTE — Telephone Encounter (Addendum)
Calling pt regarding positive gonorrhea result from 04/24/21 oropharyngeal specimen. ?(FYI standing order for oral infection: Patients with a positive test for gonorrhea should be counseled re: the need for TOC 14 days after oral infection and 3 months after treatment. ) ? ?Pt received tx at last appt on 04/24/21.  Needs TOC appt around 05/08/21. ? ? ?Phone call to pt at [phone]. Left message on voicemail that RN with [facility] is calling regarding TR. Please call Fannie Gathright at [phone]. ? ?(MyChart is pending.) ?

## 2021-05-04 NOTE — Telephone Encounter (Signed)
Phone call to pt at [phone]. Left message on voicemail that RN with [facility] is calling regarding TR. Please call Tamaya Pun at [phone]. ?  ?

## 2021-05-04 NOTE — Telephone Encounter (Signed)
Phone call received back from pt. Pt confirmed password from last visit. Pt counseled regarding positive GC test result from throat specimen and medication she received that day should tx GC.  Pt further counseled that TOC should be completed 14 days after tx of the throat infection.  ?Pt scheduled to [facility] on 05/08/21 for TOC for oropharyngeal site. ?

## 2021-05-08 ENCOUNTER — Ambulatory Visit: Payer: Medicaid Other

## 2021-12-12 ENCOUNTER — Other Ambulatory Visit: Payer: Self-pay | Admitting: Otolaryngology

## 2021-12-12 DIAGNOSIS — H9042 Sensorineural hearing loss, unilateral, left ear, with unrestricted hearing on the contralateral side: Secondary | ICD-10-CM

## 2022-01-02 ENCOUNTER — Inpatient Hospital Stay: Admission: RE | Admit: 2022-01-02 | Payer: Medicaid Other | Source: Ambulatory Visit

## 2022-01-23 ENCOUNTER — Other Ambulatory Visit: Payer: Medicaid Other

## 2022-03-20 ENCOUNTER — Inpatient Hospital Stay
Admission: RE | Admit: 2022-03-20 | Discharge: 2022-03-20 | Disposition: A | Discharge status: Home / Self Care | Payer: Medicaid Other | Source: Ambulatory Visit | Attending: Otolaryngology | Admitting: Otolaryngology

## 2022-04-18 ENCOUNTER — Encounter (HOSPITAL_BASED_OUTPATIENT_CLINIC_OR_DEPARTMENT_OTHER): Payer: Self-pay | Admitting: Pulmonary Disease

## 2022-04-18 ENCOUNTER — Other Ambulatory Visit: Payer: Self-pay | Admitting: Pulmonary Disease

## 2022-04-18 ENCOUNTER — Encounter (HOSPITAL_BASED_OUTPATIENT_CLINIC_OR_DEPARTMENT_OTHER): Payer: Self-pay | Admitting: Internal Medicine

## 2022-04-18 DIAGNOSIS — J449 Chronic obstructive pulmonary disease, unspecified: Secondary | ICD-10-CM

## 2022-04-20 ENCOUNTER — Other Ambulatory Visit: Payer: Self-pay | Admitting: Pulmonary Disease

## 2022-04-20 DIAGNOSIS — F17200 Nicotine dependence, unspecified, uncomplicated: Secondary | ICD-10-CM

## 2022-08-07 NOTE — ED Provider Notes (Signed)
 [facility] Emergency Department Provider Note    ED Clinical Impression    Final diagnoses:  Fever, unspecified fever cause (Primary)      Impression, Medical Decision Making, Progress Notes and Critical Care    Impression, Differential Diagnosis and Plan of Care  Whitney [name] is a 52 y.o. female with a history of COPD, HIV (HIV RNA undetectable in 2016 unclear if on ART), and ongoing tobacco use presenting to the ED today for a few days of intermittent subjective fevers in the context of testing positive for a UTI but not picking up her antibiotics in May. However, her UTI symptoms have completely resolved..  On exam, the patient is alert, oriented, and in NAD. Vitals are WNL. Exam is notable for LCTAB, otherwise normal.  Differential includes viral illness, occult pneumonia, occult infection, persistent UTI  Plan for CXR, UA, Viral Swabs, and CBC.  Independent Interpretation of Studies  I have independently interpreted the following studies: X-ray(s): I personally reviewed the chest x-ray which demonstrates no acute cardiopulmonary process.  Emphysematous changes are noted.  Considerations Regarding Disposition/Escalation of Care and Critical Care  Indications for observation/admission (or consideration of observation/admission) and/or appropriateness for outpatient management: There are no indications for admission for this patient.  She can be managed as an outpatient Patient/Family/Caregiver Discussions: I had multiple conversations at the bedside with the patient regarding my history and physical exam finding, planned workup, workup results, aftercare instructions. Diagnostic Tests Considered But Not Done: None Prescription Drugs Provided or Considered But Not Given: None Social Determinants of Health which significantly affected care: None  Additional Progress Notes  The patient is well-appearing.  We will check a urine and a chest x-ray.  UA  shows no evidence of UTI.  Chest x-ray is pending at the time of signout to Dr. Ezella Guillaume at 2300 hrs.  Portions of this record have been created using Scientist, clinical (histocompatibility and immunogenetics). Dictation errors have been sought, but may not have been identified and corrected.  See chart and resident provider documentation for details.  ____________________________________________      History    Reason for Visit Genitourinary Problem  HPI  Whitney [name] is a 52 y.o. female with past medical history of COPD, HIV (HIV RNA undetectable in 2016 unclear if on ART), and ongoing tobacco use who presents for fever. The patient reports that she was diagnosed with a UTI in May. However, she was unable to pick up her prescription 2/2 to financial reason. She states her UTI symptoms have completely resolved but she is still having intermittent subjective fevers. Denies abdominal pain, dysuria, or hematuria.   Outside Historian(s) None  External Records Reviewed 04/18/22 [facility] Pulmonology Note for past medical history.   Past Medical History:  Diagnosis Date  . Acid reflux   . Anemia   . COPD (chronic obstructive pulmonary disease) (CMS-HCC)   . Depression   . Gonorrhea   . HIV disease (CMS-HCC)   . Peripheral neuropathy    Patient Active Problem List  Diagnosis  . Excessive or frequent menstruation  . Human immunodeficiency virus (HIV) disease (CMS-HCC)  . Psoriasis and similar disorder  . COPD (chronic obstructive pulmonary disease) (CMS-HCC)  . Tobacco dependence  . GERD (gastroesophageal reflux disease)  . Depression/PTSD/Anxiety  . Chronic pain of multiple joints   Past Surgical History:  Procedure Laterality Date  . CESAREAN SECTION    . PR UPPER GI ENDOSCOPY,BIOPSY N/A 06/17/2014   Procedure: UGI ENDOSCOPY; WITH BIOPSY, SINGLE  OR MULTIPLE;  Surgeon: Olam CHRISTELLA Henle, MD;  [facility];  Service: Gastroenterology  . TUBAL LIGATION     No current  facility-administered medications for this encounter.  Current Outpatient Medications:  .  abacavir -dolutegravir -lamivudine  (TRIUMEQ) 600-50-300 mg Tab tablet, Take 1 tablet by mouth daily., Disp: 30 tablet, Rfl: 6 .  atorvastatin (LIPITOR) 80 MG tablet, Take 1 tablet (80 mg total) by mouth daily., Disp: 30 tablet, Rfl: 0 .  clopidogreL (PLAVIX) 75 mg tablet, Take 1 tablet (75 mg total) by mouth daily for 21 days., Disp: 21 tablet, Rfl: 0 .  DULoxetine  (CYMBALTA ) 30 MG capsule, TAKE 2 CAPSULES BY MOUTH DAILY, Disp: 60 capsule, Rfl: 0 .  ferrous sulfate  325 (65 FE) MG tablet, Take by mouth Three (3) times a day. Frequency:PHARMDIR   Dosage:0.0     Instructions:  Note:take one three times daily with a meal Dose: 325(65) MG, Disp: , Rfl:  .  gabapentin (NEURONTIN) 300 MG capsule, Take 1 capsule (300 mg total) by mouth Three (3) times a day., Disp: 90 capsule, Rfl: 6 .  ipratropium-albuterol  (COMBIVENT) 18-103 mcg/actuation inhaler, Four (4) times a day. Frequency:QID   Dosage:0.0     Instructions:  Note:Dose: 18-103MCG, Disp: , Rfl:  .  norethindrone (AYGESTIN) 5 mg tablet, Take 1 tablet (5 mg total) by mouth daily. Frequency:QD   Dosage:5   MG  Instructions:  Note:Dose: 5MG , Disp: 30 tablet, Rfl: 12 .  omeprazole (PRILOSEC) 40 MG capsule, Take 1 capsule (40 mg total) by mouth Two (2) times a day., Disp: 60 capsule, Rfl: 6  Allergies Aspirin and Penicillins  Family History  Problem Relation Age of Onset  . Cancer Maternal Aunt        GYN cancer  . Cancer Father        lung cancer  . Heart attack Father   . Heart disease Father   . Stroke Father 47   Social History Social History   Tobacco Use  . Smoking status: Every Day    Current packs/day: 0.50    Average packs/day: 0.5 packs/day for 30.2 years (15.1 ttl pk-yrs)    Types: Cigarettes    Start date: 06/03/1992  . Smokeless tobacco: Never  Substance Use Topics  . Alcohol use: Yes    Alcohol/week: 0.0 standard drinks of alcohol     Comment: rare 1-2 times a year  . Drug use: Yes    Types: Marijuana    Comment: smokes marijuana twice a week    Physical Exam   This provider entered the patient's room: Yes:  If this provider did not enter the room, a comprehensive physical exam was not able to be performed due to increased infection risk to themselves, other providers, staff and other patients), as well as to conserve personal protective equipment (PPE) utilization during the COVID-19 pandemic.  If this provider did enter the patient room, the following was PPE worn: Surgical mask, eye protection and gloves   ED Triage Vitals [08/07/22 1944]  Enc Vitals Group     BP 131/88     Heart Rate 100     Resp 18     Temp 36.9 C (98.5 F)     Temp Source Oral     SpO2 98 %     Weight 40.4 kg (89 lb)     Height 1.626 m (5' 4)   Constitutional: Alert and oriented. Well appearing and in no distress. Eyes: Conjunctivae are normal. ENT  Head: Normocephalic and atraumatic.      Nose: No congestion.      Mouth/Throat: Mucous membranes are moist.      Neck: No stridor. Hematological/Lymphatic/Immunilogical: No cervical lymphadenopathy. Cardiovascular: Normal rate, regular rhythm. Normal and symmetric distal pulses are present in all extremities. Respiratory: Normal respiratory effort. Breath sounds are normal. Gastrointestinal: Soft and nontender. There is no CVA tenderness. Genitourinary: Deferred  Musculoskeletal: Normal range of motion in all extremities.      Right lower leg: No tenderness or edema.      Left lower leg: No tenderness or edema. Neurologic: Normal speech and language. No gross focal neurologic deficits are appreciated. Skin: Skin is warm, dry and intact. No rash noted. Psychiatric: Mood and affect are normal. Speech and behavior are normal.   Radiology   XR Chest 2 views  Final Result  Emphysema without focal airspace consolidation.       Labs   COVID, flu, RSV testing is negative.   Urinalysis demonstrates less than 1 red, 4 whites, negative leukocyte Estrace, negative nitrite.  CBC demonstrates white blood cell count of 10.4, hemoglobin 11.3, hematocrit 35.5, platelet count 356.  Documentation assistance was provided by Margretta Martinez, Scribe, on August 07, 2022 at 10:04 PM for Slater Schmitz, MD.   Documentation assistance was provided by the scribe in my presence.  The documentation recorded by the scribe has been reviewed by me and accurately reflects the services I personally performed.         Schmitz Slater Fees, MD 08/08/22 2029

## 2022-08-16 DIAGNOSIS — R1310 Dysphagia, unspecified: Secondary | ICD-10-CM | POA: Diagnosis not present

## 2022-08-16 DIAGNOSIS — R634 Abnormal weight loss: Secondary | ICD-10-CM | POA: Diagnosis not present

## 2022-08-21 ENCOUNTER — Other Ambulatory Visit: Payer: Self-pay

## 2022-08-27 ENCOUNTER — Encounter: Payer: Self-pay | Admitting: Gastroenterology

## 2022-08-27 ENCOUNTER — Ambulatory Visit (INDEPENDENT_AMBULATORY_CARE_PROVIDER_SITE_OTHER): Payer: 59 | Admitting: Gastroenterology

## 2022-08-27 VITALS — BP 141/98 | HR 94 | Temp 98.1°F | Ht 64.0 in | Wt 88.0 lb

## 2022-08-27 DIAGNOSIS — R1319 Other dysphagia: Secondary | ICD-10-CM | POA: Diagnosis not present

## 2022-08-27 NOTE — Progress Notes (Unsigned)
Whitney Mood MD, MRCP(U.K) [address]  [address]  [place], [place] [address]  Main: [phone]  Fax: [phone]   Gastroenterology Consultation  Referring Provider:     Preston [name]* Primary Care Physician:  Whitney Fleeting, MD Primary Gastroenterologist:  Dr. Wyline [name]  Reason for Consultation:     Dysphagia        HPI:   Whitney [name] is a 52 y.o. y/o female referred for consultation & management  by Dr. Neita [name], Whitney Raddle, MD.      She has been referred to see me for this.  Back in March 2024.  Seen by [facility] Whitney [name] physicians back in May 2024 there was concern for dysphagia after recent stroke in 2020.  Was recommend to have ENT evaluation and modified barium swallow.  Risk of aspiration was mild was given recommendations by swallowing therapist.  She says that she been having difficulty swallowing since the time she had the stroke in 2020 has not changed since points to her neck and states the food gets stuck or whether it does not affect liquids only affects solids.  Does not complain of any coughing.  She does not have any teeth in her oral cavity has false teeth but is not using it presently no unintentional weight loss.  No family history of esophageal cancer.  No other symptoms  Past Medical History:  Diagnosis Date   Anxiety    COPD (chronic obstructive pulmonary disease) (HCC)    GERD (gastroesophageal reflux disease)    HIV disease (HCC)    PTSD (post-traumatic stress disorder)     Past Surgical History:  Procedure Laterality Date   CESAREAN SECTION     TUBAL LIGATION      Prior to Admission medications   Medication Sig Start Date End Date Taking? Authorizing Provider  abacavir-dolutegravir-lamiVUDine (TRIUMEQ) 600-50-300 MG tablet Take 1 tablet by mouth daily. Patient not taking: Reported on 04/24/2021    [provider]  atorvastatin (LIPITOR) 80 MG tablet Take 1 tablet by mouth daily. 12/09/18   [provider]  bictegravir-emtricitabine-tenofovir AF (BIKTARVY) 50-200-25 MG TABS tablet Take by mouth daily.    [provider]  diphenhydrAMINE (BENADRYL) 25 MG tablet Take 25 mg by mouth every 6 (six) hours as needed for allergies.    [provider]  DULoxetine (CYMBALTA) 30 MG capsule Take 60 mg by mouth daily. Patient not taking: Reported on 04/24/2021    [provider]  ezetimibe (ZETIA) 10 MG tablet Take 10 mg by mouth daily. 04/06/21   [provider]  feeding supplement, ENSURE ENLIVE, (ENSURE ENLIVE) LIQD Take 237 mLs by mouth 2 (two) times daily between meals. Patient not taking: Reported on 04/24/2021 10/02/14   Whitney Caper, MD  ferrous sulfate 325 (65 FE) MG tablet Take 1 tablet (325 mg total) by mouth 2 (two) times daily with a meal. Patient not taking: Reported on 04/24/2021 01/30/16   Whitney Sickle, MD  Fluticasone-Salmeterol (ADVAIR) 250-50 MCG/DOSE AEPB Inhale 1 puff into the lungs 2 (two) times daily.    [provider]  gabapentin (NEURONTIN) 300 MG capsule Take 1 capsule by mouth in the morning, at noon, and at bedtime. 04/12/14   [provider]  Ipratropium-Albuterol (COMBIVENT IN) Four (4) times a day. Frequency:QID   Dosage:0.0     Instructions:  Note:Dose: 18-103MCG 01/08/12   [provider]  levofloxacin (LEVAQUIN) 750 MG tablet Take 1 tablet (750 mg total) by mouth daily. Patient  not taking: Reported on 04/24/2021 09/12/14   Whitney Saran, MD  methylPREDNISolone (MEDROL DOSEPAK) 4 MG TBPK tablet follow package directions Patient not taking: Reported on 04/24/2021 10/02/14   Whitney Caper, MD  montelukast (SINGULAIR) 10 MG tablet Take 10 mg by mouth daily. 02/02/21   [provider]  norethindrone (AYGESTIN) 5 MG tablet Take by mouth. Patient not taking: Reported on 04/24/2021 06/28/14   [provider]  omeprazole (PRILOSEC) 40 MG capsule Take 1 capsule by mouth 2 times daily at 12 noon and 4 pm. Patient  not taking: Reported on 04/24/2021 06/07/14   [provider]  pantoprazole (PROTONIX) 40 MG tablet Take 40 mg by mouth daily. 12/19/20   [provider]  triamcinolone ointment (KENALOG) 0.1 % Apply topically. 03/30/21   [provider]    Family History  Problem Relation Age of Onset   Whitney [name]'s disease Father    Whitney [name] Father    Hypertension Mother      Social History   Tobacco Use   Smoking status: Every Day    Packs/day: 0.25    Years: 36.00    Additional pack years: 0.00    Total pack years: 9.00    Types: Cigarettes   Smokeless tobacco: Never  Vaping Use   Vaping Use: Never used  Substance Use Topics   Alcohol use: Yes    Comment: ocassionally   Drug use: Yes    Types: Marijuana    Comment: daily    Allergies as of 08/27/2022 - Review Complete 04/24/2021  Allergen Reaction Noted   Penicillins Hives 09/11/2014   Aspirin Rash and Other (See Comments) 09/11/2014    Review of Systems:    All systems reviewed and negative except where noted in HPI.   Physical Exam:  LMP 08/29/2014  [name] last menstrual period was 08/29/2014. Psych:  Alert and cooperative. Normal [name] and affect. General:   Appears very thin and cachectic Head:  Normocephalic and atraumatic. Eyes:  Sclera clear, no icterus.   Conjunctiva pink. Ears:  Normal auditory acuity. Abdomen:  Normal bowel sounds.  No bruits.  Soft, non-tender and non-distended without masses, hepatosplenomegaly or hernias noted.  No guarding or rebound tenderness.    Neurologic:  Alert and oriented x3;  grossly normal neurologically. Psych:  Alert and cooperative. Normal [name] and affect.  Imaging Studies: No results found.  Assessment and Plan:   COILA Whitney [name] is a 52 y.o. y/o female has been referred for dysphagia for over 4 years in duration since her stroke points to her neck and indicates that is where the solids get stuck.  No issues of dysphagia with liquids.  Denies any coughing.   Her symptoms are not typical of transfer dysphagia.  Hence we will proceed with modified barium swallow and barium swallow with tablet based on the results we will decide about upper endoscopy.    Follow up in 6 weeks with PA  Dr Whitney Mood MD,MRCP(U.K)

## 2022-08-27 NOTE — Patient Instructions (Signed)
For the barium swallow,  you will need to fast 3 hours prior at [facility].   Special Scheduling will contact you to schedule your Modified barium swallow. You are able to eat a regular breakfast, light lunch.

## 2022-08-28 ENCOUNTER — Telehealth: Payer: Self-pay | Admitting: Gastroenterology

## 2022-08-28 NOTE — Telephone Encounter (Signed)
Patient called in to reschedule her exam she has a fever of 100, she stated that it just started today.

## 2022-08-29 ENCOUNTER — Other Ambulatory Visit: Payer: 59

## 2022-08-31 NOTE — Telephone Encounter (Signed)
Send letter to contact us when she decides to schedule and CC to PCP

## 2022-08-31 NOTE — Telephone Encounter (Signed)
Dr. Tobi Bastos, just FYI: The patient had called to cancel her barium swallow and modified barium swallow was never scheduled and now that I am calling her to schedule, she is not answering my calls. I do not think that she wants to do these exams.

## 2022-09-04 NOTE — Telephone Encounter (Signed)
Letter will be sent.

## 2022-09-14 DIAGNOSIS — R634 Abnormal weight loss: Secondary | ICD-10-CM | POA: Diagnosis not present

## 2022-09-14 DIAGNOSIS — R1310 Dysphagia, unspecified: Secondary | ICD-10-CM | POA: Diagnosis not present

## 2022-10-11 DIAGNOSIS — R1310 Dysphagia, unspecified: Secondary | ICD-10-CM | POA: Diagnosis not present

## 2022-10-11 DIAGNOSIS — R634 Abnormal weight loss: Secondary | ICD-10-CM | POA: Diagnosis not present

## 2022-10-23 ENCOUNTER — Telehealth: Payer: Self-pay

## 2022-10-23 ENCOUNTER — Ambulatory Visit: Payer: 59 | Admitting: Gastroenterology

## 2022-10-23 NOTE — Telephone Encounter (Signed)
Called patient to let her know that we will have to cancel her appointment since she cancelled her barium swallow and modified barium swallow test. Patient was given the phone numbers for her to call and reschedule her appointments and l asked to give [place] a call so we know when she would have them done and so we could reschedule her follow up appointment with Dr. Tobi Bastos. Patient stated that she would call and then call us to reschedule her follow up appointment. Patient understood and had no further questions.

## 2022-11-05 ENCOUNTER — Ambulatory Visit
Admission: RE | Admit: 2022-11-05 | Discharge: 2022-11-05 | Disposition: A | Discharge status: Home / Self Care | Payer: Medicaid Other | Source: Ambulatory Visit | Attending: Gastroenterology | Admitting: Gastroenterology

## 2022-11-05 DIAGNOSIS — R1319 Other dysphagia: Secondary | ICD-10-CM | POA: Diagnosis present

## 2022-11-06 ENCOUNTER — Other Ambulatory Visit: Payer: Self-pay

## 2022-11-06 ENCOUNTER — Telehealth: Payer: Self-pay

## 2022-11-06 DIAGNOSIS — R1319 Other dysphagia: Secondary | ICD-10-CM

## 2022-11-06 NOTE — Telephone Encounter (Signed)
Patient called back

## 2022-11-06 NOTE — Telephone Encounter (Signed)
Scheduled patient for 12/13/2022 went over instructions, mailed them and sent to [facility]

## 2022-11-06 NOTE — Progress Notes (Signed)
Stricture on barium needs EGD

## 2022-11-06 NOTE — Telephone Encounter (Signed)
Called and left a message for call back  

## 2022-11-06 NOTE — Telephone Encounter (Signed)
-----   Message from [name] sent at 11/06/2022  8:29 AM EDT ----- Stricture on barium needs EGD

## 2022-12-06 ENCOUNTER — Encounter: Payer: Self-pay | Admitting: Gastroenterology

## 2022-12-08 DIAGNOSIS — R1310 Dysphagia, unspecified: Secondary | ICD-10-CM | POA: Diagnosis not present

## 2022-12-08 DIAGNOSIS — R634 Abnormal weight loss: Secondary | ICD-10-CM | POA: Diagnosis not present

## 2022-12-13 ENCOUNTER — Encounter
Admission: RE | Disposition: A | Discharge status: Home / Self Care | Payer: Self-pay | Source: Home / Self Care | Attending: Gastroenterology

## 2022-12-13 ENCOUNTER — Other Ambulatory Visit: Payer: Self-pay

## 2022-12-13 ENCOUNTER — Encounter: Payer: Self-pay | Admitting: Gastroenterology

## 2022-12-13 ENCOUNTER — Ambulatory Visit: Payer: Medicaid Other | Admitting: Anesthesiology

## 2022-12-13 ENCOUNTER — Ambulatory Visit
Admission: RE | Admit: 2022-12-13 | Discharge: 2022-12-13 | Disposition: A | Discharge status: Home / Self Care | Payer: Medicaid Other | Attending: Gastroenterology | Admitting: Gastroenterology

## 2022-12-13 DIAGNOSIS — R1310 Dysphagia, unspecified: Secondary | ICD-10-CM | POA: Insufficient documentation

## 2022-12-13 DIAGNOSIS — K222 Esophageal obstruction: Secondary | ICD-10-CM | POA: Insufficient documentation

## 2022-12-13 DIAGNOSIS — I10 Essential (primary) hypertension: Secondary | ICD-10-CM | POA: Insufficient documentation

## 2022-12-13 DIAGNOSIS — J449 Chronic obstructive pulmonary disease, unspecified: Secondary | ICD-10-CM | POA: Diagnosis not present

## 2022-12-13 DIAGNOSIS — F17210 Nicotine dependence, cigarettes, uncomplicated: Secondary | ICD-10-CM | POA: Diagnosis not present

## 2022-12-13 DIAGNOSIS — F419 Anxiety disorder, unspecified: Secondary | ICD-10-CM | POA: Diagnosis not present

## 2022-12-13 DIAGNOSIS — Z79624 Long term (current) use of inhibitors of nucleotide synthesis: Secondary | ICD-10-CM | POA: Insufficient documentation

## 2022-12-13 DIAGNOSIS — Z79899 Other long term (current) drug therapy: Secondary | ICD-10-CM | POA: Diagnosis not present

## 2022-12-13 DIAGNOSIS — K449 Diaphragmatic hernia without obstruction or gangrene: Secondary | ICD-10-CM | POA: Insufficient documentation

## 2022-12-13 DIAGNOSIS — F4310 Post-traumatic stress disorder, unspecified: Secondary | ICD-10-CM | POA: Insufficient documentation

## 2022-12-13 DIAGNOSIS — K219 Gastro-esophageal reflux disease without esophagitis: Secondary | ICD-10-CM | POA: Diagnosis not present

## 2022-12-13 DIAGNOSIS — R1319 Other dysphagia: Secondary | ICD-10-CM

## 2022-12-13 HISTORY — DX: Essential (primary) hypertension: I10

## 2022-12-13 HISTORY — DX: Cerebral infarction, unspecified: I63.9

## 2022-12-13 HISTORY — PX: ESOPHAGOGASTRODUODENOSCOPY (EGD) WITH PROPOFOL: SHX5813

## 2022-12-13 LAB — POCT PREGNANCY, URINE: Preg Test, Ur: NEGATIVE

## 2022-12-13 SURGERY — ESOPHAGOGASTRODUODENOSCOPY (EGD) WITH PROPOFOL
Anesthesia: General

## 2022-12-13 MED ORDER — PANTOPRAZOLE SODIUM 40 MG PO TBEC
40.0000 mg | DELAYED_RELEASE_TABLET | Freq: Two times a day (BID) | ORAL | 1 refills | Status: AC
Start: 2022-12-13 — End: 2023-12-13

## 2022-12-13 MED ORDER — GLYCOPYRROLATE 0.2 MG/ML IJ SOLN
INTRAMUSCULAR | Status: DC | PRN
  Administered 2022-12-13: .2 mg via INTRAVENOUS

## 2022-12-13 MED ORDER — PROPOFOL 500 MG/50ML IV EMUL
INTRAVENOUS | Status: DC | PRN
  Administered 2022-12-13: 150 ug/kg/min via INTRAVENOUS

## 2022-12-13 MED ORDER — LIDOCAINE HCL (CARDIAC) PF 100 MG/5ML IV SOSY
PREFILLED_SYRINGE | INTRAVENOUS | Status: DC | PRN
  Administered 2022-12-13: 40 mg via INTRAVENOUS

## 2022-12-13 MED ORDER — SODIUM CHLORIDE 0.9 % IV SOLN: INTRAVENOUS | Status: DC

## 2022-12-13 MED ORDER — PROPOFOL 10 MG/ML IV BOLUS
INTRAVENOUS | Status: DC | PRN
  Administered 2022-12-13: 50 mg via INTRAVENOUS

## 2022-12-13 NOTE — Anesthesia Preprocedure Evaluation (Signed)
Anesthesia Evaluation  Patient identified by MRN, date of birth, ID band Patient awake    Reviewed: Allergy & Precautions, NPO status , [name] Chart, lab work & pertinent test results  History of Anesthesia Complications Negative for: history of anesthetic complications  Airway Mallampati: III  TM Distance: <3 FB Neck ROM: full    Dental  (+) Missing   Pulmonary COPD, Current Smoker and Patient abstained from smoking.   Pulmonary exam normal        Cardiovascular Exercise Tolerance: Good hypertension, Normal cardiovascular exam     Neuro/Psych  PSYCHIATRIC DISORDERS      CVA    GI/Hepatic Neg liver ROS,GERD  Controlled,,  Endo/Other  negative endocrine ROS    Renal/GU negative Renal ROS  negative genitourinary   Musculoskeletal   Abdominal   Peds  Hematology negative hematology ROS (+)   Anesthesia Other Findings Patient reports that they do not think that any food or pills are stuck in their throat at this time  Past Medical History: No date: Anxiety No date: COPD (chronic obstructive pulmonary disease) (HCC) No date: GERD (gastroesophageal reflux disease) No date: HIV disease (HCC) No date: Hypertension No date: PTSD (post-traumatic stress disorder) No date: Stroke [facility])  Past Surgical History: No date: CESAREAN SECTION No date: TUBAL LIGATION     Reproductive/Obstetrics negative OB ROS                             Anesthesia Physical Anesthesia Plan  ASA: 3  Anesthesia Plan: General   Post-op Pain Management:    Induction: Intravenous  PONV Risk Score and Plan: Propofol infusion and TIVA  Airway Management Planned: Natural Airway and Nasal Cannula  Additional Equipment:   Intra-op Plan:   Post-operative Plan:   Informed Consent: I have reviewed the patients History and Physical, chart, labs and discussed the procedure including the risks, benefits and  alternatives for the proposed anesthesia with the patient or authorized representative who has indicated his/her understanding and acceptance.     Dental Advisory Given  Plan Discussed with: Anesthesiologist, CRNA and Surgeon  Anesthesia Plan Comments: (Patient consented for risks of anesthesia including but not limited to:  - adverse reactions to medications - risk of airway placement if required - damage to eyes, teeth, lips or other oral mucosa - nerve damage due to positioning  - sore throat or hoarseness - Damage to heart, brain, nerves, lungs, other parts of body or loss of life  Patient voiced understanding and assent.)       Anesthesia Quick Evaluation

## 2022-12-13 NOTE — H&P (Signed)
[name], MD [address], [place], [place], [address] [address], [place], [place], [address] Phone: [phone]  Fax: [phone]  Primary Care Physician:  Preston Fleeting, MD   Pre-Procedure History & Physical: HPI:  Whitney [name] is a 52 y.o. female is here for an endoscopy    Past Medical History:  Diagnosis Date   Anxiety    COPD (chronic obstructive pulmonary disease) (HCC)    GERD (gastroesophageal reflux disease)    HIV disease (HCC)    PTSD (post-traumatic stress disorder)     Past Surgical History:  Procedure Laterality Date   CESAREAN SECTION     TUBAL LIGATION      Prior to Admission medications   Medication Sig Start Date End Date Taking? Authorizing Provider  amLODipine (NORVASC) 10 MG tablet Take 10 mg by mouth daily. 06/27/22  Yes [provider]  bictegravir-emtricitabine-tenofovir AF (BIKTARVY) 50-200-25 MG TABS tablet Take by mouth daily.   Yes [provider]  ezetimibe (ZETIA) 10 MG tablet Take 10 mg by mouth daily. 04/06/21  Yes [provider]  gabapentin (NEURONTIN) 300 MG capsule Take 1 capsule by mouth in the morning, at noon, and at bedtime. 04/12/14  Yes [provider]  pantoprazole (PROTONIX) 40 MG tablet Take 40 mg by mouth daily. 12/19/20  Yes [provider]  tiotropium (SPIRIVA) 18 MCG inhalation capsule Place 18 mcg into inhaler and inhale daily.   Yes [provider]  abacavir-dolutegravir-lamiVUDine (TRIUMEQ) 600-50-300 MG tablet Take 1 tablet by mouth daily.    [provider]  atorvastatin (LIPITOR) 80 MG tablet Take 1 tablet by mouth daily. 12/09/18   [provider]  diphenhydrAMINE (BENADRYL) 25 MG tablet Take 25 mg by mouth every 6 (six) hours as needed for allergies.    [provider]  DULoxetine (CYMBALTA) 30 MG capsule Take 60 mg by mouth daily. Patient not taking: Reported on 12/06/2022    [provider]  feeding supplement, ENSURE ENLIVE, (ENSURE ENLIVE) LIQD Take 237 mLs by mouth 2 (two) times daily between meals. 10/02/14   Whitney Caper, MD  ferrous sulfate 325 (65 FE) MG tablet Take 1 tablet (325 mg total) by mouth 2 (two) times daily with a meal. Patient not taking: Reported on 12/06/2022 01/30/16   Whitney Sickle, MD  fluticasone [facility]) 50 MCG/ACT nasal spray Place 2 sprays into both nostrils daily as needed. 07/25/22   [provider]  Fluticasone-Salmeterol (ADVAIR) 250-50 MCG/DOSE AEPB Inhale 1 puff into the lungs 2 (two) times daily.    [provider]  Ipratropium-Albuterol (COMBIVENT IN) Four (4) times a day. Frequency:QID   Dosage:0.0     Instructions:  Note:Dose: 18-103MCG 01/08/12   [provider]  levofloxacin (LEVAQUIN) 750 MG tablet Take 1 tablet (750 mg total) by mouth daily. Patient not taking: Reported on 12/06/2022 09/12/14   Whitney Saran, MD  Loratadine 10 MG CHEW Chew 1 tablet by mouth daily as needed. 07/25/22   [provider]  losartan (COZAAR) 50 MG tablet Take 50 mg by mouth daily. Patient not taking: Reported on 12/06/2022 07/25/22   [provider]  methylPREDNISolone (MEDROL DOSEPAK) 4 MG TBPK tablet follow package directions Patient not taking: Reported on 12/06/2022 10/02/14   Whitney Caper, MD  montelukast (SINGULAIR) 10 MG tablet Take 10 mg by mouth daily. Patient not taking: Reported on 12/06/2022 02/02/21   [provider]  norethindrone (AYGESTIN) 5 MG tablet Take by  mouth. 06/28/14   [provider]  omeprazole (PRILOSEC) 40 MG capsule Take 1 capsule by mouth 2 times daily at 12 noon and 4 pm. 06/07/14   [provider]  triamcinolone ointment (KENALOG) 0.1 % Apply topically. 03/30/21   [provider]    Allergies as of 11/06/2022 - Review Complete 08/27/2022  Allergen Reaction Noted   Penicillins Hives 09/11/2014   Aspirin Rash and Other (See Comments)  09/11/2014    Family History  Problem Relation Age of Onset   Parkinson's disease Father    CAD Father    Hypertension Mother     Social History   Socioeconomic History   Marital status: Legally Separated    Spouse name: Not on file   Number of children: Not on file   Years of education: Not on file   Highest education level: Not on file  Occupational History   Not on file  Tobacco Use   Smoking status: Every Day    Current packs/day: 0.25    Average packs/day: 0.3 packs/day for 36.0 years (9.0 ttl pk-yrs)    Types: Cigarettes   Smokeless tobacco: Never  Vaping Use   Vaping status: Never Used  Substance and Sexual Activity   Alcohol use: Yes    Comment: ocassionally   Drug use: Yes    Types: Marijuana    Comment: daily occ   Sexual activity: Yes    Birth control/protection: Condom, Surgical, Post-menopausal  Other Topics Concern   Not on file  Social History Narrative   Not on file   Social Determinants of Health   Financial Resource Strain: Not on file  Food Insecurity: Not on file  Transportation Needs: Not on file  Physical Activity: Not on file  Stress: Not on file  Social Connections: Not on file  Intimate Partner Violence: Not At Risk (04/24/2021)   Humiliation, Afraid, Rape, and Kick questionnaire    Fear of Current or Ex-Partner: No    Emotionally Abused: No    Physically Abused: No    Sexually Abused: No    Review of Systems: See HPI, otherwise negative ROS  Physical Exam: LMP 08/29/2014  General:   Alert,  pleasant and cooperative in NAD Head:  Normocephalic and atraumatic. Neck:  Supple; no masses or thyromegaly. Lungs:  Clear throughout to auscultation, normal respiratory effort.    Heart:  +S1, +S2, Regular rate and rhythm, No edema. Abdomen:  Soft, nontender and nondistended. Normal bowel sounds, without guarding, and without rebound.   Neurologic:  Alert and  oriented x4;  grossly normal neurologically.  Impression/Plan: Whitney [name] is here for an endoscopy  to be performed for  evaluation of dysphagia    Risks, benefits, limitations, and alternatives regarding endoscopy have been reviewed with the patient.  Questions have been answered.  All parties agreeable.   [name], MD  12/13/2022, 7:47 AM

## 2022-12-13 NOTE — Transfer of Care (Signed)
Immediate Anesthesia Transfer of Care Note  Patient: [name]  Procedure(s) Performed: ESOPHAGOGASTRODUODENOSCOPY (EGD) WITH PROPOFOL  Patient Location: PACU  Anesthesia Type:General  Level of Consciousness: drowsy  Airway & Oxygen Therapy: Patient Spontanous Breathing  Post-op Assessment: Report given to RN and Post -op Vital signs reviewed and stable  Post vital signs: stable  Last Vitals:  Vitals Value Taken Time  BP 87/70 12/13/22 0837  Temp    Pulse 90 12/13/22 0838  Resp 29 12/13/22 0838  SpO2 100 % 12/13/22 0838  Vitals shown include unfiled device data.  Last Pain:  Vitals:   12/13/22 0759  TempSrc: Temporal  PainSc: 0-No pain         Complications: No notable events documented.

## 2022-12-13 NOTE — Anesthesia Postprocedure Evaluation (Signed)
Anesthesia Post Note  Patient: Whitney [name]  Procedure(s) Performed: ESOPHAGOGASTRODUODENOSCOPY (EGD) WITH PROPOFOL  Patient location during evaluation: Endoscopy Anesthesia Type: General Level of consciousness: awake and alert Pain management: pain level controlled Vital Signs Assessment: post-procedure vital signs reviewed and stable Respiratory status: spontaneous breathing, nonlabored ventilation, respiratory function stable and patient connected to nasal cannula oxygen Cardiovascular status: blood pressure returned to baseline and stable Postop Assessment: no apparent nausea or vomiting Anesthetic complications: no   No notable events documented.   Last Vitals:  Vitals:   12/13/22 0846 12/13/22 0852  BP: 112/82 112/82  Pulse:    Resp:    Temp:    SpO2:      Last Pain:  Vitals:   12/13/22 0846  TempSrc:   PainSc: 0-No pain                 Cleda Mccreedy Jhoanna Heyde

## 2022-12-13 NOTE — Plan of Care (Signed)
[facility] Tonsillectomy/Adenoidectomy, Postoperative PEDS care plan entered in error.

## 2022-12-13 NOTE — Op Note (Signed)
[facility] Gastroenterology Patient Name: Whitney [name] Procedure Date: 12/13/2022 8:14 AM MRN: [id number] Account #: [id number] Date of Birth: 11/09/1970 Admit Type: Outpatient Age: 52 Room: [facility] ENDO ROOM 1 Gender: Female Note Status: Finalized Instrument Name: Upper Endoscope [id number] Procedure:             Upper GI endoscopy Indications:           Dysphagia Providers:             [name] MD, MD Referring MD:          [name] MD, MD (Referring MD), Presley Raddle                         Revelo (Referring MD) Medicines:             Propofol per Anesthesia, Monitored Anesthesia Care Complications:         No immediate complications. Procedure:             Pre-Anesthesia Assessment:                        - Prior to the procedure, a History and Physical was                         performed, and patient medications, allergies and                         sensitivities were reviewed. The patient's tolerance                         of previous anesthesia was reviewed.                        - The risks and benefits of the procedure and the                         sedation options and risks were discussed with the                         patient. All questions were answered and informed                         consent was obtained.                        - ASA Grade Assessment: [ASA Grade].                        After obtaining informed consent, the endoscope was                         passed under direct vision. Throughout the procedure,                         the patient's blood pressure, pulse, and oxygen                         saturations were monitored continuously. The Endoscope  was introduced through the mouth, and advanced to the                         third part of duodenum. The upper GI endoscopy was                         accomplished with ease. The patient tolerated the                         procedure  well. Findings:      One benign-appearing, intrinsic severe (stenosis; an endoscope cannot       pass) stenosis was found 18 to 20 cm from the incisors. This stenosis       measured 8 mm (inner diameter) x 1 cm (in length). The stenosis was       traversed after downsizing scope and dilating. A TTS dilator was passed       through the scope. Dilation with an 09-27-08 mm balloon dilator was       performed to 10 mm. The dilation site was examined and showed moderate       mucosal disruption.      The examined duodenum was normal.      A medium-sized hiatal hernia was present.      The cardia and gastric fundus were normal on retroflexion. Impression:            - Benign-appearing esophageal stenosis. Dilated.                        - Normal examined duodenum.                        - Medium-sized hiatal hernia.                        - No specimens collected. Recommendation:        - Discharge patient to home (with escort).                        - Resume previous diet.                        - Repeat upper endoscopy in 2 weeks for retreatment.                        - Prilosec 40 mg BId for 3 months Procedure Code(s):     --- Professional ---                        [phone], Esophagogastroduodenoscopy, flexible,                         transoral; with transendoscopic balloon dilation of                         esophagus (less than 30 mm diameter) Diagnosis Code(s):     --- Professional ---                        K22.2, Esophageal obstruction  K44.9, Diaphragmatic hernia without obstruction or                         gangrene                        R13.10, Dysphagia, unspecified CPT copyright 2022 American Medical Association. All rights reserved. The codes documented in this report are preliminary and upon coder review may  be revised to meet current compliance requirements. [name], MD [name] MD, MD 12/13/2022 8:35:09 AM This report has been signed  electronically. Number of Addenda: 0 Note Initiated On: 12/13/2022 8:14 AM Estimated Blood Loss:  Estimated blood loss: none.      [facility]

## 2022-12-15 DIAGNOSIS — J449 Chronic obstructive pulmonary disease, unspecified: Secondary | ICD-10-CM | POA: Diagnosis not present

## 2023-01-09 ENCOUNTER — Telehealth: Payer: Self-pay

## 2023-01-09 ENCOUNTER — Other Ambulatory Visit: Payer: Self-pay

## 2023-01-09 DIAGNOSIS — R1310 Dysphagia, unspecified: Secondary | ICD-10-CM | POA: Diagnosis not present

## 2023-01-09 DIAGNOSIS — R1319 Other dysphagia: Secondary | ICD-10-CM

## 2023-01-09 DIAGNOSIS — R634 Abnormal weight loss: Secondary | ICD-10-CM | POA: Diagnosis not present

## 2023-01-09 NOTE — Telephone Encounter (Signed)
Called patient to let her know that Dr. Tobi Bastos wanted to do another EGD on her to see if she needs dilation in her esophagus. Patient agreed and requested to have it done on a Monday. The first available Monday was until 02/11/2023 and she stated that it was fine for her.

## 2023-01-15 DIAGNOSIS — J449 Chronic obstructive pulmonary disease, unspecified: Secondary | ICD-10-CM | POA: Diagnosis not present

## 2023-02-07 DIAGNOSIS — R634 Abnormal weight loss: Secondary | ICD-10-CM | POA: Diagnosis not present

## 2023-02-07 DIAGNOSIS — R1310 Dysphagia, unspecified: Secondary | ICD-10-CM | POA: Diagnosis not present

## 2023-02-08 ENCOUNTER — Encounter: Payer: Self-pay | Admitting: Gastroenterology

## 2023-02-11 ENCOUNTER — Ambulatory Visit: Admission: RE | Admit: 2023-02-11 | Payer: Medicaid Other | Source: Home / Self Care | Admitting: Gastroenterology

## 2023-02-11 SURGERY — ESOPHAGOGASTRODUODENOSCOPY (EGD) WITH PROPOFOL
Anesthesia: General

## 2023-02-14 DIAGNOSIS — J449 Chronic obstructive pulmonary disease, unspecified: Secondary | ICD-10-CM | POA: Diagnosis not present

## 2023-03-12 DIAGNOSIS — R1310 Dysphagia, unspecified: Secondary | ICD-10-CM | POA: Diagnosis not present

## 2023-03-12 DIAGNOSIS — R634 Abnormal weight loss: Secondary | ICD-10-CM | POA: Diagnosis not present

## 2023-03-17 DIAGNOSIS — J449 Chronic obstructive pulmonary disease, unspecified: Secondary | ICD-10-CM | POA: Diagnosis not present

## 2023-04-17 DIAGNOSIS — J449 Chronic obstructive pulmonary disease, unspecified: Secondary | ICD-10-CM | POA: Diagnosis not present

## 2023-05-15 DIAGNOSIS — J449 Chronic obstructive pulmonary disease, unspecified: Secondary | ICD-10-CM | POA: Diagnosis not present

## 2023-06-15 DIAGNOSIS — J449 Chronic obstructive pulmonary disease, unspecified: Secondary | ICD-10-CM | POA: Diagnosis not present

## 2023-09-01 ENCOUNTER — Emergency Department

## 2023-09-01 ENCOUNTER — Other Ambulatory Visit: Payer: Self-pay

## 2023-09-01 ENCOUNTER — Emergency Department: Admission: EM | Admit: 2023-09-01 | Discharge: 2023-09-01 | Disposition: A | Discharge status: Home / Self Care

## 2023-09-01 DIAGNOSIS — R1310 Dysphagia, unspecified: Secondary | ICD-10-CM | POA: Insufficient documentation

## 2023-09-01 DIAGNOSIS — I10 Essential (primary) hypertension: Secondary | ICD-10-CM | POA: Insufficient documentation

## 2023-09-01 DIAGNOSIS — R9389 Abnormal findings on diagnostic imaging of other specified body structures: Secondary | ICD-10-CM

## 2023-09-01 DIAGNOSIS — R918 Other nonspecific abnormal finding of lung field: Secondary | ICD-10-CM | POA: Diagnosis not present

## 2023-09-01 DIAGNOSIS — Z21 Asymptomatic human immunodeficiency virus [HIV] infection status: Secondary | ICD-10-CM | POA: Diagnosis not present

## 2023-09-01 DIAGNOSIS — R221 Localized swelling, mass and lump, neck: Secondary | ICD-10-CM

## 2023-09-01 DIAGNOSIS — R0602 Shortness of breath: Secondary | ICD-10-CM | POA: Diagnosis not present

## 2023-09-01 DIAGNOSIS — R059 Cough, unspecified: Secondary | ICD-10-CM | POA: Insufficient documentation

## 2023-09-01 DIAGNOSIS — J449 Chronic obstructive pulmonary disease, unspecified: Secondary | ICD-10-CM | POA: Insufficient documentation

## 2023-09-01 LAB — COMPREHENSIVE METABOLIC PANEL WITH GFR
ALT: 15 U/L (ref 0–44)
AST: 22 U/L (ref 15–41)
Albumin: 3.3 g/dL — ABNORMAL LOW (ref 3.5–5.0)
Alkaline Phosphatase: 81 U/L (ref 38–126)
Anion gap: 10 (ref 5–15)
BUN: 8 mg/dL (ref 6–20)
CO2: 31 mmol/L (ref 22–32)
Calcium: 9.5 mg/dL (ref 8.9–10.3)
Chloride: 99 mmol/L (ref 98–111)
Creatinine, Ser: 0.61 mg/dL (ref 0.44–1.00)
GFR, Estimated: >60 mL/min (ref >60)
Glucose, Bld: 135 mg/dL — ABNORMAL HIGH (ref 70–99)
Potassium: 3.1 mmol/L — ABNORMAL LOW (ref 3.5–5.1)
Sodium: 140 mmol/L (ref 135–145)
Total Bilirubin: 0.8 mg/dL (ref 0.0–1.2)
Total Protein: 6.9 g/dL (ref 6.5–8.1)

## 2023-09-01 LAB — CBC WITH DIFFERENTIAL/PLATELET
Abs Immature Granulocytes: 0.02 K/uL (ref 0.00–0.07)
Basophils Absolute: 0 K/uL (ref 0.0–0.1)
Basophils Relative: 0 %
Eosinophils Absolute: 0 K/uL (ref 0.0–0.5)
Eosinophils Relative: 0 %
HCT: 42.4 % (ref 36.0–46.0)
Hemoglobin: 12.8 g/dL (ref 12.0–15.0)
Immature Granulocytes: 0 %
Lymphocytes Relative: 13 %
Lymphs Abs: 1.1 K/uL (ref 0.7–4.0)
MCH: 26 pg (ref 26.0–34.0)
MCHC: 30.2 g/dL (ref 30.0–36.0)
MCV: 86 fL (ref 80.0–100.0)
Monocytes Absolute: 0.4 K/uL (ref 0.1–1.0)
Monocytes Relative: 4 %
Neutro Abs: 6.9 K/uL (ref 1.7–7.7)
Neutrophils Relative %: 83 %
Platelets: 253 K/uL (ref 150–400)
RBC: 4.93 MIL/uL (ref 3.87–5.11)
RDW: 14 % (ref 11.5–15.5)
WBC: 8.4 K/uL (ref 4.0–10.5)
nRBC: 0 % (ref 0.0–0.2)

## 2023-09-01 LAB — RESP PANEL BY RT-PCR (RSV, FLU A&B, COVID)  RVPGX2
Influenza A by PCR: NEGATIVE
Influenza B by PCR: NEGATIVE
Resp Syncytial Virus by PCR: NEGATIVE
SARS Coronavirus 2 by RT PCR: NEGATIVE

## 2023-09-01 LAB — LIPASE, BLOOD: Lipase: 28 U/L (ref 11–51)

## 2023-09-01 LAB — TROPONIN I (HIGH SENSITIVITY): Troponin I (High Sensitivity): 7 ng/L (ref <18)

## 2023-09-01 LAB — BRAIN NATRIURETIC PEPTIDE: B Natriuretic Peptide: 106.2 pg/mL — ABNORMAL HIGH (ref 0.0–100.0)

## 2023-09-01 LAB — GROUP A STREP BY PCR: Group A Strep by PCR: NOT DETECTED

## 2023-09-01 MED ORDER — KETOROLAC TROMETHAMINE 15 MG/ML IJ SOLN
15.0000 mg | Freq: Once | INTRAMUSCULAR | Status: AC
  Administered 2023-09-01: 15 mg via INTRAVENOUS
  Filled 2023-09-01: qty 1

## 2023-09-01 MED ORDER — AZITHROMYCIN 250 MG PO TABS: ORAL_TABLET | ORAL | 0 refills | Status: AC

## 2023-09-01 MED ORDER — ALUM & MAG HYDROXIDE-SIMETH 200-200-20 MG/5ML PO SUSP
30.0000 mL | Freq: Once | ORAL | Status: AC
  Administered 2023-09-01: 30 mL via ORAL
  Filled 2023-09-01: qty 30

## 2023-09-01 MED ORDER — ACETAMINOPHEN 160 MG/5ML PO SOLN
500.0000 mg | Freq: Once | ORAL | Status: DC
  Filled 2023-09-01: qty 20.3

## 2023-09-01 MED ORDER — ACETAMINOPHEN 160 MG/5ML PO SUSP: 500.0000 mg | Freq: Four times a day (QID) | ORAL | 0 refills | Status: DC | PRN

## 2023-09-01 MED ORDER — IPRATROPIUM-ALBUTEROL 0.5-2.5 (3) MG/3ML IN SOLN
3.0000 mL | Freq: Once | RESPIRATORY_TRACT | Status: AC
  Administered 2023-09-01: 3 mL via RESPIRATORY_TRACT
  Filled 2023-09-01: qty 3

## 2023-09-01 MED ORDER — LIDOCAINE VISCOUS HCL 2 % MT SOLN: 15.0000 mL | OROMUCOSAL | 0 refills | Status: DC | PRN

## 2023-09-01 MED ORDER — ALBUTEROL SULFATE 1.25 MG/3ML IN NEBU: 1.0000 | INHALATION_SOLUTION | Freq: Four times a day (QID) | RESPIRATORY_TRACT | 12 refills | Status: DC | PRN

## 2023-09-01 MED ORDER — LIDOCAINE VISCOUS HCL 2 % MT SOLN
15.0000 mL | Freq: Once | OROMUCOSAL | Status: AC
  Administered 2023-09-01: 15 mL via ORAL
  Filled 2023-09-01: qty 15

## 2023-09-01 MED ORDER — FAMOTIDINE 20 MG PO TABS: 20.0000 mg | ORAL_TABLET | Freq: Every day | ORAL | 0 refills | Status: DC

## 2023-09-01 MED ORDER — IOHEXOL 300 MG/ML  SOLN
75.0000 mL | Freq: Once | INTRAMUSCULAR | Status: AC | PRN
  Administered 2023-09-01: 75 mL via INTRAVENOUS

## 2023-09-01 MED ORDER — POTASSIUM CHLORIDE 20 MEQ PO PACK
40.0000 meq | PACK | Freq: Once | ORAL | Status: DC
  Filled 2023-09-01: qty 2

## 2023-09-01 NOTE — ED Triage Notes (Addendum)
 Pt here with SOB for weeks. Pt states she went to her primary and they gave her abx. Pt has a cough but denies a fever. Pt has a hx of COPD.  Pt had an episode of vomiting this morning.

## 2023-09-01 NOTE — ED Provider Notes (Signed)
 [facility] Provider Note    Event Date/Time   First MD Initiated Contact with Patient 09/01/23 [phone]     (approximate)   History   Shortness of Breath  Pt here with SOB for weeks. Pt states she went to her primary and they gave her abx. Pt has a cough but denies a fever. Pt has a hx of COPD.  Pt had an episode of vomiting this morning.   HPI Whitney [name] is a 53 y.o. female PMH COPD, HIV reportedly on Biktarvy and adherent to regimen, GERD, hypertension, PTSD, prior stroke presents for evaluation of shortness of breath, fatigue, cough, sore throat - Has been having issues with shortness of breath for the past several weeks though notes that that has worsened over the past 2 weeks.  No fevers.  Does endorse nonproductive cough.  States she has been on an unspecified antibiotic a couple weeks ago for reported ear infection.  No associated records available. - Started losing her voice over the past 2 days and endorses some new sore throat.  Also has been having more difficulty with swallowing though chronically has issues with this.  Tolerates liquids and soft foods but was unable to take mL or hard foods.  She is having more issues with regurgitation recently.    Per chart review, last CD4 count was 339 count 06/21/2023.     Physical Exam   Triage Vital Signs: BP (!) 179/100 (BP Location: Right Arm)   Pulse 100   Temp 97.9 F (36.6 C) (Oral)   Resp 20   LMP 08/29/2014   SpO2 100%     Most recent vital signs: Vitals:   09/01/23 0731 09/01/23 0959  BP: (!) 160/116 (!) 179/100  Pulse: 91 100  Resp: (!) 26 20  Temp: 97.9 F (36.6 C)   SpO2: 100% 100%     General: Awake, no distress. Very thin. HEENT: oropharynx with mild scattered white plaques in posterior.  No significant tonsillar swelling, erythema, exudate.  No trismus, pooling secretions.  No neck swelling, no stridor. CV:  Good peripheral perfusion. RRR, RP 2+ Resp:  Mild tachypnea,  good airflow throughout though somewhat expiratory wheezing in bilateral lower lung fields Abd:  No distention. Nontender to deep palpation throughout    ED Results / Procedures / Treatments   Labs (all labs ordered are listed, but only abnormal results are displayed) Labs Reviewed  COMPREHENSIVE METABOLIC PANEL WITH GFR - Abnormal; Notable for the following components:      Result Value   Potassium 3.1 (*)    Glucose, Bld 135 (*)    Albumin 3.3 (*)    All other components within normal limits  BRAIN NATRIURETIC PEPTIDE - Abnormal; Notable for the following components:   B Natriuretic Peptide 106.2 (*)    All other components within normal limits  RESP PANEL BY RT-PCR (RSV, FLU A&B, COVID)  [id number]  GROUP A STREP BY PCR  LIPASE, BLOOD  CBC WITH DIFFERENTIAL/PLATELET  TROPONIN I (HIGH SENSITIVITY)  TROPONIN I (HIGH SENSITIVITY)     EKG  See ED course below.    RADIOLOGY Radiology interpreted by myself and radiology report reviewed.  Notable for left lung mass lymphadenopathy concerning for possible metastases.    PROCEDURES:  Critical Care performed: No  Procedures   MEDICATIONS ORDERED IN ED: Medications  acetaminophen  (TYLENOL ) 160 MG/5ML solution 500 mg (has no administration in time range)  potassium chloride  (KLOR-CON ) packet 40 mEq (40 mEq Oral Not  Given 09/01/23 0958)  ketorolac  (TORADOL ) 15 MG/ML injection 15 mg (15 mg Intravenous Given 09/01/23 0808)  alum & mag hydroxide-simeth (MAALOX/MYLANTA) 200-200-20 MG/5ML suspension 30 mL (30 mLs Oral Given 09/01/23 0808)    And  lidocaine  (XYLOCAINE ) 2 % viscous mouth solution 15 mL (15 mLs Oral Given 09/01/23 0808)  ipratropium-albuterol  (DUONEB) 0.5-2.5 (3) MG/3ML nebulizer solution 3 mL (3 mLs Nebulization Given 09/01/23 0811)  iohexol  (OMNIPAQUE ) 300 MG/ML solution 75 mL (75 mLs Intravenous Contrast Given 09/01/23 0846)     IMPRESSION / MDM / ASSESSMENT AND PLAN / ED COURSE  I reviewed the triage vital signs  and the nursing notes.                              DDX/MDM/AP: Differential diagnosis includes, but is not limited to, COPD exacerbation, pneumonia, viral infection including COVID-19 or influenza, strep throat, consider candidal throat/esophageal infection given history of HIV and relatively low CD4 count about 2 months ago.  Consider worsening GERD.  No concern for deep neck space infection.  Consider worsening GERD.  Do not clinically suspect intra-abdominal pathology.  Doubt ACS.   Plan: - Labs - EKG - Chest x-ray - DuoNeb, GI cocktail, Tylenol , Toradol  - Reassess  [name] presentation is most consistent with acute presentation with potential threat to life or bodily function.  The patient is on the cardiac monitor to evaluate for evidence of arrhythmia and/or significant heart rate changes.  ED course below.  Workup concerning for very likely malignancy with left perihilar mass, lymphadenopathy, and tumors in the neck with notable airway narrowing --strongly advised admission though patient is adamant that she does not want to stay in the hospital.  Understands risks of decompensation from further airway narrowing and progression of malignancy.  Has decision-making capacity on my eval.  Is feeling much better after medications here.  Appears Maalox would interact with her Biktarvy, Rx viscous lidocaine  and liquid Tylenol .  Placed referral for oncology as well as GI and counseled patient to call her ENT with whom she is already established to schedule a close follow-up appointment.  Also recommended close PMD follow-up.  Noted patient can return to hospital at any time if she would like more expedited evaluation for closer care.  Discharge home per patient request, declining hospital admission multiple times despite my strong recommendation.  Clinical Course as of 09/01/23 1010  Sun Sep 01, 2023  9268 Ecg = sinus rhythm, rate 96, no gross ST elevation or depression, no significant  repolarization abnormality axis, normal intervals except for short PR.  No clear evidence of ischemia nor arrhythmia on my interpretation. [MM]  [phone] Call from radiology, L perihilar mass noted on CXR, recommend CT w/ contrast [MM]  0756 CXR: IMPRESSION: 1. New left suprahilar mass. Recommend CT of the chest with contrast for further evaluation. 2. Changes of COPD.   [MM]  0840 CMP with mild hypokalemia, otherwise unremarkable, will replete p.o. [MM]  0840 Strep swab negative Troponin normal CBC unremarkable [MM]  0937 CT chest: IMPRESSION: 1. Suprahilar mass of the left upper lobe measuring 3.9 x 3.8 cm, consistent with primary bronchogenic malignancy. 2. Enlarged mediastinal and left hilar lymph nodes, consistent with nodal metastatic disease. 3. Severe, bullous emphysema. 4. Coronary artery disease.   [MM]  0945 IMPRESSION: 1. Heterogeneously enhancing tumor mass (4.0 x 3.1 x 3.7 cm) extending from the vallecula to the vocal cords, involving the right and left supraglottic larynx  and aryepiglottic folds, causing supraglottic airway narrowing to less than 3 mm. 2. Heterogeneously enhancing right level 3 lymph node (2.1 x 1.4 x 2.2 cm) . her homogenously enhancing nodes are present, consistent with metastatic disease to the right neck. 3. Heterogeneously enhancing mass lesion in the mediastinum (3.9 x 3.1 cm) may represent metastatic disease to the chest versus a synchronous primary tumor. . 4. Severe central lobular emphysematous changes in both lungs.   [MM]  [phone] Patient reevaluated, discussed findings of high concern for malignancy.  Offered admission given her poor p.o. intake and new findings concerning for cancer with likely metastases, may also benefit from GI eval given her notable dysphagia.  He is already established with ENT.  Patient is adamant that she would not like to be admitted into the hospital and strongly prefers outpatient follow-up, is not amenable to  admission.  Does note that she feels much better after the Maalox.  Per patient wish, will proceed with discharge and plan for oncology referral as well as GI referral.  Counseled to call her ENT clinic for close reevaluation as well.  Rx viscous lidocaine  and liquid Tylenol  with close plan for above outpatient follow-up as well as PMD.  Strict ED return precautions in place, reiterated to patient that she can return to hospital at any time if she decides she would like more expedited evaluation and management.  Has decision-making capacity on my eval.  Discharged home. [MM]    Clinical Course User Index [MM] Clarine Ozell LABOR, MD     FINAL CLINICAL IMPRESSION(S) / ED DIAGNOSES   Final diagnoses:  Mass of left lung  Dysphagia, unspecified type  SOB (shortness of breath)     Rx / DC Orders   ED Discharge Orders          Ordered    Ambulatory referral to Hematology / Oncology       Comments: New left perihilar mass with concern for metastases and new dysphagia   09/01/23 1002    Ambulatory referral to Gastroenterology        09/01/23 1002    famotidine  (PEPCID ) 20 MG tablet  Daily        09/01/23 1002    acetaminophen  (TYLENOL  CHILDRENS) 160 MG/5ML suspension  Every 6 hours PRN        09/01/23 1002    azithromycin  (ZITHROMAX  Z-PAK) 250 MG tablet        09/01/23 1002    albuterol  (ACCUNEB ) 1.25 MG/3ML nebulizer solution  Every 6 hours PRN        09/01/23 1002    lidocaine  (XYLOCAINE ) 2 % solution  As needed        09/01/23 1002             Note:  This document was prepared using Dragon voice recognition software and may include unintentional dictation errors.   Clarine Ozell LABOR, MD 09/01/23 1010

## 2023-09-01 NOTE — Discharge Instructions (Addendum)
 Your evaluation in the emergency department was notable for a new mass in your left lung as well as multiple masses in your neck, and I suspect this may be contributing to your shortness of breath as well as difficulty swallowing.  I placed a referral for you to follow-up closely with your oncologist as well as a gastroenterologist, I also encourage you to follow-up as soon as possible with your primary care provider and ENT provider.  I prescribed you a numbing medication that you can use to help with your sore throat as well as a short course of antibiotics to treat a possible mild COPD exacerbation.  I also refilled your nebulizer solution.  Return to the emergency department with any new or worsening symptoms.

## 2023-09-01 NOTE — ED Notes (Signed)
 Pt is complaining of SOB and throat pain that has been going on for weeks. Pt advised she has a hx of COPD and needs oxygen at home, but her primary care won't prescribe her same. Pt's voice is hoarse sounding at this time. Pt in NAD and denies any needs. Pt's son at bedside.

## 2023-09-01 NOTE — ED Notes (Signed)
 Pt advised she has a hx of HTN and has not taken her medication today.

## 2023-09-03 ENCOUNTER — Encounter: Payer: Self-pay | Admitting: *Deleted

## 2023-09-03 DIAGNOSIS — R591 Generalized enlarged lymph nodes: Secondary | ICD-10-CM

## 2023-09-03 DIAGNOSIS — R918 Other nonspecific abnormal finding of lung field: Secondary | ICD-10-CM

## 2023-09-03 DIAGNOSIS — R221 Localized swelling, mass and lump, neck: Secondary | ICD-10-CM

## 2023-09-03 NOTE — Progress Notes (Signed)
 Referral received from ED for lung mass. Pt scheduled for new patient visit with Dr. Jacobo on Fri 7/18. Per Dr. Jacobo, pt will need further workup with PET scan. Order placed and will schedule once approved with insurance. Nothing further needed at this time.

## 2023-09-04 ENCOUNTER — Telehealth: Payer: Self-pay | Admitting: *Deleted

## 2023-09-04 ENCOUNTER — Ambulatory Visit
Admission: RE | Admit: 2023-09-04 | Discharge: 2023-09-04 | Disposition: A | Discharge status: Home / Self Care | Source: Ambulatory Visit | Attending: Oncology | Admitting: Oncology

## 2023-09-04 DIAGNOSIS — I700 Atherosclerosis of aorta: Secondary | ICD-10-CM | POA: Diagnosis not present

## 2023-09-04 DIAGNOSIS — R918 Other nonspecific abnormal finding of lung field: Secondary | ICD-10-CM | POA: Diagnosis present

## 2023-09-04 DIAGNOSIS — R7303 Prediabetes: Secondary | ICD-10-CM | POA: Diagnosis not present

## 2023-09-04 DIAGNOSIS — R591 Generalized enlarged lymph nodes: Secondary | ICD-10-CM | POA: Diagnosis not present

## 2023-09-04 DIAGNOSIS — I2510 Atherosclerotic heart disease of native coronary artery without angina pectoris: Secondary | ICD-10-CM | POA: Insufficient documentation

## 2023-09-04 DIAGNOSIS — M25511 Pain in right shoulder: Secondary | ICD-10-CM | POA: Insufficient documentation

## 2023-09-04 DIAGNOSIS — J439 Emphysema, unspecified: Secondary | ICD-10-CM | POA: Diagnosis not present

## 2023-09-04 DIAGNOSIS — R221 Localized swelling, mass and lump, neck: Secondary | ICD-10-CM

## 2023-09-04 DIAGNOSIS — Z21 Asymptomatic human immunodeficiency virus [HIV] infection status: Secondary | ICD-10-CM | POA: Insufficient documentation

## 2023-09-04 DIAGNOSIS — R0981 Nasal congestion: Secondary | ICD-10-CM | POA: Diagnosis not present

## 2023-09-04 LAB — GLUCOSE, CAPILLARY: Glucose-Capillary: 121 mg/dL — ABNORMAL HIGH (ref 70–99)

## 2023-09-04 MED ORDER — FLUDEOXYGLUCOSE F - 18 (FDG) INJECTION
5.2000 | Freq: Once | INTRAVENOUS | Status: AC | PRN
  Administered 2023-09-04: 5.95 via INTRAVENOUS

## 2023-09-04 NOTE — Telephone Encounter (Signed)
 The patient called stating that she has been having pain for 2 weeks and she cannot hardly sleep at all.  She wants to know if she can get pain medicine.  I see that she is a new patient however she came and got a PET scan today that [employer] approved.  I told the patient that probably we will not be able to give her any pain medicine because you are a new patient that we have never seen at the cancer center.

## 2023-09-04 NOTE — Telephone Encounter (Signed)
 Called the patient and got her voicemail and told her that I got the message from Dr. Georgina again that he cannot do any medications because technically you are not on the list that patients has been seen.  You can talk to him when you come to your first appointment on 7/18

## 2023-09-06 ENCOUNTER — Encounter: Payer: Self-pay | Admitting: *Deleted

## 2023-09-06 ENCOUNTER — Inpatient Hospital Stay: Admitting: Hospice and Palliative Medicine

## 2023-09-06 ENCOUNTER — Other Ambulatory Visit

## 2023-09-06 ENCOUNTER — Inpatient Hospital Stay: Attending: Oncology | Admitting: Oncology

## 2023-09-06 ENCOUNTER — Inpatient Hospital Stay

## 2023-09-06 ENCOUNTER — Encounter: Payer: Self-pay | Admitting: Oncology

## 2023-09-06 VITALS — BP 150/107 | HR 100 | Temp 97.3°F | Resp 16 | Wt 73.0 lb

## 2023-09-06 DIAGNOSIS — R591 Generalized enlarged lymph nodes: Secondary | ICD-10-CM

## 2023-09-06 DIAGNOSIS — Z793 Long term (current) use of hormonal contraceptives: Secondary | ICD-10-CM | POA: Insufficient documentation

## 2023-09-06 DIAGNOSIS — F17210 Nicotine dependence, cigarettes, uncomplicated: Secondary | ICD-10-CM | POA: Insufficient documentation

## 2023-09-06 DIAGNOSIS — Z79899 Other long term (current) drug therapy: Secondary | ICD-10-CM | POA: Insufficient documentation

## 2023-09-06 DIAGNOSIS — J387 Other diseases of larynx: Secondary | ICD-10-CM | POA: Insufficient documentation

## 2023-09-06 DIAGNOSIS — Z8673 Personal history of transient ischemic attack (TIA), and cerebral infarction without residual deficits: Secondary | ICD-10-CM | POA: Insufficient documentation

## 2023-09-06 DIAGNOSIS — B20 Human immunodeficiency virus [HIV] disease: Secondary | ICD-10-CM | POA: Diagnosis not present

## 2023-09-06 DIAGNOSIS — R918 Other nonspecific abnormal finding of lung field: Secondary | ICD-10-CM | POA: Diagnosis not present

## 2023-09-06 DIAGNOSIS — Z79624 Long term (current) use of inhibitors of nucleotide synthesis: Secondary | ICD-10-CM | POA: Insufficient documentation

## 2023-09-06 DIAGNOSIS — Z7951 Long term (current) use of inhaled steroids: Secondary | ICD-10-CM | POA: Diagnosis not present

## 2023-09-06 DIAGNOSIS — J439 Emphysema, unspecified: Secondary | ICD-10-CM | POA: Diagnosis not present

## 2023-09-06 MED ORDER — NALOXONE HCL 4 MG/0.1ML NA LIQD: NASAL | 0 refills | Status: DC

## 2023-09-06 MED ORDER — MORPHINE SULFATE (CONCENTRATE) 10 MG /0.5 ML PO SOLN: 5.0000 mg | Freq: Four times a day (QID) | ORAL | 0 refills | Status: DC | PRN

## 2023-09-06 NOTE — Progress Notes (Signed)
 Nutrition Assessment   Reason for Assessment:  Referral weight loss, dysphagia   ASSESSMENT:  53 year old female left perihilar mass, lymphadenopathy and tumors in the neck with notable airway narrowing.  Past medical history of HIV, COPD, GERD, HTN, PTSD, stroke.  Work up continues with oncology.  Plan not decided at this time.  Met with patient and son following MD visit.  Patient reports that lemon water saved her.  She says her appetite has improved over the last few days.  Has been able to swallow gravy biscuit, chef boryardee recently.  Previously not able to eat which led her to the hospital per patient.  Unable to determine how long this was going on prior to ED visit.  Patient at one time was receiving ensure (original?) from [facility](?) but now not receiving them because she was going to have to pay for them per her report.  Has not been able to get ensure shakes down but thinks she can now.     Nutrition Focused Physical Exam:   Orbital Region: severe Buccal Region: severe Upper Arm Region: severe Thoracic and Lumbar Region: severe Temple Region: severe Clavicle Bone Region: severe Shoulder and Acromion Bone Region: severe Scapular Bone Region: severe Dorsal Hand: severe Patellar Region: severe Anterior Thigh Region: severe Posterior Calf Region: severe Edema (RD assessment): none Hair: observed Eyes: observed Mouth: observed Skin: observed Nails: observed   Medications: reviewed   Labs: reviewed from 7/13   Anthropometrics:   Height: 64 inches Weight: 73 lb  UBW: 110-120 lb maybe 2 years ago per patient  88 lb on 08/27/22 BMI: 12  17% weight loss in the last year, concerning   Estimated Energy Needs  Kcals: 1000-1200 Protein: 40-50 g Fluid: 1000-1200 ml   NUTRITION DIAGNOSIS: Inadequate oral intake related to cancer, HIV (?viral load), dysphagia as evidenced by 17% weight loss in the last year, severe fat loss, severe muscle mass  loss.   MALNUTRITION DIAGNOSIS: Patient meets criteria for severe malnutrition in context of chronic illness as evidenced by severe fat loss, severe muscle mass loss   INTERVENTION:  Unsure treatment plan at this time. If aggressive treatment is desired, would consider feeding tube placement Discussed grinding, pureeing foods in blender/nutrabullet for ease of swallowing Provided samples of ensure complete and boost [facility] for patient to try.  Discussed ways to thin them down.   Will contact [facility] as patient thinks they were sending her ensure original shakes Contact information provided   MONITORING, EVALUATION, GOAL: weight trends, intake   Next Visit: to be determined  Darrin Apodaca B. Dasie SOLON, CSO, LDN Registered Dietitian [phone]

## 2023-09-06 NOTE — Progress Notes (Signed)
 Met with patient and her son during initial consult with Dr. Jacobo. All questions answered during visit. Reviewed upcoming appts with patient. Contact info given and instructed to call with any questions or needs. Pt and son verbalized understanding.

## 2023-09-06 NOTE — Progress Notes (Signed)
 [facility]  [phone] [phone]  ID: Whitney [name] OB: 29-Dec-1970  MR#: [id number]  RDW#:[id number]  Patient Care Team: Ernie Yancy Roof, MD as PCP - General (Family Medicine) Verdene Gills, RN as Oncology Nurse Navigator Jacobo, Evalene PARAS, MD as Consulting Physician (Oncology)  CHIEF COMPLAINT: Laryngeal mass and lung mass.  INTERVAL HISTORY: Patient is a 53 year old HIV-positive woman who recently presented to the emergency room with increasing shortness of breath.  She also reports a significant amount of weight loss greater than 30 pounds as well as difficulty swallowing recently.  She has no neurologic complaints.  She denies any fevers.  She has a poor appetite.  She has no neck pain, but has increased hoarseness of voice.  She has no chest pain, cough, or hemoptysis.  She denies any nausea, vomiting, constipation, or diarrhea.  She has no urinary complaints.  Patient offers no further specific complaints today.  REVIEW OF SYSTEMS:   Review of Systems  Constitutional:  Positive for malaise/fatigue and weight loss. Negative for fever.  Respiratory:  Positive for shortness of breath. Negative for cough and hemoptysis.   Cardiovascular: Negative.  Negative for chest pain and leg swelling.  Gastrointestinal: Negative.  Negative for abdominal pain and nausea.  Genitourinary: Negative.  Negative for dysuria.  Musculoskeletal:  Positive for back pain.  Skin: Negative.  Negative for rash.  Neurological:  Positive for weakness. Negative for dizziness, focal weakness and headaches.  Psychiatric/Behavioral: Negative.  The patient is not nervous/anxious.     As per HPI. Otherwise, a complete review of systems is negative.  PAST MEDICAL HISTORY: Past Medical History:  Diagnosis Date   Anxiety    COPD (chronic obstructive pulmonary disease) (HCC)    GERD (gastroesophageal reflux disease)    HIV disease (HCC)    Hypertension    PTSD  (post-traumatic stress disorder)    Stroke (HCC)     PAST SURGICAL HISTORY: Past Surgical History:  Procedure Laterality Date   CESAREAN SECTION     ESOPHAGOGASTRODUODENOSCOPY (EGD) WITH PROPOFOL  N/A 12/13/2022   Procedure: ESOPHAGOGASTRODUODENOSCOPY (EGD) WITH PROPOFOL ;  Surgeon: Therisa Bi, MD;  Location: [facility] ENDOSCOPY;  Service: Gastroenterology;  Laterality: N/A;   TUBAL LIGATION      FAMILY HISTORY: Family History  Problem Relation Age of Onset   Parkinson's disease Father    CAD Father    Hypertension Mother     ADVANCED DIRECTIVES (Y/N):  N  HEALTH MAINTENANCE: Social History   Tobacco Use   Smoking status: Every Day    Current packs/day: 0.25    Average packs/day: 0.3 packs/day for 36.0 years (9.0 ttl pk-yrs)    Types: Cigarettes   Smokeless tobacco: Never  Vaping Use   Vaping status: Never Used  Substance Use Topics   Alcohol use: Yes    Comment: ocassionally   Drug use: Yes    Types: Marijuana    Comment: daily occ     Colonoscopy:  PAP:  Bone density:  Lipid panel:  Allergies  Allergen Reactions   Penicillins Hives   Aspirin Rash and Other (See Comments)    Reaction:  GI upset     Current Outpatient Medications  Medication Sig Dispense Refill   abacavir -dolutegravir -lamiVUDine  (TRIUMEQ) 600-50-300 MG tablet Take 1 tablet by mouth daily.     acetaminophen  (TYLENOL  CHILDRENS) 160 MG/5ML suspension Take 15.6 mLs (500 mg total) by mouth every 6 (six) hours as needed. 236 mL 0   albuterol  (ACCUNEB ) 1.25 MG/3ML nebulizer solution Take  3 mLs (1.25 mg total) by nebulization every 6 (six) hours as needed for wheezing. 75 mL 12   atorvastatin (LIPITOR) 80 MG tablet Take 1 tablet by mouth daily.     bictegravir-emtricitabine-tenofovir AF (BIKTARVY) 50-200-25 MG TABS tablet Take by mouth daily.     diphenhydrAMINE  (BENADRYL ) 25 MG tablet Take 25 mg by mouth every 6 (six) hours as needed for allergies.     DULoxetine  (CYMBALTA ) 30 MG capsule Take 60 mg by  mouth daily.     fluticasone (FLONASE) 50 MCG/ACT nasal spray Place 2 sprays into both nostrils daily as needed.     Fluticasone-Salmeterol (ADVAIR) 250-50 MCG/DOSE AEPB Inhale 1 puff into the lungs 2 (two) times daily.     Ipratropium-Albuterol  (COMBIVENT IN) Four (4) times a day. Frequency:QID   Dosage:0.0     Instructions:  Note:Dose: 18-103MCG     lidocaine  (XYLOCAINE ) 2 % solution Use as directed 15 mLs in the mouth or throat as needed for mouth pain. 100 mL 0   Loratadine 10 MG CHEW Chew 1 tablet by mouth daily as needed.     amLODipine (NORVASC) 10 MG tablet Take 10 mg by mouth daily. (Patient not taking: Reported on 09/06/2023)     azithromycin  (ZITHROMAX  Z-PAK) 250 MG tablet Take 2 tablets (500 mg) on  Day 1,  followed by 1 tablet (250 mg) once daily on Days 2 through 5. (Patient not taking: Reported on 09/06/2023) 6 each 0   ezetimibe (ZETIA) 10 MG tablet Take 10 mg by mouth daily. (Patient not taking: Reported on 09/06/2023)     famotidine  (PEPCID ) 20 MG tablet Take 1 tablet (20 mg total) by mouth daily. (Patient not taking: Reported on 09/06/2023) 30 tablet 0   feeding supplement, ENSURE ENLIVE, (ENSURE ENLIVE) LIQD Take 237 mLs by mouth 2 (two) times daily between meals. (Patient not taking: Reported on 09/06/2023) 237 mL 12   ferrous sulfate  325 (65 FE) MG tablet Take 1 tablet (325 mg total) by mouth 2 (two) times daily with a meal. (Patient not taking: Reported on 09/06/2023) 60 tablet 0   gabapentin (NEURONTIN) 300 MG capsule Take 1 capsule by mouth in the morning, at noon, and at bedtime. (Patient not taking: Reported on 09/06/2023)     levofloxacin  (LEVAQUIN ) 750 MG tablet Take 1 tablet (750 mg total) by mouth daily. (Patient not taking: Reported on 09/06/2023) 6 tablet 0   losartan (COZAAR) 50 MG tablet Take 50 mg by mouth daily. (Patient not taking: Reported on 09/06/2023)     methylPREDNISolone  (MEDROL  DOSEPAK) 4 MG TBPK tablet follow package directions (Patient not taking: Reported on  09/06/2023) 21 tablet 0   montelukast (SINGULAIR) 10 MG tablet Take 10 mg by mouth daily. (Patient not taking: Reported on 09/06/2023)     Morphine Sulfate (MORPHINE CONCENTRATE) 10 mg / 0.5 ml concentrated solution Take 0.25 mLs (5 mg total) by mouth every 6 (six) hours as needed. 30 mL 0   naloxone (NARCAN) nasal spray 4 mg/0.1 mL SPRAY 1 SPRAY INTO ONE NOSTRIL AS DIRECTED FOR OPIOID OVERDOSE (TURN PERSON ON SIDE AFTER DOSE. IF NO RESPONSE IN 2-3 MINUTES OR PERSON RESPONDS BUT RELAPSES, REPEAT USING A NEW SPRAY DEVICE AND SPRAY INTO THE OTHER NOSTRIL. CALL 911 AFTER USE.) * EMERGENCY USE ONLY * 1 each 0   norethindrone (AYGESTIN) 5 MG tablet Take by mouth. (Patient not taking: Reported on 09/06/2023)     pantoprazole  (PROTONIX ) 40 MG tablet Take 1 tablet (40 mg total) by mouth 2 (  two) times daily. (Patient not taking: Reported on 09/06/2023) 90 tablet 1   tiotropium (SPIRIVA ) 18 MCG inhalation capsule Place 18 mcg into inhaler and inhale daily. (Patient not taking: Reported on 09/06/2023)     triamcinolone ointment (KENALOG) 0.1 % Apply topically. (Patient not taking: Reported on 09/06/2023)     No current facility-administered medications for this visit.    OBJECTIVE: Vitals:   09/06/23 1143  BP: (!) 150/107  Pulse: 100  Resp: 16  Temp: (!) 97.3 F (36.3 C)  SpO2: 100%     Body mass index is 12.53 kg/m.    ECOG FS:1 - Symptomatic but completely ambulatory  General: Cachectic, no acute distress.  Sitting in wheelchair. Eyes: Pink conjunctiva, anicteric sclera. HEENT: Normocephalic, moist mucous membranes. Lungs: No audible wheezing or coughing. Heart: Regular rate and rhythm. Abdomen: Soft, nontender, no obvious distention. Musculoskeletal: No edema, cyanosis, or clubbing. Neuro: Alert, answering all questions appropriately. Cranial nerves grossly intact. Skin: No rashes or petechiae noted. Psych: Normal affect.  LAB RESULTS:  Lab Results  Component Value Date   NA 140 09/01/2023    K 3.1 (L) 09/01/2023   CL 99 09/01/2023   CO2 31 09/01/2023   GLUCOSE 135 (H) 09/01/2023   BUN 8 09/01/2023   CREATININE 0.61 09/01/2023   CALCIUM 9.5 09/01/2023   PROT 6.9 09/01/2023   ALBUMIN 3.3 (L) 09/01/2023   AST 22 09/01/2023   ALT 15 09/01/2023   ALKPHOS 81 09/01/2023   BILITOT 0.8 09/01/2023   GFRNONAA >60 09/01/2023   GFRAA >60 01/30/2016    Lab Results  Component Value Date   WBC 8.4 09/01/2023   NEUTROABS 6.9 09/01/2023   HGB 12.8 09/01/2023   HCT 42.4 09/01/2023   MCV 86.0 09/01/2023   PLT 253 09/01/2023     STUDIES: NM PET Image Initial (PI) Skull Base To Thigh Result Date: 09/04/2023 CLINICAL DATA:  Initial treatment strategy for neck mass and lung mass. EXAM: NUCLEAR MEDICINE PET SKULL BASE TO THIGH TECHNIQUE: 5.95 mCi F-18 FDG was injected intravenously. Full-ring PET imaging was performed from the skull base to thigh after the radiotracer. CT data was obtained and used for attenuation correction and anatomic localization. Fasting blood glucose: 121 mg/dl COMPARISON:  CT neck and chest from 09/01/2023 FINDINGS: Mediastinal blood pool activity: SUV max 2.3 Liver activity: SUV max NA NECK: Corresponding to the recent CT findings there is a tracer avid tumor extending from the vallecula to the vocal cords and involving the right and left supraglottic larynx and area epigastric folds. This measures approximately 3.2 x 3.7 by 4.6 cm, axial image 22. 1.6 cm right level 3 lymph node has an SUV max of 9.3, axial image 23. Incidental CT findings: None. CHEST: Perihilar left upper lobe lung mass measures 4.5 by 3.9 by 7.6 cm and has an SUV max of 15.3. No signs of postobstructive atelectasis or consolidation. Nodule within the inferior lingula is new from 09/01/2023 and is considered nonspecific measuring 1.1 cm with SUV max of 4.3, axial image 72. Tracer avid low right paratracheal lymph node measures 1.4 cm with SUV max of 8.7, axial image 47. Left paratracheal lymph node  measures 8 mm with SUV max of 8.4, axial image 47. Incidental CT findings: Emphysema with diffuse bronchial wall thickening. Aortic atherosclerosis and coronary artery calcifications. No significant pericardial or pleural effusion. ABDOMEN/PELVIS: No abnormal hypermetabolic activity within the liver, pancreas, adrenal glands, or spleen. No hypermetabolic lymph nodes in the abdomen or pelvis. Incidental CT findings:  None. SKELETON: No focal hypermetabolic activity to suggest skeletal metastasis. Incidental CT findings: None. IMPRESSION: 1. Corresponding to the recent CT findings there is a tracer avid tumor extending from the vallecula to the vocal cords and involving the right and left supraglottic larynx and area epigastric folds. 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 2. Tracer avid right level 3 lymph node is compatible with nodal metastasis. 3. Perihilar left upper lobe lung mass is tracer avid and compatible with primary lung carcinoma. 4. Tracer avid bilateral paratracheal lymph nodes are compatible with nodal metastasis. 5. Nodule within the inferior lingula is new from 09/01/2023 and is considered nonspecific measuring 1.1 cm with SUV max of 4.3. This is considered nonspecific and may be inflammatory or neoplastic. 6. Coronary artery calcifications. 7. Aortic Atherosclerosis (ICD10-I70.0) and Emphysema (ICD10-J43.9). Electronically Signed   By: Waddell Calk M.D.   On: 09/04/2023 10:34   CT Soft Tissue Neck W Contrast Result Date: 09/01/2023 EXAM: CT NECK WITH CONTRAST 09/01/2023 08:59:37 AM TECHNIQUE: CT of the neck was performed with the administration of 75 mL of intravenous iohexol  (OMNIPAQUE ) 300 mg/mL solution. Multiplanar reformatted images are provided for review. Automated exposure control, iterative reconstruction, and/or weight based adjustment of the mA/kV was utilized to reduce the radiation dose to as low as reasonably achievable. COMPARISON: None available. CLINICAL  HISTORY: Cough, shortness of breath, difficulty swallowing, sore throat. History of HIV. Abnormal chest x-ray, eval for malignancy or other pathology. FINDINGS: AERODIGESTIVE TRACT: Heterogeneously enhancing tumor mass measures 4.0X3.1X3.7 Cm, extending from the vallecula to the vocal cords and asymmetric to the right. The lesion involves the right and left supraglottic larynx and aryepiglottic folds. The supraglottic airway is narrowed to less than 3 mm. SALIVARY GLANDS: The parotid and submandibular glands are unremarkable. THYROID: Unremarkable. LYMPH NODES: A heterogeneously enhancing right level 3 lymph node measures 2.1 x 1.4 x 2.2 cm. Other homogeneously enhancing right level 2 and level 3 lymph nodes are present. A 9 mm paratracheal lymph node is present. SOFT TISSUES: No mass or fluid collection. BRAIN, ORBITS, SINUSES AND MASTOIDS: No acute abnormality. LUNGS AND MEDIASTINUM: Heterogeneously enhancing mass lesion measures 3.9 x 3.1 cm. Severe central lobular emphysematous changes are present in both lungs. BONES: Mild degenerative changes are present in the cervical spine. No focal osseous lesions are present. The patient is edentulous. IMPRESSION: 1. Heterogeneously enhancing tumor mass (4.0 x 3.1 x 3.7 cm) extending from the vallecula to the vocal cords, involving the right and left supraglottic larynx and aryepiglottic folds, causing supraglottic airway narrowing to less than 3 mm. 2. Heterogeneously enhancing right level 3 lymph node (2.1 x 1.4 x 2.2 cm) . her homogenously enhancing nodes are present, consistent with metastatic disease to the right neck. 3. Heterogeneously enhancing mass lesion in the mediastinum (3.9 x 3.1 cm) may represent metastatic disease to the chest versus a synchronous primary tumor. . 4. Severe central lobular emphysematous changes in both lungs. Electronically signed by: Lonni Necessary MD 09/01/2023 09:13 AM EDT RP Workstation: [id number]   CT Chest W Contrast Result  Date: 09/01/2023 CLINICAL DATA:  Cough, shortness of breath, difficulty swallowing, sore throat, history of HIV, abnormal chest x-ray, concern for malignancy * Tracking Code: BO * EXAM: CT CHEST WITH CONTRAST TECHNIQUE: Multidetector CT imaging of the chest was performed during intravenous contrast administration. RADIATION DOSE REDUCTION: This exam was performed according to the departmental dose-optimization program which includes automated exposure control, adjustment of the mA and/or kV according to patient size and/or use of  iterative reconstruction technique. CONTRAST:  75mL OMNIPAQUE  IOHEXOL  300 MG/ML  SOLN COMPARISON:  Same day chest radiograph FINDINGS: Cardiovascular: Aortic atherosclerosis. Normal heart size. Scattered left coronary artery calcifications. No pericardial effusion. Mediastinum/Nodes: Enlarged pretracheal, AP window, and left hilar lymph nodes, left hilar nodes measuring up to 2.0 x 1.4 cm (series 3, image 75). Thyroid gland, trachea, and esophagus demonstrate no significant findings. Lungs/Pleura: Severe, bullous emphysema. Suprahilar mass of the left upper lobe measuring 3.9 x 3.8 cm (series 5, image 73). No pleural effusion or pneumothorax. Upper Abdomen: No acute abnormality. Musculoskeletal: No chest wall abnormality. No acute osseous findings. IMPRESSION: 1. Suprahilar mass of the left upper lobe measuring 3.9 x 3.8 cm, consistent with primary bronchogenic malignancy. 2. Enlarged mediastinal and left hilar lymph nodes, consistent with nodal metastatic disease. 3. Severe, bullous emphysema. 4. Coronary artery disease. Aortic Atherosclerosis (ICD10-I70.0) and Emphysema (ICD10-J43.9). Electronically Signed   By: Marolyn JONETTA Jaksch M.D.   On: 09/01/2023 09:08   DG Chest Portable 1 View Result Date: 09/01/2023 EXAM: 1 VIEW XRAY OF THE CHEST 09/01/2023 07:42:00 AM COMPARISON: None available. CLINICAL HISTORY: Short of breath. Pt here with SOB for weeks. Pt states she went to her primary and  they gave her abx. Pt has a cough but denies a fever. Pt has a hx of COPD. Pt had an episode of vomiting this morning. FINDINGS: LUNGS AND PLEURA: Changes of COPD are noted. A left suprahilar mass is new. The lungs are otherwise clear. No pulmonary edema. No pleural effusion. No pneumothorax. HEART AND MEDIASTINUM: No acute abnormality of the cardiac and mediastinal silhouettes. BONES AND SOFT TISSUES: No acute osseous abnormality. IMPRESSION: 1. New left suprahilar mass. Recommend CT of the chest with contrast for further evaluation. 2. Changes of COPD. Findings were discussed with Dr. Clarine at 07:50 am. Electronically signed by: Lonni Necessary MD 09/01/2023 07:52 AM EDT RP Workstation: [id number]    ASSESSMENT: Laryngeal mass and lung mass.  PLAN:    Laryngeal mass and lung mass: Highly suspicious for underlying malignancy.  Appears to be 2 separate primaries, but will require biopsy of each mass to confirm.  PET scan results from September 01, 2023 reviewed independently and reported as above with no other obvious metastatic disease.  Has a referral to ENT and pulmonary for evaluation and possible biopsy.  Patient will also require MRI of the brain to complete the staging workup.  Return to clinic after her 2 biopsies to discuss the results and treatment planning.  Appreciate palliative care input. Weight loss: Likely secondary to underlying malignancy and difficulty eating, but it is also unclear what her viral load of HIV is.  She could have cachexia secondary to this.  Patient had consultation with dietary today.   HIV: Unclear when patient was last evaluated although she reports that she has been compliant with her medications until recently.  HIV viral load is pending at time of dictation.   Pain: Patient cannot swallow pills and fentanyl patch is likely will not do much good given her lack of body fat.  Patient was prescribed liquid morphine.  Monitor.  Appreciate palliative care input.  I spent  a total of 60 minutes reviewing chart data, face-to-face evaluation with the patient, counseling and coordination of care as detailed above.   Patient expressed understanding and was in agreement with this plan. She also understands that She can call clinic at any time with any questions, concerns, or complaints.    Cancer Staging  No matching staging information  was found for the patient.   Evalene JINNY Reusing, MD   09/06/2023 3:07 PM

## 2023-09-06 NOTE — Progress Notes (Signed)
 Urgent referral faxed to [facility] at [phone].

## 2023-09-06 NOTE — Progress Notes (Signed)
 Palliative Medicine [facility] at [facility] [phone] [phone]   Name: Whitney [name] Date: 09/06/2023 MRN: [id number]  DOB: Jun 30, 1970  Patient Care Team: Ernie Yancy Roof, MD as PCP - General (Family Medicine) Verdene Gills, RN as Oncology Nurse Navigator Jacobo, Evalene PARAS, MD as Consulting Physician (Oncology)    REASON FOR CONSULTATION: Whitney [name] is a 53 y.o. female with multiple medical problems including COPD, HIV on treatment, history of prior stroke.  PET scan revealed left upper lobe lung mass with bilateral peritracheal lymph nodal metastasis and findings suggestive of primary laryngeal carcinoma.  Palliative care was consulted to address goals and manage ongoing symptoms.  SOCIAL HISTORY:     reports that she has been smoking cigarettes. She has a 9 pack-year smoking history. She has never used smokeless tobacco. She reports current alcohol use. She reports current drug use. Drug: Marijuana.  Patient lives at home with her son.  She also has a daughter who lives nearby.  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  ADVANCE DIRECTIVES:  Does not have  CODE STATUS:   PAST MEDICAL HISTORY: Past Medical History:  Diagnosis Date   Anxiety    COPD (chronic obstructive pulmonary disease) (HCC)    GERD (gastroesophageal reflux disease)    HIV disease (HCC)    Hypertension    PTSD (post-traumatic stress disorder)    Stroke (HCC)     PAST SURGICAL HISTORY:  Past Surgical History:  Procedure Laterality Date   CESAREAN SECTION     ESOPHAGOGASTRODUODENOSCOPY (EGD) WITH PROPOFOL  N/A 12/13/2022   Procedure: ESOPHAGOGASTRODUODENOSCOPY (EGD) WITH PROPOFOL ;  Surgeon: Therisa Bi, MD;  Location: [facility] ENDOSCOPY;  Service: Gastroenterology;  Laterality: N/A;   TUBAL LIGATION      HEMATOLOGY/ONCOLOGY HISTORY:  Oncology History   No history exists.    ALLERGIES:  is  allergic to penicillins and aspirin.  MEDICATIONS:  Current Outpatient Medications  Medication Sig Dispense Refill   abacavir -dolutegravir -lamiVUDine  (TRIUMEQ) 600-50-300 MG tablet Take 1 tablet by mouth daily.     acetaminophen  (TYLENOL  CHILDRENS) 160 MG/5ML suspension Take 15.6 mLs (500 mg total) by mouth every 6 (six) hours as needed. 236 mL 0   albuterol  (ACCUNEB ) 1.25 MG/3ML nebulizer solution Take 3 mLs (1.25 mg total) by nebulization every 6 (six) hours as needed for wheezing. 75 mL 12   amLODipine (NORVASC) 10 MG tablet Take 10 mg by mouth daily. (Patient not taking: Reported on 09/06/2023)     atorvastatin (LIPITOR) 80 MG tablet Take 1 tablet by mouth daily.     azithromycin  (ZITHROMAX  Z-PAK) 250 MG tablet Take 2 tablets (500 mg) on  Day 1,  followed by 1 tablet (250 mg) once daily on Days 2 through 5. (Patient not taking: Reported on 09/06/2023) 6 each 0   bictegravir-emtricitabine-tenofovir AF (BIKTARVY) 50-200-25 MG TABS tablet Take by mouth daily.     diphenhydrAMINE  (BENADRYL ) 25 MG tablet Take 25 mg by mouth every 6 (six) hours as needed for allergies.     DULoxetine  (CYMBALTA ) 30 MG capsule Take 60 mg by mouth daily.     ezetimibe (ZETIA) 10 MG tablet Take 10 mg by mouth daily. (Patient not taking: Reported on 09/06/2023)     famotidine  (PEPCID ) 20 MG tablet Take 1 tablet (20 mg total) by mouth daily. (Patient not taking: Reported on 09/06/2023) 30 tablet 0   feeding supplement, ENSURE ENLIVE, (ENSURE ENLIVE) LIQD Take 237 mLs by mouth 2 (  two) times daily between meals. (Patient not taking: Reported on 09/06/2023) 237 mL 12   ferrous sulfate  325 (65 FE) MG tablet Take 1 tablet (325 mg total) by mouth 2 (two) times daily with a meal. (Patient not taking: Reported on 09/06/2023) 60 tablet 0   fluticasone (FLONASE) 50 MCG/ACT nasal spray Place 2 sprays into both nostrils daily as needed.     Fluticasone-Salmeterol (ADVAIR) 250-50 MCG/DOSE AEPB Inhale 1 puff into the lungs 2 (two) times  daily.     gabapentin (NEURONTIN) 300 MG capsule Take 1 capsule by mouth in the morning, at noon, and at bedtime. (Patient not taking: Reported on 09/06/2023)     Ipratropium-Albuterol  (COMBIVENT IN) Four (4) times a day. Frequency:QID   Dosage:0.0     Instructions:  Note:Dose: 18-103MCG     levofloxacin  (LEVAQUIN ) 750 MG tablet Take 1 tablet (750 mg total) by mouth daily. (Patient not taking: Reported on 09/06/2023) 6 tablet 0   lidocaine  (XYLOCAINE ) 2 % solution Use as directed 15 mLs in the mouth or throat as needed for mouth pain. 100 mL 0   Loratadine 10 MG CHEW Chew 1 tablet by mouth daily as needed.     losartan (COZAAR) 50 MG tablet Take 50 mg by mouth daily. (Patient not taking: Reported on 09/06/2023)     methylPREDNISolone  (MEDROL  DOSEPAK) 4 MG TBPK tablet follow package directions (Patient not taking: Reported on 09/06/2023) 21 tablet 0   montelukast (SINGULAIR) 10 MG tablet Take 10 mg by mouth daily. (Patient not taking: Reported on 09/06/2023)     norethindrone (AYGESTIN) 5 MG tablet Take by mouth. (Patient not taking: Reported on 09/06/2023)     pantoprazole  (PROTONIX ) 40 MG tablet Take 1 tablet (40 mg total) by mouth 2 (two) times daily. (Patient not taking: Reported on 09/06/2023) 90 tablet 1   tiotropium (SPIRIVA ) 18 MCG inhalation capsule Place 18 mcg into inhaler and inhale daily. (Patient not taking: Reported on 09/06/2023)     triamcinolone ointment (KENALOG) 0.1 % Apply topically. (Patient not taking: Reported on 09/06/2023)     No current facility-administered medications for this visit.    VITAL SIGNS: LMP 08/29/2014  There were no vitals filed for this visit.  Estimated body mass index is 12.53 kg/m as calculated from the following:   Height as of 12/13/22: 5' 4 (1.626 m).   Weight as of an earlier encounter on 09/06/23: 73 lb (33.1 kg).  LABS: CBC:    Component Value Date/Time   WBC 8.4 09/01/2023 0743   HGB 12.8 09/01/2023 0743   HCT 42.4 09/01/2023 0743   PLT 253  09/01/2023 0743   MCV 86.0 09/01/2023 0743   NEUTROABS 6.9 09/01/2023 0743   LYMPHSABS 1.1 09/01/2023 0743   MONOABS 0.4 09/01/2023 0743   EOSABS 0.0 09/01/2023 0743   BASOSABS 0.0 09/01/2023 0743   Comprehensive Metabolic Panel:    Component Value Date/Time   NA 140 09/01/2023 0743   K 3.1 (L) 09/01/2023 0743   CL 99 09/01/2023 0743   CO2 31 09/01/2023 0743   BUN 8 09/01/2023 0743   CREATININE 0.61 09/01/2023 0743   GLUCOSE 135 (H) 09/01/2023 0743   CALCIUM 9.5 09/01/2023 0743   AST 22 09/01/2023 0743   ALT 15 09/01/2023 0743   ALKPHOS 81 09/01/2023 0743   BILITOT 0.8 09/01/2023 0743   PROT 6.9 09/01/2023 0743   ALBUMIN 3.3 (L) 09/01/2023 0743    RADIOGRAPHIC STUDIES: NM PET Image Initial (PI) Skull Base To Thigh Result Date:  09/04/2023 CLINICAL DATA:  Initial treatment strategy for neck mass and lung mass. EXAM: NUCLEAR MEDICINE PET SKULL BASE TO THIGH TECHNIQUE: 5.95 mCi F-18 FDG was injected intravenously. Full-ring PET imaging was performed from the skull base to thigh after the radiotracer. CT data was obtained and used for attenuation correction and anatomic localization. Fasting blood glucose: 121 mg/dl COMPARISON:  CT neck and chest from 09/01/2023 FINDINGS: Mediastinal blood pool activity: SUV max 2.3 Liver activity: SUV max NA NECK: Corresponding to the recent CT findings there is a tracer avid tumor extending from the vallecula to the vocal cords and involving the right and left supraglottic larynx and area epigastric folds. This measures approximately 3.2 x 3.7 by 4.6 cm, axial image 22. 1.6 cm right level 3 lymph node has an SUV max of 9.3, axial image 23. Incidental CT findings: None. CHEST: Perihilar left upper lobe lung mass measures 4.5 by 3.9 by 7.6 cm and has an SUV max of 15.3. No signs of postobstructive atelectasis or consolidation. Nodule within the inferior lingula is new from 09/01/2023 and is considered nonspecific measuring 1.1 cm with SUV max of 4.3, axial  image 72. Tracer avid low right paratracheal lymph node measures 1.4 cm with SUV max of 8.7, axial image 47. Left paratracheal lymph node measures 8 mm with SUV max of 8.4, axial image 47. Incidental CT findings: Emphysema with diffuse bronchial wall thickening. Aortic atherosclerosis and coronary artery calcifications. No significant pericardial or pleural effusion. ABDOMEN/PELVIS: No abnormal hypermetabolic activity within the liver, pancreas, adrenal glands, or spleen. No hypermetabolic lymph nodes in the abdomen or pelvis. Incidental CT findings: None. SKELETON: No focal hypermetabolic activity to suggest skeletal metastasis. Incidental CT findings: None. IMPRESSION: 1. Corresponding to the recent CT findings there is a tracer avid tumor extending from the vallecula to the vocal cords and involving the right and left supraglottic larynx and area epigastric folds. 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 2. Tracer avid right level 3 lymph node is compatible with nodal metastasis. 3. Perihilar left upper lobe lung mass is tracer avid and compatible with primary lung carcinoma. 4. Tracer avid bilateral paratracheal lymph nodes are compatible with nodal metastasis. 5. Nodule within the inferior lingula is new from 09/01/2023 and is considered nonspecific measuring 1.1 cm with SUV max of 4.3. This is considered nonspecific and may be inflammatory or neoplastic. 6. Coronary artery calcifications. 7. Aortic Atherosclerosis (ICD10-I70.0) and Emphysema (ICD10-J43.9). Electronically Signed   By: Waddell Calk M.D.   On: 09/04/2023 10:34   CT Soft Tissue Neck W Contrast Result Date: 09/01/2023 EXAM: CT NECK WITH CONTRAST 09/01/2023 08:59:37 AM TECHNIQUE: CT of the neck was performed with the administration of 75 mL of intravenous iohexol  (OMNIPAQUE ) 300 mg/mL solution. Multiplanar reformatted images are provided for review. Automated exposure control, iterative reconstruction, and/or weight based  adjustment of the mA/kV was utilized to reduce the radiation dose to as low as reasonably achievable. COMPARISON: None available. CLINICAL HISTORY: Cough, shortness of breath, difficulty swallowing, sore throat. History of HIV. Abnormal chest x-ray, eval for malignancy or other pathology. FINDINGS: AERODIGESTIVE TRACT: Heterogeneously enhancing tumor mass measures 4.0X3.1X3.7 Cm, extending from the vallecula to the vocal cords and asymmetric to the right. The lesion involves the right and left supraglottic larynx and aryepiglottic folds. The supraglottic airway is narrowed to less than 3 mm. SALIVARY GLANDS: The parotid and submandibular glands are unremarkable. THYROID: Unremarkable. LYMPH NODES: A heterogeneously enhancing right level 3 lymph node measures 2.1 x 1.4 x 2.2  cm. Other homogeneously enhancing right level 2 and level 3 lymph nodes are present. A 9 mm paratracheal lymph node is present. SOFT TISSUES: No mass or fluid collection. BRAIN, ORBITS, SINUSES AND MASTOIDS: No acute abnormality. LUNGS AND MEDIASTINUM: Heterogeneously enhancing mass lesion measures 3.9 x 3.1 cm. Severe central lobular emphysematous changes are present in both lungs. BONES: Mild degenerative changes are present in the cervical spine. No focal osseous lesions are present. The patient is edentulous. IMPRESSION: 1. Heterogeneously enhancing tumor mass (4.0 x 3.1 x 3.7 cm) extending from the vallecula to the vocal cords, involving the right and left supraglottic larynx and aryepiglottic folds, causing supraglottic airway narrowing to less than 3 mm. 2. Heterogeneously enhancing right level 3 lymph node (2.1 x 1.4 x 2.2 cm) . her homogenously enhancing nodes are present, consistent with metastatic disease to the right neck. 3. Heterogeneously enhancing mass lesion in the mediastinum (3.9 x 3.1 cm) may represent metastatic disease to the chest versus a synchronous primary tumor. . 4. Severe central lobular emphysematous changes in both  lungs. Electronically signed by: Lonni Necessary MD 09/01/2023 09:13 AM EDT RP Workstation: [id number]   CT Chest W Contrast Result Date: 09/01/2023 CLINICAL DATA:  Cough, shortness of breath, difficulty swallowing, sore throat, history of HIV, abnormal chest x-ray, concern for malignancy * Tracking Code: BO * EXAM: CT CHEST WITH CONTRAST TECHNIQUE: Multidetector CT imaging of the chest was performed during intravenous contrast administration. RADIATION DOSE REDUCTION: This exam was performed according to the departmental dose-optimization program which includes automated exposure control, adjustment of the mA and/or kV according to patient size and/or use of iterative reconstruction technique. CONTRAST:  75mL OMNIPAQUE  IOHEXOL  300 MG/ML  SOLN COMPARISON:  Same day chest radiograph FINDINGS: Cardiovascular: Aortic atherosclerosis. Normal heart size. Scattered left coronary artery calcifications. No pericardial effusion. Mediastinum/Nodes: Enlarged pretracheal, AP window, and left hilar lymph nodes, left hilar nodes measuring up to 2.0 x 1.4 cm (series 3, image 75). Thyroid gland, trachea, and esophagus demonstrate no significant findings. Lungs/Pleura: Severe, bullous emphysema. Suprahilar mass of the left upper lobe measuring 3.9 x 3.8 cm (series 5, image 73). No pleural effusion or pneumothorax. Upper Abdomen: No acute abnormality. Musculoskeletal: No chest wall abnormality. No acute osseous findings. IMPRESSION: 1. Suprahilar mass of the left upper lobe measuring 3.9 x 3.8 cm, consistent with primary bronchogenic malignancy. 2. Enlarged mediastinal and left hilar lymph nodes, consistent with nodal metastatic disease. 3. Severe, bullous emphysema. 4. Coronary artery disease. Aortic Atherosclerosis (ICD10-I70.0) and Emphysema (ICD10-J43.9). Electronically Signed   By: Marolyn JONETTA Jaksch M.D.   On: 09/01/2023 09:08   DG Chest Portable 1 View Result Date: 09/01/2023 EXAM: 1 VIEW XRAY OF THE CHEST 09/01/2023  07:42:00 AM COMPARISON: None available. CLINICAL HISTORY: Short of breath. Pt here with SOB for weeks. Pt states she went to her primary and they gave her abx. Pt has a cough but denies a fever. Pt has a hx of COPD. Pt had an episode of vomiting this morning. FINDINGS: LUNGS AND PLEURA: Changes of COPD are noted. A left suprahilar mass is new. The lungs are otherwise clear. No pulmonary edema. No pleural effusion. No pneumothorax. HEART AND MEDIASTINUM: No acute abnormality of the cardiac and mediastinal silhouettes. BONES AND SOFT TISSUES: No acute osseous abnormality. IMPRESSION: 1. New left suprahilar mass. Recommend CT of the chest with contrast for further evaluation. 2. Changes of COPD. Findings were discussed with Dr. Clarine at 07:50 am. Electronically signed by: Lonni Necessary MD 09/01/2023 07:52 AM EDT RP  Workstation: [id number]    PERFORMANCE STATUS (ECOG) : 2 - Symptomatic, <50% confined to bed  Review of Systems Unless otherwise noted, a complete review of systems is negative.  Physical Exam General: Thin, frail-appearing Pulmonary: Unlabored Extremities: no edema, no joint deformities Skin: no rashes Neurological: Weakness but otherwise nonfocal  IMPRESSION: Patient was an add-on to my clinic schedule today at Dr. Jerone request.  Workup suggestive of 2 primary cancers (lung and laryngeal).  Patient pending biopsies and further workup.  Patient verbalized understanding that workup is suggestive of advanced stage cancers and that prognosis could be poor.  She is interested in completing workup and discussing treatment options.  Symptomatically, she has had significant weight loss and is cachectic appearing.  She is having difficulty swallowing and constantly has to spit saliva.  Additionally, patient reports having significant odynophagia and pain in the chest and arms.  Will start her on low-dose liquid morphine.  We use concentrated form to try to reduce volume.  Will also  prescribe naloxone.  Safe administration and storage of medication discussed.  Recommend bowel regimen to prevent opioid-induced constipation.  Patient reports taking opioids in the past and denies any adverse effects.  She denies any history of addiction.  Patient says that she would want her son to be her healthcare proxy if needed.  She is interested in establishing advanced directives.  Patient sent home with ACP documents and MOST form to review.  PLAN: - Continue current scope of treatment - Start morphine concentrate 5 mg (0.25 mL) every 6 hours as needed #30 - PDMP reviewed - Daily bowel regimen - Naloxone - ACP/MOST form  Case and plan discussed with Dr. Jacobo  Patient expressed understanding and was in agreement with this plan. She also understands that She can call the clinic at any time with any questions, concerns, or complaints.     Time Total: 20 minutes  Visit consisted of counseling and education dealing with the complex and emotionally intense issues of symptom management and palliative care in the setting of serious and potentially life-threatening illness.Greater than 50%  of this time was spent counseling and coordinating care related to the above assessment and plan.  Signed by: Fonda Mower, PhD, NP-C

## 2023-09-08 LAB — HIV-1/HIV-2 QUAL RNA
HIV-1 RNA, Qualitative: REACTIVE — AB
HIV-2 RNA, Qualitative: NONREACTIVE

## 2023-09-09 ENCOUNTER — Telehealth: Payer: Self-pay | Admitting: *Deleted

## 2023-09-09 ENCOUNTER — Inpatient Hospital Stay

## 2023-09-09 ENCOUNTER — Encounter: Payer: Self-pay | Admitting: *Deleted

## 2023-09-09 NOTE — Telephone Encounter (Signed)
 Entered in error

## 2023-09-09 NOTE — Telephone Encounter (Signed)
 Patient called and said that she was supposed to get checked for oxygen in her house if she qualified.  The patient said that the staff never did the testing so she now needs to come back in to determine if she qualifies for oxygen in her home.  Also says that she has been doubling the morphine  when it is back pain

## 2023-09-09 NOTE — Progress Notes (Signed)
 [facility] Clinical Social Work  Clinical Social Work was referred by medical provider for need for community resources.  Clinical Social Worker contacted patient by phone to offer support and assess for needs.  Patient has a son and daughter and four grandchildren.  She feels very supported by all of them.  Her daughter-in-law will assist with transportation.     Interventions: Provided patient with information about available resources once she begins treatment.  She is on a fixed income and is concerned about medical bills.  Mailed her the [employer] and CSW contact information.       Follow Up Plan:  CSW will follow-up with patient by phone     Macario CHRISTELLA Au, LCSW  Clinical Social Worker [facility]        Patient is participating in a Managed Medicaid Plan:  Yes

## 2023-09-09 NOTE — Progress Notes (Signed)
 Pt came into the clinic for unscheduled visit asking if nursing can check her oxygen levels today. She states that she feels like she needs oxygen. Oxygen checked with the following readings:  RA at rest - 100% RA with exertion - 84% 2L at rest - 100% 2L with exertion - 76% Oxygen increased to 6L with exertion - 76%  Pt did not appear to have any respiratory distress during visit. Informed Tinnie Dawn, NP about findings and unable to maintain safe oxygen levels. Lauren recommended for pt to proceed to ED for further evaluation. Informed pt of recommendations and pt refused to go to the ED and walked out of the clinic. Advised pt's daughter to call [phone] if notices pt in respiratory distress to proceed to the ED. Pt's daughter verbalized understanding.

## 2023-09-10 ENCOUNTER — Ambulatory Visit
Admission: RE | Admit: 2023-09-10 | Discharge: 2023-09-10 | Disposition: A | Discharge status: Home / Self Care | Source: Ambulatory Visit | Attending: Oncology | Admitting: Oncology

## 2023-09-10 ENCOUNTER — Encounter: Payer: Self-pay | Admitting: *Deleted

## 2023-09-10 DIAGNOSIS — R591 Generalized enlarged lymph nodes: Secondary | ICD-10-CM

## 2023-09-10 DIAGNOSIS — R918 Other nonspecific abnormal finding of lung field: Secondary | ICD-10-CM

## 2023-09-10 DIAGNOSIS — R221 Localized swelling, mass and lump, neck: Secondary | ICD-10-CM

## 2023-09-10 NOTE — Progress Notes (Signed)
 Per secure chat with Dr. Rennie and Dr. Blair, pt did not show for ENT appt on 7/21. Dr. Blair recommends proceeding with US  guided core biopsy of the right cervical node. Order placed and pt will be called with appt once scheduled.

## 2023-09-10 NOTE — Progress Notes (Signed)
 Whitney [name], MD sent to Whitney [name] PROCEDURE / BIOPSY REVIEW Date: 09/10/23  Requested Biopsy site: right neck Reason for request: mass Imaging review: Best seen on CT neck  Decision: Approved Imaging modality to perform: Ultrasound Schedule with: No sedation / Local anesthetic Schedule for: Any VIR  Additional comments: @Schedulers .  Please contact me with questions, concerns, or if issue pertaining to this request arise.  [name] Jenna, MD [facility]

## 2023-09-11 ENCOUNTER — Encounter: Payer: Self-pay | Admitting: Pulmonary Disease

## 2023-09-11 ENCOUNTER — Encounter: Payer: Self-pay | Admitting: *Deleted

## 2023-09-11 ENCOUNTER — Ambulatory Visit (INDEPENDENT_AMBULATORY_CARE_PROVIDER_SITE_OTHER): Admitting: Pulmonary Disease

## 2023-09-11 VITALS — BP 100/88 | HR 92 | Temp 97.1°F | Ht 64.0 in | Wt 73.0 lb

## 2023-09-11 DIAGNOSIS — R918 Other nonspecific abnormal finding of lung field: Secondary | ICD-10-CM | POA: Diagnosis not present

## 2023-09-11 DIAGNOSIS — Z87891 Personal history of nicotine dependence: Secondary | ICD-10-CM

## 2023-09-11 NOTE — Progress Notes (Signed)
 Synopsis: Referred in by Whitney Evalene PARAS, MD   Subjective:   PATIENT ID: Whitney [name]: female DOB: Nov 02, 1970, MRN: [id number]  Chief Complaint  Patient presents with   Consult    Nodule- CT 7/13. DOE. Wheezing. Cough with clear/yellow sputum.    HPI Whitney [name] is a 53 year old female patient with a past medical history of heavy tobacco use, HIV on Triumeq presenting today to the pulmonary clinic for the evaluation of a left upper lobe mass.   She presented to the ED on 07/13 with shortness of breath, cough, difficulty swallowing, weight loss and loss of appetite.   As part of the work up she underwent a CT chest that showed a left upper lobe mass centrally located and extending to the left hilum with associated mediastinal lymphadenopathy. 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 Furthermore, she does have she does have associated cervical lymphadenopathy.   The latter were redemonstrated on PET scan done on 09/04/2023 with extensive FDG activity.   ROS All systems were reviewed and are negative except for the above.  Objective:   Vitals:   09/11/23 1146  BP: 100/88  Pulse: 92  Temp: (!) 97.1 F (36.2 C)  SpO2: 93%  Weight: 73 lb (33.1 kg)  Height: 5' 4 (1.626 m)   93% on RA BMI Readings from Last 3 Encounters:  09/11/23 12.53 kg/m  09/06/23 12.53 kg/m  12/13/22 15.65 kg/m   Wt Readings from Last 3 Encounters:  09/11/23 73 lb (33.1 kg)  09/06/23 73 lb (33.1 kg)  12/13/22 91 lb 3.2 oz (41.4 kg)    Physical Exam GEN: NAD, frail and cachectic HEENT: Cervical lymphadenopathy palpable. CVS: Normal S1, Normal S2, RRR, No murmurs or ES appreciated  Lungs: Clear bilateral air entry.  Abdomen: Soft, non tender, non distended, + BS  Extremities: Warm and well perfused, No edema    Ancillary Information   CBC    Component Value Date/Time   WBC  8.4 09/01/2023 0743   RBC 4.93 09/01/2023 0743   HGB 12.8 09/01/2023 0743   HCT 42.4 09/01/2023 0743   PLT 253 09/01/2023 0743   MCV 86.0 09/01/2023 0743   MCH 26.0 09/01/2023 0743   MCHC 30.2 09/01/2023 0743   RDW 14.0 09/01/2023 0743   LYMPHSABS 1.1 09/01/2023 0743   MONOABS 0.4 09/01/2023 0743   EOSABS 0.0 09/01/2023 0743   BASOSABS 0.0 09/01/2023 0743        No data to display           Assessment & Plan:  Whitney [name] is a 53 year old female patient with a past medical history of heavy tobacco use, HIV on Triumeq presenting today to the pulmonary clinic for the evaluation of a left upper lobe mass.   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   Her left upper lobe mass could represent a synchronous primary lung carcinoma but also metastasis.   Pursuing bronchoscopy/EBUS for diagnostic purposes is certainly high risk given that her airway is already threatened from her laryngeal mass. I agree  the most appropriate next step is to obtain a cervical lymphnode biopsy and if lung mass biopsy is still deemed required than a transthoracic needle biopsy would be the next best step being mindful that she would be at significant high risk for pneumothorax.   I spent 60 minutes caring for this patient today, including preparing to see the patient, obtaining a medical history , reviewing a separately obtained history, performing a medically appropriate examination and/or evaluation, counseling and educating the patient/family/caregiver, and referring and communicating with other health care professionals (not separately  reported)  Whitney Barn, MD [facility] 09/14/2023 3:16 PM    Return if symptoms worsen or fail to improve.  I spent 60 minutes caring for this patient today, including preparing to see the patient, obtaining a medical history , reviewing a separately obtained history, performing a medically appropriate examination and/or evaluation, counseling and educating the patient/family/caregiver, documenting clinical information in the electronic health record, and independently interpreting results (not separately reported/billed) and communicating results to the patient/family/caregiver  Whitney Barn, MD [facility] 09/11/2023 1:08 PM

## 2023-09-11 NOTE — Progress Notes (Signed)
 Left message with patient regarding appt for right cervical node biopsy per Dr. Blair. Instructed pt to call back to review appt details with her. Awaiting call back.

## 2023-09-12 ENCOUNTER — Encounter: Payer: Self-pay | Admitting: *Deleted

## 2023-09-12 DIAGNOSIS — R1313 Dysphagia, pharyngeal phase: Secondary | ICD-10-CM | POA: Diagnosis not present

## 2023-09-12 DIAGNOSIS — D380 Neoplasm of uncertain behavior of larynx: Secondary | ICD-10-CM | POA: Diagnosis not present

## 2023-09-12 NOTE — Patient Instructions (Signed)
 Orders for Boost faxed to [facility] per request from Joli. Fax confirmation received, orders sent to be scanned.

## 2023-09-13 ENCOUNTER — Telehealth: Payer: Self-pay | Admitting: *Deleted

## 2023-09-13 NOTE — Progress Notes (Signed)
 Patient for US  guided Core RT cervical LN Biopsy on Monday 09/16/23, I called and spoke with the patient on the phone and gave pre-procedure instructions. Pt was made aware to be here at 12:30p and check in at the [facility] registration desk. Pt stated understanding.  Called 09/13/23

## 2023-09-13 NOTE — Telephone Encounter (Signed)
 Pt called back. All questions answered. Reviewed upcoming appts. Pt verbalized understanding.

## 2023-09-13 NOTE — Telephone Encounter (Signed)
 Called pt back. No answer. Left message with my direct number for her to call me back when she can.

## 2023-09-13 NOTE — Telephone Encounter (Signed)
 Patient sent a message to have Whitney [name] to call her back please that was the only thing she said.

## 2023-09-15 ENCOUNTER — Ambulatory Visit: Admission: RE | Admit: 2023-09-15 | Source: Ambulatory Visit

## 2023-09-16 ENCOUNTER — Ambulatory Visit
Admission: RE | Admit: 2023-09-16 | Discharge: 2023-09-16 | Disposition: A | Discharge status: Home / Self Care | Source: Ambulatory Visit | Attending: Internal Medicine | Admitting: Internal Medicine

## 2023-09-16 DIAGNOSIS — R591 Generalized enlarged lymph nodes: Secondary | ICD-10-CM

## 2023-09-16 DIAGNOSIS — C801 Malignant (primary) neoplasm, unspecified: Secondary | ICD-10-CM | POA: Diagnosis not present

## 2023-09-16 DIAGNOSIS — C8590 Non-Hodgkin lymphoma, unspecified, unspecified site: Secondary | ICD-10-CM | POA: Diagnosis not present

## 2023-09-16 DIAGNOSIS — C770 Secondary and unspecified malignant neoplasm of lymph nodes of head, face and neck: Secondary | ICD-10-CM | POA: Diagnosis not present

## 2023-09-16 DIAGNOSIS — R221 Localized swelling, mass and lump, neck: Secondary | ICD-10-CM | POA: Diagnosis present

## 2023-09-16 MED ORDER — LIDOCAINE HCL (PF) 1 % IJ SOLN
10.0000 mL | Freq: Once | INTRAMUSCULAR | Status: AC
  Administered 2023-09-16: 10 mL
  Filled 2023-09-16: qty 10

## 2023-09-17 LAB — SURGICAL PATHOLOGY

## 2023-09-20 DIAGNOSIS — E440 Moderate protein-calorie malnutrition: Secondary | ICD-10-CM | POA: Diagnosis not present

## 2023-09-23 ENCOUNTER — Inpatient Hospital Stay: Attending: Oncology | Admitting: Oncology

## 2023-09-23 ENCOUNTER — Encounter: Payer: Self-pay | Admitting: Oncology

## 2023-09-23 ENCOUNTER — Inpatient Hospital Stay: Admitting: Hospice and Palliative Medicine

## 2023-09-23 ENCOUNTER — Other Ambulatory Visit: Payer: Self-pay | Admitting: Otolaryngology

## 2023-09-23 ENCOUNTER — Other Ambulatory Visit: Payer: Self-pay | Admitting: *Deleted

## 2023-09-23 VITALS — BP 115/90 | HR 102 | Temp 97.3°F | Resp 16 | Ht 64.0 in | Wt <= 1120 oz

## 2023-09-23 DIAGNOSIS — C801 Malignant (primary) neoplasm, unspecified: Secondary | ICD-10-CM | POA: Insufficient documentation

## 2023-09-23 DIAGNOSIS — J387 Other diseases of larynx: Secondary | ICD-10-CM | POA: Diagnosis present

## 2023-09-23 DIAGNOSIS — R221 Localized swelling, mass and lump, neck: Secondary | ICD-10-CM

## 2023-09-23 DIAGNOSIS — R1310 Dysphagia, unspecified: Secondary | ICD-10-CM | POA: Diagnosis not present

## 2023-09-23 DIAGNOSIS — F17210 Nicotine dependence, cigarettes, uncomplicated: Secondary | ICD-10-CM | POA: Diagnosis not present

## 2023-09-23 DIAGNOSIS — G4700 Insomnia, unspecified: Secondary | ICD-10-CM | POA: Insufficient documentation

## 2023-09-23 DIAGNOSIS — R918 Other nonspecific abnormal finding of lung field: Secondary | ICD-10-CM | POA: Diagnosis present

## 2023-09-23 DIAGNOSIS — Z21 Asymptomatic human immunodeficiency virus [HIV] infection status: Secondary | ICD-10-CM | POA: Diagnosis not present

## 2023-09-23 DIAGNOSIS — C770 Secondary and unspecified malignant neoplasm of lymph nodes of head, face and neck: Secondary | ICD-10-CM | POA: Diagnosis not present

## 2023-09-23 DIAGNOSIS — R634 Abnormal weight loss: Secondary | ICD-10-CM | POA: Insufficient documentation

## 2023-09-23 MED ORDER — ONDANSETRON 4 MG PO TBDP: 4.0000 mg | ORAL_TABLET | Freq: Three times a day (TID) | ORAL | 1 refills | Status: DC | PRN

## 2023-09-23 MED ORDER — LORAZEPAM 0.5 MG PO TABS: 0.2500 mg | ORAL_TABLET | Freq: Three times a day (TID) | ORAL | 0 refills | Status: DC | PRN

## 2023-09-23 NOTE — Progress Notes (Unsigned)
 [facility]  [phone]) [phone] [phone]  ID: Whitney [name] OB: [birth year]  MR#: [id number]  RDW#:[id number]  Patient Care Team: Ernie Yancy Roof, MD as PCP - General (Family Medicine) Verdene Gills, RN as Oncology Nurse Navigator Jacobo, Whitney PARAS, MD as Consulting Physician (Oncology)  CHIEF COMPLAINT: Laryngeal mass and lung mass.  INTERVAL HISTORY: Patient returns to clinic today for further evaluation, discussion of her pathology results, and treatment planning.  She continues to have dysphagia and neck pain.  She continues to have hoarseness of voice.  She only can tolerate liquids and continues to have weight loss. She has no neurologic complaints.  She denies any fevers.  She has a poor appetite.  She has no chest pain, cough, or hemoptysis.  She denies any nausea, vomiting, constipation, or diarrhea.  She has no urinary complaints.  Patient offers no further specific complaints today.  REVIEW OF SYSTEMS:   Review of Systems  Constitutional:  Positive for malaise/fatigue and weight loss. Negative for fever.  HENT:  Positive for sore throat.   Respiratory:  Positive for shortness of breath. Negative for cough, hemoptysis and stridor.   Cardiovascular: Negative.  Negative for chest pain and leg swelling.  Gastrointestinal: Negative.  Negative for abdominal pain and nausea.  Genitourinary: Negative.  Negative for dysuria.  Musculoskeletal:  Positive for neck pain. Negative for back pain.  Skin: Negative.  Negative for rash.  Neurological:  Positive for weakness. Negative for dizziness, focal weakness and headaches.  Psychiatric/Behavioral: Negative.  The patient is not nervous/anxious.     As per HPI. Otherwise, a complete review of systems is negative.  PAST MEDICAL HISTORY: Past Medical History:  Diagnosis Date   Anxiety    COPD (chronic obstructive pulmonary disease) (HCC)    GERD (gastroesophageal reflux disease)    HIV  disease (HCC)    Hypertension    PTSD (post-traumatic stress disorder)    Stroke (HCC)     PAST SURGICAL HISTORY: Past Surgical History:  Procedure Laterality Date   CESAREAN SECTION     ESOPHAGOGASTRODUODENOSCOPY (EGD) WITH PROPOFOL  N/A 12/13/2022   Procedure: ESOPHAGOGASTRODUODENOSCOPY (EGD) WITH PROPOFOL ;  Surgeon: Therisa Bi, MD;  Location: [facility] ENDOSCOPY;  Service: Gastroenterology;  Laterality: N/A;   TUBAL LIGATION      FAMILY HISTORY: Family History  Problem Relation Age of Onset   Parkinson's disease Father    CAD Father    Hypertension Mother     ADVANCED DIRECTIVES (Y/N):  N  HEALTH MAINTENANCE: Social History   Tobacco Use   Smoking status: Every Day    Current packs/day: 0.50    Average packs/day: 0.5 packs/day for 36.0 years (18.0 ttl pk-yrs)    Types: Cigarettes   Smokeless tobacco: Never   Tobacco comments:    Smokes 10 cigarettes daily- khj 09/11/2023        Started smoking at 53 years old.    Smoked 1 PPD at her heaviest  Vaping Use   Vaping status: Never Used  Substance Use Topics   Alcohol use: Yes    Comment: ocassionally   Drug use: Yes    Types: Marijuana    Comment: daily occ     Colonoscopy:  PAP:  Bone density:  Lipid panel:  Allergies  Allergen Reactions   Penicillins Hives   Aspirin Rash and Other (See Comments)    Reaction:  GI upset     Current Outpatient Medications  Medication Sig Dispense Refill   abacavir -dolutegravir -lamiVUDine  (TRIUMEQ) 600-50-300 MG tablet  Take 1 tablet by mouth daily.     acetaminophen  (TYLENOL  CHILDRENS) 160 MG/5ML suspension Take 15.6 mLs (500 mg total) by mouth every 6 (six) hours as needed. 236 mL 0   albuterol  (ACCUNEB ) 1.25 MG/3ML nebulizer solution Take 3 mLs (1.25 mg total) by nebulization every 6 (six) hours as needed for wheezing. 75 mL 12   bictegravir-emtricitabine-tenofovir AF (BIKTARVY) 50-200-25 MG TABS tablet Take by mouth daily.     diphenhydrAMINE  (BENADRYL ) 25 MG tablet Take 25  mg by mouth every 6 (six) hours as needed for allergies.     fluticasone (FLONASE) 50 MCG/ACT nasal spray Place 2 sprays into both nostrils daily as needed.     Fluticasone-Salmeterol (ADVAIR) 250-50 MCG/DOSE AEPB Inhale 1 puff into the lungs 2 (two) times daily.     Morphine  Sulfate (MORPHINE  CONCENTRATE) 10 mg / 0.5 ml concentrated solution Take 0.25 mLs (5 mg total) by mouth every 6 (six) hours as needed. 30 mL 0   tiotropium (SPIRIVA ) 18 MCG inhalation capsule Place 18 mcg into inhaler and inhale daily.     triamcinolone ointment (KENALOG) 0.1 % Apply topically.     amLODipine (NORVASC) 10 MG tablet Take 10 mg by mouth daily. (Patient not taking: Reported on 09/23/2023)     atorvastatin (LIPITOR) 80 MG tablet Take 1 tablet by mouth daily. (Patient not taking: Reported on 09/23/2023)     DULoxetine  (CYMBALTA ) 30 MG capsule Take 60 mg by mouth daily. (Patient not taking: Reported on 09/23/2023)     ezetimibe (ZETIA) 10 MG tablet Take 10 mg by mouth daily. (Patient not taking: Reported on 09/23/2023)     famotidine  (PEPCID ) 20 MG tablet Take 1 tablet (20 mg total) by mouth daily. (Patient not taking: Reported on 09/23/2023) 30 tablet 0   feeding supplement, ENSURE ENLIVE, (ENSURE ENLIVE) LIQD Take 237 mLs by mouth 2 (two) times daily between meals. (Patient not taking: Reported on 09/23/2023) 237 mL 12   ferrous sulfate  325 (65 FE) MG tablet Take 1 tablet (325 mg total) by mouth 2 (two) times daily with a meal. (Patient not taking: Reported on 09/23/2023) 60 tablet 0   gabapentin (NEURONTIN) 300 MG capsule Take 1 capsule by mouth in the morning, at noon, and at bedtime. (Patient not taking: Reported on 09/23/2023)     Ipratropium-Albuterol  (COMBIVENT IN) Four (4) times a day. Frequency:QID   Dosage:0.0     Instructions:  Note:Dose: 18-103MCG (Patient not taking: Reported on 09/23/2023)     levofloxacin  (LEVAQUIN ) 750 MG tablet Take 1 tablet (750 mg total) by mouth daily. (Patient not taking: Reported on 09/23/2023) 6  tablet 0   lidocaine  (XYLOCAINE ) 2 % solution Use as directed 15 mLs in the mouth or throat as needed for mouth pain. (Patient not taking: Reported on 09/23/2023) 100 mL 0   Loratadine 10 MG CHEW Chew 1 tablet by mouth daily as needed. (Patient not taking: Reported on 09/23/2023)     losartan (COZAAR) 50 MG tablet Take 50 mg by mouth daily. (Patient not taking: Reported on 09/23/2023)     methylPREDNISolone  (MEDROL  DOSEPAK) 4 MG TBPK tablet follow package directions (Patient not taking: Reported on 09/23/2023) 21 tablet 0   montelukast (SINGULAIR) 10 MG tablet Take 10 mg by mouth daily. (Patient not taking: Reported on 09/23/2023)     naloxone  (NARCAN ) nasal spray 4 mg/0.1 mL SPRAY 1 SPRAY INTO ONE NOSTRIL AS DIRECTED FOR OPIOID OVERDOSE (TURN PERSON ON SIDE AFTER DOSE. IF NO RESPONSE IN 2-3 MINUTES OR PERSON  RESPONDS BUT RELAPSES, REPEAT USING A NEW SPRAY DEVICE AND SPRAY INTO THE OTHER NOSTRIL. CALL 911 AFTER USE.) * EMERGENCY USE ONLY * (Patient not taking: Reported on 09/23/2023) 1 each 0   norethindrone (AYGESTIN) 5 MG tablet Take by mouth. (Patient not taking: Reported on 09/23/2023)     pantoprazole  (PROTONIX ) 40 MG tablet Take 1 tablet (40 mg total) by mouth 2 (two) times daily. (Patient not taking: Reported on 09/23/2023) 90 tablet 1   No current facility-administered medications for this visit.    OBJECTIVE: Vitals:   09/23/23 1318  Temp: (!) 97.3 F (36.3 C)     There is no height or weight on file to calculate BMI.    ECOG FS:1 - Symptomatic but completely ambulatory  General: Cachectic, no acute distress.  Sitting in wheelchair. Eyes: Pink conjunctiva, anicteric sclera. HEENT: Normocephalic, moist mucous membranes. Lungs: No audible wheezing or coughing. Heart: Regular rate and rhythm. Abdomen: Soft, nontender, no obvious distention. Musculoskeletal: No edema, cyanosis, or clubbing. Neuro: Alert, answering all questions appropriately. Cranial nerves grossly intact. Skin: No rashes or  petechiae noted. Psych: Normal affect.  LAB RESULTS:  Lab Results  Component Value Date   NA 140 09/01/2023   K 3.1 (L) 09/01/2023   CL 99 09/01/2023   CO2 31 09/01/2023   GLUCOSE 135 (H) 09/01/2023   BUN 8 09/01/2023   CREATININE 0.61 09/01/2023   CALCIUM 9.5 09/01/2023   PROT 6.9 09/01/2023   ALBUMIN 3.3 (L) 09/01/2023   AST 22 09/01/2023   ALT 15 09/01/2023   ALKPHOS 81 09/01/2023   BILITOT 0.8 09/01/2023   GFRNONAA >60 09/01/2023   GFRAA >60 01/30/2016    Lab Results  Component Value Date   WBC 8.4 09/01/2023   NEUTROABS 6.9 09/01/2023   HGB 12.8 09/01/2023   HCT 42.4 09/01/2023   MCV 86.0 09/01/2023   PLT 253 09/01/2023     STUDIES: US  CORE BIOPSY (LYMPH NODES) Result Date: 09/16/2023 INDICATION: Laryngeal mass with hypermetabolic right cervical lymphadenopathy concerning for primary head and neck cancer with nodal metastatic disease. Patient presents for ultrasound-guided core biopsy to confirm tissue diagnosis. EXAM: ULTRASOUND BIOPSY OF THE LYMPH NODES MEDICATIONS: Whitney [name]. ANESTHESIA/SEDATION: Whitney [name]. FLUOROSCOPY TIME:  Whitney [name]. COMPLICATIONS: Whitney [name] immediate. PROCEDURE: Informed written consent was obtained from the patient after a thorough discussion of the procedural risks, benefits and alternatives. All questions were addressed. Maximal Sterile Barrier Technique was utilized including caps, mask, sterile gowns, sterile gloves, sterile drape, hand hygiene and skin antiseptic. A timeout was performed prior to the initiation of the procedure. Ultrasound was used to evaluate the right cervical lymph nodes. Obvious pathologic lymphadenopathy is identified. Local anesthesia was attained by infiltration with 1% lidocaine . A small dermatotomy was made. Under real-time ultrasound guidance, multiple 18 gauge core biopsies were obtained using the are gone automated biopsy device. Biopsy specimens were placed on a Telfa pad and delivered to pathology for further analysis. IMPRESSION:  Successful ultrasound-guided core biopsy of right cervical lymph node. Electronically Signed   By: Wilkie Lent M.D.   On: 09/16/2023 15:16   NM PET Image Initial (PI) Skull Base To Thigh Result Date: 09/04/2023 CLINICAL DATA:  Initial treatment strategy for neck mass and lung mass. EXAM: NUCLEAR MEDICINE PET SKULL BASE TO THIGH TECHNIQUE: 5.95 mCi F-18 FDG was injected intravenously. Full-ring PET imaging was performed from the skull base to thigh after the radiotracer. CT data was obtained and used for attenuation correction and anatomic localization. Fasting blood glucose: 121 mg/dl COMPARISON:  CT neck and chest from 09/01/2023 FINDINGS: Mediastinal blood pool activity: SUV max 2.3 Liver activity: SUV max NA NECK: Corresponding to the recent CT findings there is a tracer avid tumor extending from the vallecula to the vocal cords and involving the right and left supraglottic larynx and area epigastric folds. This measures approximately 3.2 x 3.7 by 4.6 cm, axial image 22. 1.6 cm right level 3 lymph node has an SUV max of 9.3, axial image 23. Incidental CT findings: Whitney [name]. CHEST: Perihilar left upper lobe lung mass measures 4.5 by 3.9 by 7.6 cm and has an SUV max of 15.3. No signs of postobstructive atelectasis or consolidation. Nodule within the inferior lingula is new from 09/01/2023 and is considered nonspecific measuring 1.1 cm with SUV max of 4.3, axial image 72. Tracer avid low right paratracheal lymph node measures 1.4 cm with SUV max of 8.7, axial image 47. Left paratracheal lymph node measures 8 mm with SUV max of 8.4, axial image 47. Incidental CT findings: Emphysema with diffuse bronchial wall thickening. Aortic atherosclerosis and coronary artery calcifications. No significant pericardial or pleural effusion. ABDOMEN/PELVIS: No abnormal hypermetabolic activity within the liver, pancreas, adrenal glands, or spleen. No hypermetabolic lymph nodes in the abdomen or pelvis. Incidental CT findings:  Whitney [name]. SKELETON: No focal hypermetabolic activity to suggest skeletal metastasis. Incidental CT findings: Whitney [name]. IMPRESSION: 1. Corresponding to the recent CT findings there is a tracer avid tumor extending from the vallecula to the vocal cords and involving the right and left supraglottic larynx and area epigastric folds. 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 2. Tracer avid right level 3 lymph node is compatible with nodal metastasis. 3. Perihilar left upper lobe lung mass is tracer avid and compatible with primary lung carcinoma. 4. Tracer avid bilateral paratracheal lymph nodes are compatible with nodal metastasis. 5. Nodule within the inferior lingula is new from 09/01/2023 and is considered nonspecific measuring 1.1 cm with SUV max of 4.3. This is considered nonspecific and may be inflammatory or neoplastic. 6. Coronary artery calcifications. 7. Aortic Atherosclerosis (ICD10-I70.0) and Emphysema (ICD10-J43.9). Electronically Signed   By: Waddell Calk M.D.   On: 09/04/2023 10:34   CT Soft Tissue Neck W Contrast Result Date: 09/01/2023 EXAM: CT NECK WITH CONTRAST 09/01/2023 08:59:37 AM TECHNIQUE: CT of the neck was performed with the administration of 75 mL of intravenous iohexol  (OMNIPAQUE ) 300 mg/mL solution. Multiplanar reformatted images are provided for review. Automated exposure control, iterative reconstruction, and/or weight based adjustment of the mA/kV was utilized to reduce the radiation dose to as low as reasonably achievable. COMPARISON: Whitney [name] available. CLINICAL HISTORY: Cough, shortness of breath, difficulty swallowing, sore throat. History of HIV. Abnormal chest x-ray, eval for malignancy or other pathology. FINDINGS: AERODIGESTIVE TRACT: Heterogeneously enhancing tumor mass measures 4.0X3.1X3.7 Cm, extending from the vallecula to the vocal cords and asymmetric to the right. The lesion involves the right and left supraglottic larynx and aryepiglottic folds. The supraglottic  airway is narrowed to less than 3 mm. SALIVARY GLANDS: The parotid and submandibular glands are unremarkable. THYROID: Unremarkable. LYMPH NODES: A heterogeneously enhancing right level 3 lymph node measures 2.1 x 1.4 x 2.2 cm. Other homogeneously enhancing right level 2 and level 3 lymph nodes are present. A 9 mm paratracheal lymph node is present. SOFT TISSUES: No mass or fluid collection. BRAIN, ORBITS, SINUSES AND MASTOIDS: No acute abnormality. LUNGS AND MEDIASTINUM: Heterogeneously enhancing mass lesion measures 3.9 x 3.1 cm. Severe central lobular emphysematous changes are present in both lungs. BONES: Mild degenerative  changes are present in the cervical spine. No focal osseous lesions are present. The patient is edentulous. IMPRESSION: 1. Heterogeneously enhancing tumor mass (4.0 x 3.1 x 3.7 cm) extending from the vallecula to the vocal cords, involving the right and left supraglottic larynx and aryepiglottic folds, causing supraglottic airway narrowing to less than 3 mm. 2. Heterogeneously enhancing right level 3 lymph node (2.1 x 1.4 x 2.2 cm) . her homogenously enhancing nodes are present, consistent with metastatic disease to the right neck. 3. Heterogeneously enhancing mass lesion in the mediastinum (3.9 x 3.1 cm) may represent metastatic disease to the chest versus a synchronous primary tumor. . 4. Severe central lobular emphysematous changes in both lungs. Electronically signed by: Lonni Necessary MD 09/01/2023 09:13 AM EDT RP Workstation: [id number]   CT Chest W Contrast Result Date: 09/01/2023 CLINICAL DATA:  Cough, shortness of breath, difficulty swallowing, sore throat, history of HIV, abnormal chest x-ray, concern for malignancy * Tracking Code: BO * EXAM: CT CHEST WITH CONTRAST TECHNIQUE: Multidetector CT imaging of the chest was performed during intravenous contrast administration. RADIATION DOSE REDUCTION: This exam was performed according to the departmental dose-optimization program  which includes automated exposure control, adjustment of the mA and/or kV according to patient size and/or use of iterative reconstruction technique. CONTRAST:  75mL OMNIPAQUE  IOHEXOL  300 MG/ML  SOLN COMPARISON:  Same day chest radiograph FINDINGS: Cardiovascular: Aortic atherosclerosis. Normal heart size. Scattered left coronary artery calcifications. No pericardial effusion. Mediastinum/Nodes: Enlarged pretracheal, AP window, and left hilar lymph nodes, left hilar nodes measuring up to 2.0 x 1.4 cm (series 3, image 75). Thyroid gland, trachea, and esophagus demonstrate no significant findings. Lungs/Pleura: Severe, bullous emphysema. Suprahilar mass of the left upper lobe measuring 3.9 x 3.8 cm (series 5, image 73). No pleural effusion or pneumothorax. Upper Abdomen: No acute abnormality. Musculoskeletal: No chest wall abnormality. No acute osseous findings. IMPRESSION: 1. Suprahilar mass of the left upper lobe measuring 3.9 x 3.8 cm, consistent with primary bronchogenic malignancy. 2. Enlarged mediastinal and left hilar lymph nodes, consistent with nodal metastatic disease. 3. Severe, bullous emphysema. 4. Coronary artery disease. Aortic Atherosclerosis (ICD10-I70.0) and Emphysema (ICD10-J43.9). Electronically Signed   By: Marolyn JONETTA Jaksch M.D.   On: 09/01/2023 09:08   DG Chest Portable 1 View Result Date: 09/01/2023 EXAM: 1 VIEW XRAY OF THE CHEST 09/01/2023 07:42:00 AM COMPARISON: Whitney [name] available. CLINICAL HISTORY: Short of breath. Pt here with SOB for weeks. Pt states she went to her primary and they gave her abx. Pt has a cough but denies a fever. Pt has a hx of COPD. Pt had an episode of vomiting this morning. FINDINGS: LUNGS AND PLEURA: Changes of COPD are noted. A left suprahilar mass is new. The lungs are otherwise clear. No pulmonary edema. No pleural effusion. No pneumothorax. HEART AND MEDIASTINUM: No acute abnormality of the cardiac and mediastinal silhouettes. BONES AND SOFT TISSUES: No acute osseous  abnormality. IMPRESSION: 1. New left suprahilar mass. Recommend CT of the chest with contrast for further evaluation. 2. Changes of COPD. Findings were discussed with Dr. Clarine at 07:50 am. Electronically signed by: Lonni Necessary MD 09/01/2023 07:52 AM EDT RP Workstation: [id number]    ASSESSMENT: Laryngeal mass and lung mass.  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  PET scan results from September 01, 2023 reviewed independently and reported as above with no other obvious metastatic disease.  Although this is likely 2 separate primaries, final diagnosis remains unclear.  We briefly discussed hospice today, but patient insists on pursuing treatment.  Prior to undergoing treatment she will require a tracheostomy as well as a PEG tube for nutrition.  Appreciate ENT and general surgery input.  Plan is to do a combined procedure next week.  Will arrange follow-up back in the cancer center once these are completed.  Appreciate palliative care input. Weight loss: Likely secondary to underlying malignancy and difficulty eating, but it is also unclear what her viral load of HIV is.  She could have cachexia secondary to this.  Appreciate dietary input. HIV: Unclear when patient was last evaluated although she reports that she has been compliant with her medications until recently.  Although at this time, she is having difficulty swallowing pills.   Pain: Patient cannot swallow pills and fentanyl patch is likely will not do much good given her lack of body fat.  Patient was prescribed liquid morphine .  Monitor.  Appreciate palliative care input. Insomnia.  Patient was given a prescription for Ativan  as needed.  I spent a total of 30 minutes reviewing chart data, face-to-face evaluation with the patient, counseling and coordination of care as detailed above.   Patient expressed understanding and was in agreement with this plan. She also understands  that She can call clinic at any time with any questions, concerns, or complaints.    Cancer Staging  No matching staging information was found for the patient.   Whitney [name] Reusing, MD   09/23/2023 1:22 PM

## 2023-09-23 NOTE — Progress Notes (Unsigned)
 Patient had a Biopsy on 09/16/2023. Patient is unable to sleep she would like to get some lorazepam . She needs to get her throat stretched ASAP, she is having to put her fingers in her throat to help open it up. She also needs to have some liquid or dissolvable nausea medication.  Unable to eat.

## 2023-09-24 DIAGNOSIS — C329 Malignant neoplasm of larynx, unspecified: Secondary | ICD-10-CM | POA: Diagnosis not present

## 2023-09-25 ENCOUNTER — Ambulatory Visit (INDEPENDENT_AMBULATORY_CARE_PROVIDER_SITE_OTHER): Payer: Self-pay | Admitting: Surgery

## 2023-09-25 ENCOUNTER — Encounter: Payer: Self-pay | Admitting: Surgery

## 2023-09-25 ENCOUNTER — Inpatient Hospital Stay

## 2023-09-25 VITALS — BP 124/97 | HR 96 | Temp 97.8°F | Ht 64.0 in | Wt <= 1120 oz

## 2023-09-25 DIAGNOSIS — C760 Malignant neoplasm of head, face and neck: Secondary | ICD-10-CM

## 2023-09-25 DIAGNOSIS — C7989 Secondary malignant neoplasm of other specified sites: Secondary | ICD-10-CM | POA: Diagnosis not present

## 2023-09-25 NOTE — H&P (View-Only) (Signed)
 Patient ID: Whitney [name], female   DOB: [birth year], 53 y.o.   MRN: [id number]  HPI Whitney [name] is a 53 y.o. female seen in consultation at the request  of Dr Jacobo, case d/w him in detail She is HIV-positive woman who recently presented to the emergency room with increasing shortness of breath. She also reports a significant amount of weight loss greater than 30 pounds as well as difficulty swallowing recently.  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 I was asked to place G tube and port  She has a poor appetite. She has no neck pain, but has increased hoarseness of voice. She has no chest pain, cough, or hemoptysis. She denies any nausea, vomiting, constipation, or diarrhea. She has no urinary complaints. Patient offers no further specific complaints today.  She wishes to do xrt and chemo. Prior C section and tubal ligation. CBC nml Pl and hb,  Case d./w Dr. Blair from ENT in detail HPI  Past Medical History:  Diagnosis Date   Anxiety    COPD (chronic obstructive pulmonary disease) (HCC)    GERD (gastroesophageal reflux disease)    HIV disease (HCC)    Hypertension    PTSD (post-traumatic stress disorder)    Stroke [facility])     Past Surgical History:  Procedure Laterality Date   CESAREAN SECTION     ESOPHAGOGASTRODUODENOSCOPY (EGD) WITH PROPOFOL  N/A 12/13/2022   Procedure: ESOPHAGOGASTRODUODENOSCOPY (EGD) WITH PROPOFOL ;  Surgeon: Therisa Bi, MD;  Location: [facility] ENDOSCOPY;  Service: Gastroenterology;  Laterality: N/A;   TUBAL LIGATION      Family History  Problem Relation Age of Onset   Parkinson's disease Father    CAD Father    Hypertension Mother     Social History Social History   Tobacco Use   Smoking status: Every Day    Current packs/day: 0.50    Average packs/day: 0.5 packs/day for 36.0 years (18.0 ttl pk-yrs)    Types: Cigarettes   Smokeless tobacco: Never   Tobacco comments:    Smokes  10 cigarettes daily- khj 09/11/2023        Started smoking at 53 years old.    Smoked 1 PPD at her heaviest  Vaping Use   Vaping status: Never Used  Substance Use Topics   Alcohol use: Yes    Comment: ocassionally   Drug use: Yes    Types: Marijuana    Comment: daily occ    Allergies  Allergen Reactions   Penicillins Hives   Aspirin Rash and Other (See Comments)    Reaction:  GI upset     Current Outpatient Medications  Medication Sig Dispense Refill   abacavir -dolutegravir -lamiVUDine  (TRIUMEQ) 600-50-300 MG tablet Take 1 tablet by mouth daily.     acetaminophen  (TYLENOL  CHILDRENS) 160 MG/5ML suspension Take 15.6 mLs (500 mg total) by mouth every 6 (six) hours as needed. 236 mL 0   albuterol  (ACCUNEB ) 1.25 MG/3ML nebulizer solution Take 3 mLs (1.25 mg total) by nebulization every 6 (six) hours as needed for wheezing. 75 mL 12   bictegravir-emtricitabine-tenofovir AF (BIKTARVY) 50-200-25 MG TABS tablet Take by mouth daily.     diphenhydrAMINE  (BENADRYL ) 25 MG tablet Take 25 mg by mouth every 6 (six) hours as needed for allergies.     fluticasone (FLONASE) 50 MCG/ACT nasal spray Place 2 sprays into both nostrils daily as needed.     Fluticasone-Salmeterol (ADVAIR) 250-50 MCG/DOSE AEPB Inhale 1 puff into the  lungs 2 (two) times daily.     levofloxacin  (LEVAQUIN ) 750 MG tablet Take 1 tablet (750 mg total) by mouth daily. 6 tablet 0   LORazepam  (ATIVAN ) 0.5 MG tablet Take 0.5 tablets (0.25 mg total) by mouth every 8 (eight) hours as needed for anxiety. 30 tablet 0   Morphine  Sulfate (MORPHINE  CONCENTRATE) 10 mg / 0.5 ml concentrated solution Take 0.25 mLs (5 mg total) by mouth every 6 (six) hours as needed. 30 mL 0   ondansetron  (ZOFRAN -ODT) 4 MG disintegrating tablet Take 1 tablet (4 mg total) by mouth every 8 (eight) hours as needed for nausea or vomiting. 30 tablet 1   tiotropium (SPIRIVA ) 18 MCG inhalation capsule Place 18 mcg into inhaler and inhale daily.     triamcinolone ointment  (KENALOG) 0.1 % Apply topically.     DULoxetine  (CYMBALTA ) 30 MG capsule Take 60 mg by mouth daily. (Patient not taking: Reported on 09/23/2023)     ezetimibe (ZETIA) 10 MG tablet Take 10 mg by mouth daily. (Patient not taking: Reported on 09/23/2023)     famotidine  (PEPCID ) 20 MG tablet Take 1 tablet (20 mg total) by mouth daily. (Patient not taking: Reported on 09/23/2023) 30 tablet 0   feeding supplement, ENSURE ENLIVE, (ENSURE ENLIVE) LIQD Take 237 mLs by mouth 2 (two) times daily between meals. (Patient not taking: Reported on 09/23/2023) 237 mL 12   ferrous sulfate  325 (65 FE) MG tablet Take 1 tablet (325 mg total) by mouth 2 (two) times daily with a meal. (Patient not taking: Reported on 09/23/2023) 60 tablet 0   gabapentin (NEURONTIN) 300 MG capsule Take 1 capsule by mouth in the morning, at noon, and at bedtime. (Patient not taking: Reported on 09/23/2023)     Ipratropium-Albuterol  (COMBIVENT IN) Four (4) times a day. Frequency:QID   Dosage:0.0     Instructions:  Note:Dose: 18-103MCG (Patient not taking: Reported on 09/23/2023)     lidocaine  (XYLOCAINE ) 2 % solution Use as directed 15 mLs in the mouth or throat as needed for mouth pain. (Patient not taking: Reported on 09/23/2023) 100 mL 0   Loratadine 10 MG CHEW Chew 1 tablet by mouth daily as needed. (Patient not taking: Reported on 09/23/2023)     losartan (COZAAR) 50 MG tablet Take 50 mg by mouth daily. (Patient not taking: Reported on 09/23/2023)     methylPREDNISolone  (MEDROL  DOSEPAK) 4 MG TBPK tablet follow package directions (Patient not taking: Reported on 09/23/2023) 21 tablet 0   montelukast (SINGULAIR) 10 MG tablet Take 10 mg by mouth daily. (Patient not taking: Reported on 09/23/2023)     naloxone  (NARCAN ) nasal spray 4 mg/0.1 mL SPRAY 1 SPRAY INTO ONE NOSTRIL AS DIRECTED FOR OPIOID OVERDOSE (TURN PERSON ON SIDE AFTER DOSE. IF NO RESPONSE IN 2-3 MINUTES OR PERSON RESPONDS BUT RELAPSES, REPEAT USING A NEW SPRAY DEVICE AND SPRAY INTO THE OTHER NOSTRIL. CALL  911 AFTER USE.) * EMERGENCY USE ONLY * (Patient not taking: Reported on 09/23/2023) 1 each 0   norethindrone (AYGESTIN) 5 MG tablet Take by mouth. (Patient not taking: Reported on 09/23/2023)     pantoprazole  (PROTONIX ) 40 MG tablet Take 1 tablet (40 mg total) by mouth 2 (two) times daily. (Patient not taking: Reported on 09/23/2023) 90 tablet 1   No current facility-administered medications for this visit.     Review of Systems Full ROS  was asked and was negative except for the information on the HPI  Physical Exam Blood pressure (!) 124/97, pulse 96, temperature 97.8  F (36.6 C), temperature source Oral, height 5' 4 (1.626 m), weight 68 lb 9.6 oz (31.1 kg), last menstrual period 08/29/2014, SpO2 100%. CONSTITUTIONAL: [place]coli ill and malnourished. EYES: Pupils are equal, round, Sclera are non-icteric. EARS, NOSE, MOUTH AND THROAT: The oropharynx is clear. The oral mucosa is pink and moist. Hearing is intact to voice. Neck: Large neck mass measuring 4 cm solid located on level III of right neck. RESPIRATORY:  Lungs are clear. There is normal respiratory effort, with equal breath sounds bilaterally, and without pathologic use of accessory muscles. CARDIOVASCULAR: Heart is regular without murmurs, gallops, or rubs. GI: The abdomen is  soft, nontender, and nondistended. There are no palpable masses. There is no hepatosplenomegaly. There are normal bowel sounds in all quadrants. GU: Rectal deferred.   MUSCULOSKELETAL: Normal muscle strength and tone. No cyanosis or edema.   SKIN: Turgor is good and there are no pathologic skin lesions or ulcers. NEUROLOGIC: Motor and sensation is grossly normal. Cranial nerves are grossly intact. PSYCH:  Oriented to person, place and time. Affect is normal.  Data Reviewed I have personally reviewed the patient's imaging, laboratory findings and medical records.    Assessment/Plan 53 year old female with metastatic head and neck cancer.  She does have a large  fixed lesion mass adenopathy on right neck.  With that being said I probably will be able to use a right jugular approach.  We will likely attempt a left IJ approach for the port placement.  I do think that open G-tube is probably the best on her given her malnutrition.  Procedure discussed with the patient and the family in detail.  The risk the benefit and the possible complications including but not limited to: Bleeding, infection vascular injury pneumothorax, bowel injuries, G-tube issues.  They understand and wish to proceed. Will do a combined case with ENT they will take care of the airway and the tracheotomy first then we will proceed with port and G-tube placement. I do understand that these procedures are merely adjuvant and not therapeutic I personally spent a total of 60 minutes in the care of the patient today including performing a medically appropriate exam/evaluation, counseling and educating, placing orders, referring and communicating with other health care professionals, documenting clinical information in the EHR, independently interpreting and reviewing images studies and coordinating care.    Laneta Luna, MD FACS General Surgeon 09/25/2023, 3:00 PM

## 2023-09-25 NOTE — Patient Instructions (Signed)
 We have seen you today and have spoken about your port placement. This will be scheduled at [facility] with Dr. Jordis.  If you are on any injectable weight loss medication, you will need to stop taking your GLP-1 injectable (weight loss) medications 8 days before your surgery to avoid any complications with anesthesia.   Please see the [facility]) Sheet provided for further details. Our surgery scheduler will call you to look at surgery dates and go over surgery information.   Please call our office with any questions or concerns that you have.   [facility]) A central line is a soft, flexible tube (catheter) that can be used to collect blood for testing or to give medicine or nutrition through a vein. The tip of the central line ends in a large vein just above the heart called the vena cava. A central line may be placed because: You need to get medicines or fluids through an IV tube for a long period of time. You need nutrition but cannot eat or absorb nutrients. The veins in your hands or arms are hard to access. You need to have blood taken often for blood tests. You need a blood transfusion You need chemotherapy or dialysis.  There are many types of central lines: Peripherally inserted central catheter (PICC) line. This type is used for intermediate access to long-term access of one week or more. It can be used to draw blood and give fluids or medicines. A PICC looks like an IV tube, but it goes up the arm to the heart. It is usually inserted in the upper arm and taped in place on the arm. Tunneled central line. This type is used for long-term therapy and dialysis. It is placed in a large vein in the neck, chest, or groin. A tunneled central line is inserted through a small incision made over the vein and is advanced into the heart. It is tunneled beneath the skin and brought out through a second incision. Non-tunneled central line. This type is used for short-term access,  usually of a maximum of 7 days. It is often used in the emergency department. A non-tunneled central line is inserted in the neck, chest, or groin. Implanted port. This type is used for long-term therapy. It can stay in place longer than other types of central lines. An implanted port is normally inserted in the upper chest but can also be placed in the upper arm or in the abdomen. It is inserted and removed with surgery, and it is accessed using a special needle.  The type of central line that you receive depends on how long you will need it, your medical condition, and the condition of your veins. What are the risks? Using any type of central line has risks that you should be aware of, including: Infection. A blood clot that blocks the central line or forms in the vein and travels to the heart. Bleeding from the place where the central line was put in. Developing a hole or crack within the central line. If this happens, the central line will need to be replaced. Developing an abnormal heart rhythm (arrhythmia). This is rare. Central line failure.  Follow these instructions at home: Flushing and cleaning the central line Follow instructions from the health care provider about flushing and cleaning the central line. Wear a mask when flushing or cleaning the central line. Before you flush or clean the central line: Wash your hands with soap and water. Clean the central line  hub with rubbing alcohol. Insertion site care Keep the insertion site of your central line clean and dry at all times. Check your incision or central line site every day for signs of infection. Check for: More redness, swelling, or pain. More fluid or blood. Warmth. Pus or a bad smell. General instructions Follow instructions from your health care provider for the type of device that you have. If the central line accidentally gets pulled on, make sure: The bandage (dressing) is okay. There is no bleeding. The line  has not been pulled out. Return to your normal activities as told by your health care provider. Ask your health care provider what activities are safe for you. You may be restricted from lifting or making repetitive arm movements on the side with the catheter. Do not swim or bathe unless your health care provider approves. Keep your dressing dry. Your health care provider can instruct you about how to keep your specific type of dressing from getting wet. Keep all follow-up visits as told by your health care provider. This is important. Contact a health care provider if: You have more redness, swelling, or pain around your incision. You have more fluid or blood coming from your incision. Your incision feels warm to the touch. You have pus or a bad smell coming from your incision. Get help right away if: You have: Chills. A fever. Shortness of breath. Trouble breathing. Chest pain. Swelling in your neck, face, chest, or arm on the side of your central line. You are coughing. You feel your heart beating rapidly or skipping beats. You feel dizzy or you faint. Your incision or central line site has red streaks spreading away from the area. Your incision or central line site is bleeding and does not stop. Your central line is difficult to flush or will not flush. You do not get a blood return from the central line. Your central line gets loose or comes out. Your central line gets damaged. Your catheter leaks when flushed or when fluids are infused into it. This information is not intended to replace advice given to you by your health care provider. Make sure you discuss any questions you have with your health care provider. Document Released: 03/29/2005 Document Revised: 10/05/2015 Document Reviewed: 09/14/2015 Elsevier Interactive Patient Education  2017 [employer]    How to Care for a Feeding Tube  A feeding tube is a soft, flexible tube used to give medicine, water, and liquid  food. A person may need a feeding tube if they have trouble swallowing or cannot have food or medicine by mouth. The tube is put right into the stomach.  The following information gives steps on how to care for the skin around a feeding tube, or the tube site. This information is meant for adults and children over 1 year of age. Supplies needed: Clean washcloth, gauze pads, or soft paper towels. Cotton swabs. Skin barrier ointment or cream, such as petroleum jelly. Soap and water, or sterile saline. Pre-cut foam pads or gauze for around the tube. Medical tape. Anchoring device. This is not always used. Syringe. Cleaning brush or toothbrush. This is only used for cleanings. How to care for the tube site  Have all supplies ready and near you. Wash your hands with soap and water for at least 20 seconds. Remove the foam pad or gauze under the tube stabilizing disc (bumper), if there is one. You may have to do this a few times a day right after the feeding  tube is put in because the gauze or pad will get soiled or wet. As the site heals, you may not have to replace the pads or gauze as often. But you still need to clean and check the area every day. Gently turn the bumper so it does not stick to the skin. Check the skin around the tube site for redness, rash, swelling, drainage, or growth of extra tissue. Check the number on the tube (guide mark) where it meets the skin. It should not change. If it does, the feeding tube might be coming out. Moisten gauze pads and cotton swabs with water and soap, or saline. Take the moistenedcotton swab and wipe under the bumper, right near the opening in the belly (stoma). Take the moistened gauze pad and clean the skin around the tube site. If you used soap, rinse with water. Use a washcloth, dry gauze pad, or soft paper towel to dry the skin and stoma site. Dry the tube and bumper too. If the skin is red, put a barrier cream or ointment on a cotton swab.  Apply it around the site, under the bumper. This will help the site heal. Put a new pre-cut foam pad or gauze around the tube, under the bumper. If the site is healed and there is no drainage, you can leave off the foam pads or gauze. Put tape around the edges of the foam pad or gauze to keep it in place. Use tape or an anchoring device to hold the loose end of the tube against the skin. This helps keep the tube from getting pulled on. Change where you put the tape to avoid damaging the skin. Throw away used supplies. Wash your hands with soap and water for at least 20 seconds. How to care for the moat If the end of the feeding tube has a moat, clean it once a day or as told. The moat is the open space inside the feeding tube connector. Follow the manufacturer's instructions on how to clean the moat. You may be told to: Remove the cap from the end of the feeding tube. Cover the hole in the middle of the tube port with a cleaning brush. Hold the end of the tube over a deep bowl, or wrap it with paper towels or a washcloth to soak up the water. Use a syringe of water to flush out the moat. Use the cleaning brush or toothbrush to clean the tube cap and around the inside of the moat. This brush can only be used for tube cleanings. Dry the end of the feeding tube and the cap with gauze or paper towel. Put the cap back on the feeding tube. General tips Use, clean, and reuse feeding tube equipment only as told by the health care provider. Do not use antibiotic ointment or cream on the feeding tube site unless you're told to. Contact a health care provider if: You notice a change in the guide marks on the feeding tube where it meets the skin. Changes could mean the feeding tube has moved or is coming out. You see any of these on the skin around the tube site: Redness. Rash. Swelling. Drainage. Extra growth of tissue. You have questions or concerns about the feeding tube or the tube site. This  information is not intended to replace advice given to you by your health care provider. Make sure you discuss any questions you have with your health care provider. Document Revised: 06/07/2022 Document Reviewed: 06/07/2022 Elsevier Patient Education  2024 [employer]

## 2023-09-25 NOTE — Progress Notes (Signed)
 Patient ID: Whitney [name], female   DOB: [birth year], 53 y.o.   MRN: [id number]  HPI Whitney [name] is a 53 y.o. female seen in consultation at the request  of Dr Jacobo, case d/w him in detail She is HIV-positive woman who recently presented to the emergency room with increasing shortness of breath. She also reports a significant amount of weight loss greater than 30 pounds as well as difficulty swallowing recently.  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 I was asked to place G tube and port  She has a poor appetite. She has no neck pain, but has increased hoarseness of voice. She has no chest pain, cough, or hemoptysis. She denies any nausea, vomiting, constipation, or diarrhea. She has no urinary complaints. Patient offers no further specific complaints today.  She wishes to do xrt and chemo. Prior C section and tubal ligation. CBC nml Pl and hb,  Case d./w Dr. Blair from ENT in detail HPI  Past Medical History:  Diagnosis Date   Anxiety    COPD (chronic obstructive pulmonary disease) (HCC)    GERD (gastroesophageal reflux disease)    HIV disease (HCC)    Hypertension    PTSD (post-traumatic stress disorder)    Stroke [facility])     Past Surgical History:  Procedure Laterality Date   CESAREAN SECTION     ESOPHAGOGASTRODUODENOSCOPY (EGD) WITH PROPOFOL  N/A 12/13/2022   Procedure: ESOPHAGOGASTRODUODENOSCOPY (EGD) WITH PROPOFOL ;  Surgeon: Therisa Bi, MD;  Location: [facility] ENDOSCOPY;  Service: Gastroenterology;  Laterality: N/A;   TUBAL LIGATION      Family History  Problem Relation Age of Onset   Parkinson's disease Father    CAD Father    Hypertension Mother     Social History Social History   Tobacco Use   Smoking status: Every Day    Current packs/day: 0.50    Average packs/day: 0.5 packs/day for 36.0 years (18.0 ttl pk-yrs)    Types: Cigarettes   Smokeless tobacco: Never   Tobacco comments:    Smokes  10 cigarettes daily- khj 09/11/2023        Started smoking at 53 years old.    Smoked 1 PPD at her heaviest  Vaping Use   Vaping status: Never Used  Substance Use Topics   Alcohol use: Yes    Comment: ocassionally   Drug use: Yes    Types: Marijuana    Comment: daily occ    Allergies  Allergen Reactions   Penicillins Hives   Aspirin Rash and Other (See Comments)    Reaction:  GI upset     Current Outpatient Medications  Medication Sig Dispense Refill   abacavir -dolutegravir -lamiVUDine  (TRIUMEQ) 600-50-300 MG tablet Take 1 tablet by mouth daily.     acetaminophen  (TYLENOL  CHILDRENS) 160 MG/5ML suspension Take 15.6 mLs (500 mg total) by mouth every 6 (six) hours as needed. 236 mL 0   albuterol  (ACCUNEB ) 1.25 MG/3ML nebulizer solution Take 3 mLs (1.25 mg total) by nebulization every 6 (six) hours as needed for wheezing. 75 mL 12   bictegravir-emtricitabine-tenofovir AF (BIKTARVY) 50-200-25 MG TABS tablet Take by mouth daily.     diphenhydrAMINE  (BENADRYL ) 25 MG tablet Take 25 mg by mouth every 6 (six) hours as needed for allergies.     fluticasone (FLONASE) 50 MCG/ACT nasal spray Place 2 sprays into both nostrils daily as needed.     Fluticasone-Salmeterol (ADVAIR) 250-50 MCG/DOSE AEPB Inhale 1 puff into the  lungs 2 (two) times daily.     levofloxacin  (LEVAQUIN ) 750 MG tablet Take 1 tablet (750 mg total) by mouth daily. 6 tablet 0   LORazepam  (ATIVAN ) 0.5 MG tablet Take 0.5 tablets (0.25 mg total) by mouth every 8 (eight) hours as needed for anxiety. 30 tablet 0   Morphine  Sulfate (MORPHINE  CONCENTRATE) 10 mg / 0.5 ml concentrated solution Take 0.25 mLs (5 mg total) by mouth every 6 (six) hours as needed. 30 mL 0   ondansetron  (ZOFRAN -ODT) 4 MG disintegrating tablet Take 1 tablet (4 mg total) by mouth every 8 (eight) hours as needed for nausea or vomiting. 30 tablet 1   tiotropium (SPIRIVA ) 18 MCG inhalation capsule Place 18 mcg into inhaler and inhale daily.     triamcinolone ointment  (KENALOG) 0.1 % Apply topically.     DULoxetine  (CYMBALTA ) 30 MG capsule Take 60 mg by mouth daily. (Patient not taking: Reported on 09/23/2023)     ezetimibe (ZETIA) 10 MG tablet Take 10 mg by mouth daily. (Patient not taking: Reported on 09/23/2023)     famotidine  (PEPCID ) 20 MG tablet Take 1 tablet (20 mg total) by mouth daily. (Patient not taking: Reported on 09/23/2023) 30 tablet 0   feeding supplement, ENSURE ENLIVE, (ENSURE ENLIVE) LIQD Take 237 mLs by mouth 2 (two) times daily between meals. (Patient not taking: Reported on 09/23/2023) 237 mL 12   ferrous sulfate  325 (65 FE) MG tablet Take 1 tablet (325 mg total) by mouth 2 (two) times daily with a meal. (Patient not taking: Reported on 09/23/2023) 60 tablet 0   gabapentin (NEURONTIN) 300 MG capsule Take 1 capsule by mouth in the morning, at noon, and at bedtime. (Patient not taking: Reported on 09/23/2023)     Ipratropium-Albuterol  (COMBIVENT IN) Four (4) times a day. Frequency:QID   Dosage:0.0     Instructions:  Note:Dose: 18-103MCG (Patient not taking: Reported on 09/23/2023)     lidocaine  (XYLOCAINE ) 2 % solution Use as directed 15 mLs in the mouth or throat as needed for mouth pain. (Patient not taking: Reported on 09/23/2023) 100 mL 0   Loratadine 10 MG CHEW Chew 1 tablet by mouth daily as needed. (Patient not taking: Reported on 09/23/2023)     losartan (COZAAR) 50 MG tablet Take 50 mg by mouth daily. (Patient not taking: Reported on 09/23/2023)     methylPREDNISolone  (MEDROL  DOSEPAK) 4 MG TBPK tablet follow package directions (Patient not taking: Reported on 09/23/2023) 21 tablet 0   montelukast (SINGULAIR) 10 MG tablet Take 10 mg by mouth daily. (Patient not taking: Reported on 09/23/2023)     naloxone  (NARCAN ) nasal spray 4 mg/0.1 mL SPRAY 1 SPRAY INTO ONE NOSTRIL AS DIRECTED FOR OPIOID OVERDOSE (TURN PERSON ON SIDE AFTER DOSE. IF NO RESPONSE IN 2-3 MINUTES OR PERSON RESPONDS BUT RELAPSES, REPEAT USING A NEW SPRAY DEVICE AND SPRAY INTO THE OTHER NOSTRIL. CALL  [phone] AFTER USE.) * EMERGENCY USE ONLY * (Patient not taking: Reported on 09/23/2023) 1 each 0   norethindrone (AYGESTIN) 5 MG tablet Take by mouth. (Patient not taking: Reported on 09/23/2023)     pantoprazole  (PROTONIX ) 40 MG tablet Take 1 tablet (40 mg total) by mouth 2 (two) times daily. (Patient not taking: Reported on 09/23/2023) 90 tablet 1   No current facility-administered medications for this visit.     Review of Systems Full ROS  was asked and was negative except for the information on the HPI  Physical Exam Blood pressure (!) 124/97, pulse 96, temperature 97.8  F (36.6 C), temperature source Oral, height 5' 4 (1.626 m), weight 68 lb 9.6 oz (31.1 kg), last menstrual period 08/29/2014, SpO2 100%. CONSTITUTIONAL: [place] ill and malnourished. EYES: Pupils are equal, round, Sclera are non-icteric. EARS, NOSE, MOUTH AND THROAT: The oropharynx is clear. The oral mucosa is pink and moist. Hearing is intact to voice. Neck: Large neck mass measuring 4 cm solid located on level III of right neck. RESPIRATORY:  Lungs are clear. There is normal respiratory effort, with equal breath sounds bilaterally, and without pathologic use of accessory muscles. CARDIOVASCULAR: Heart is regular without murmurs, gallops, or rubs. GI: The abdomen is  soft, nontender, and nondistended. There are no palpable masses. There is no hepatosplenomegaly. There are normal bowel sounds in all quadrants. GU: Rectal deferred.   MUSCULOSKELETAL: Normal muscle strength and tone. No cyanosis or edema.   SKIN: Turgor is good and there are no pathologic skin lesions or ulcers. NEUROLOGIC: Motor and sensation is grossly normal. Cranial nerves are grossly intact. PSYCH:  Oriented to person, place and time. Affect is normal.  Data Reviewed I have personally reviewed the patient's imaging, laboratory findings and medical records.    Assessment/Plan 53 year old female with metastatic head and neck cancer.  She does have a large  fixed lesion mass adenopathy on right neck.  With that being said I probably will be able to use a right jugular approach.  We will likely attempt a left IJ approach for the port placement.  I do think that open G-tube is probably the best on her given her malnutrition.  Procedure discussed with the patient and the family in detail.  The risk the benefit and the possible complications including but not limited to: Bleeding, infection vascular injury pneumothorax, bowel injuries, G-tube issues.  They understand and wish to proceed. Will do a combined case with ENT they will take care of the airway and the tracheotomy first then we will proceed with port and G-tube placement. I do understand that these procedures are merely adjuvant and not therapeutic I personally spent a total of 60 minutes in the care of the patient today including performing a medically appropriate exam/evaluation, counseling and educating, placing orders, referring and communicating with other health care professionals, documenting clinical information in the EHR, independently interpreting and reviewing images studies and coordinating care.    Laneta Luna, MD FACS General Surgeon 09/25/2023, 3:00 PM

## 2023-09-26 ENCOUNTER — Emergency Department
Admission: EM | Admit: 2023-09-26 | Discharge: 2023-09-26 | Discharge status: Against Medical Advice | Attending: Emergency Medicine | Admitting: Emergency Medicine

## 2023-09-26 ENCOUNTER — Telehealth: Payer: Self-pay | Admitting: Surgery

## 2023-09-26 ENCOUNTER — Encounter: Payer: Self-pay | Admitting: *Deleted

## 2023-09-26 ENCOUNTER — Other Ambulatory Visit: Payer: Self-pay

## 2023-09-26 ENCOUNTER — Emergency Department

## 2023-09-26 DIAGNOSIS — I10 Essential (primary) hypertension: Secondary | ICD-10-CM | POA: Diagnosis not present

## 2023-09-26 DIAGNOSIS — Z21 Asymptomatic human immunodeficiency virus [HIV] infection status: Secondary | ICD-10-CM | POA: Diagnosis not present

## 2023-09-26 DIAGNOSIS — R0902 Hypoxemia: Secondary | ICD-10-CM

## 2023-09-26 DIAGNOSIS — Z5329 Procedure and treatment not carried out because of patient's decision for other reasons: Secondary | ICD-10-CM | POA: Diagnosis not present

## 2023-09-26 DIAGNOSIS — J449 Chronic obstructive pulmonary disease, unspecified: Secondary | ICD-10-CM | POA: Insufficient documentation

## 2023-09-26 DIAGNOSIS — R4182 Altered mental status, unspecified: Secondary | ICD-10-CM | POA: Diagnosis not present

## 2023-09-26 DIAGNOSIS — R0602 Shortness of breath: Secondary | ICD-10-CM | POA: Diagnosis present

## 2023-09-26 LAB — COMPREHENSIVE METABOLIC PANEL WITH GFR
ALT: 30 U/L (ref 0–44)
AST: 28 U/L (ref 15–41)
Albumin: 3.1 g/dL — ABNORMAL LOW (ref 3.5–5.0)
Alkaline Phosphatase: 109 U/L (ref 38–126)
Anion gap: 10 (ref 5–15)
BUN: 26 mg/dL — ABNORMAL HIGH (ref 6–20)
CO2: 35 mmol/L — ABNORMAL HIGH (ref 22–32)
Calcium: 11.3 mg/dL — ABNORMAL HIGH (ref 8.9–10.3)
Chloride: 97 mmol/L — ABNORMAL LOW (ref 98–111)
Creatinine, Ser: 0.63 mg/dL (ref 0.44–1.00)
GFR, Estimated: >60 mL/min (ref >60)
Glucose, Bld: 99 mg/dL (ref 70–99)
Potassium: 3.9 mmol/L (ref 3.5–5.1)
Sodium: 142 mmol/L (ref 135–145)
Total Bilirubin: 0.7 mg/dL (ref 0.0–1.2)
Total Protein: 6.7 g/dL (ref 6.5–8.1)

## 2023-09-26 LAB — URINALYSIS, ROUTINE W REFLEX MICROSCOPIC
Bilirubin Urine: NEGATIVE
Glucose, UA: NEGATIVE mg/dL
Hgb urine dipstick: NEGATIVE
Ketones, ur: NEGATIVE mg/dL
Nitrite: NEGATIVE
Protein, ur: NEGATIVE mg/dL
Specific Gravity, Urine: 1.01 (ref 1.005–1.030)
WBC, UA: >50 WBC/hpf (ref 0–5)
pH: 7 (ref 5.0–8.0)

## 2023-09-26 LAB — CBC
HCT: 46.5 % — ABNORMAL HIGH (ref 36.0–46.0)
Hemoglobin: 13.9 g/dL (ref 12.0–15.0)
MCH: 25.6 pg — ABNORMAL LOW (ref 26.0–34.0)
MCHC: 29.9 g/dL — ABNORMAL LOW (ref 30.0–36.0)
MCV: 85.6 fL (ref 80.0–100.0)
Platelets: 202 K/uL (ref 150–400)
RBC: 5.43 MIL/uL — ABNORMAL HIGH (ref 3.87–5.11)
RDW: 14.6 % (ref 11.5–15.5)
WBC: 6.6 K/uL (ref 4.0–10.5)
nRBC: 0 % (ref 0.0–0.2)

## 2023-09-26 MED ORDER — LACTATED RINGERS IV BOLUS
1000.0000 mL | Freq: Once | INTRAVENOUS | Status: AC
  Administered 2023-09-26: 1000 mL via INTRAVENOUS

## 2023-09-26 NOTE — ED Triage Notes (Addendum)
 Pt to triage via wheelchair.  Daughter states pt dx with throat and lung mass 3 weeks ago   pt scheduled for trach and peg tube next week.  Pt with weakness and altered mental status.  Pt alert.  Pt to room 5 from triage.

## 2023-09-26 NOTE — Telephone Encounter (Signed)
 Patient has been advised of Pre-Admission date/time, and Surgery date at [facility].  Surgery Date: 10/02/23 Preadmission Testing Date: 09/30/23 (phone 1p-4p)  Patient informed of the scheduling process and surgery information given at time of office visit.  Patient has been made aware to call [phone], between 1-3:00pm the day before surgery, to find out what time to arrive for surgery.

## 2023-09-26 NOTE — ED Provider Notes (Signed)
 [facility] Provider Note   Event Date/Time   First MD Initiated Contact with Patient 09/26/23 1818     (approximate) History  Weakness and Altered Mental Status  HPI Whitney [name] is a 53 y.o. female with a past medical history of COPD, HIV, anxiety/depression, hypertension, and recent diagnosis of a neck mass who presents with her caregiver complaining of generalized weakness and intermittent altered mental status.  Caregiver states that patient will occasionally become confused and text people that she does not mean to or talk to people who were not there.  Patient has also been unable to eat and drink well due to mass effect in the neck.  Patient states that she becomes very short of breath as well and believes she may need supplemental oxygen at home.  Patient denies any exacerbating or relieving factors for the symptoms. ROS: Patient currently denies any vision changes, tinnitus, difficulty speaking, facial droop, sore throat, chest pain, abdominal pain, nausea/vomiting/diarrhea, dysuria, or weakness/numbness/paresthesias in any extremity   Physical Exam  Triage Vital Signs: ED Triage Vitals  Encounter Vitals Group     BP 09/26/23 1815 128/74     Girls Systolic BP Percentile --      Girls Diastolic BP Percentile --      Boys Systolic BP Percentile --      Boys Diastolic BP Percentile --      Pulse Rate 09/26/23 1815 79     Resp 09/26/23 1815 18     Temp 09/26/23 1815 98.3 F (36.8 C)     Temp Source 09/26/23 1815 Oral     SpO2 09/26/23 1815 95 %     Weight 09/26/23 1806 68 lb (30.8 kg)     Height 09/26/23 1806 5' 4 (1.626 m)     Head Circumference --      Peak Flow --      Pain Score 09/26/23 1805 7     Pain Loc --      Pain Education --      Exclude from Growth Chart --    Most recent vital signs: Vitals:   09/26/23 2015 09/26/23 2115  BP:    Pulse: 86 74  Resp:    Temp:    SpO2: 99% 98%   General: Awake, oriented x4. CV:  Good  peripheral perfusion. Resp:  Normal effort. Abd:  No distention. Other:  Cachectic middle-aged Caucasian female resting comfortably in no acute distress.  Hoarse voice ED Results / Procedures / Treatments  Labs (all labs ordered are listed, but only abnormal results are displayed) Labs Reviewed  COMPREHENSIVE METABOLIC PANEL WITH GFR - Abnormal; Notable for the following components:      Result Value   Chloride 97 (*)    CO2 35 (*)    BUN 26 (*)    Calcium 11.3 (*)    Albumin 3.1 (*)    All other components within normal limits  CBC - Abnormal; Notable for the following components:   RBC 5.43 (*)    HCT 46.5 (*)    MCH 25.6 (*)    MCHC 29.9 (*)    All other components within normal limits  URINALYSIS, ROUTINE W REFLEX MICROSCOPIC - Abnormal; Notable for the following components:   Color, Urine YELLOW (*)    APPearance CLOUDY (*)    Leukocytes,Ua LARGE (*)    Bacteria, UA RARE (*)    All other components within normal limits   RADIOLOGY ED MD interpretation: Single view  portable chest x-ray shows hilar mass measuring 8.5 x 4 cm - All radiology independently interpreted and agree with radiology assessment Official radiology report(s): DG Chest Port 1 View Result Date: 09/26/2023 CLINICAL DATA:  weakness, hx of throat mass.  Lung cancer. EXAM: PORTABLE CHEST 1 VIEW COMPARISON:  Chest x-ray 09/01/2023, PET CT 09/04/2023 FINDINGS: The heart and mediastinal contours are unchanged. Redemonstration of similar-appearing left hilar mass measuring approximately 8.5 x 4 cm. No focal consolidation. No pulmonary edema. No pleural effusion. No pneumothorax. No acute osseous abnormality. IMPRESSION: 1. No acute cardiopulmonary abnormality. 2. Redemonstration of similar-appearing left hilar mass measuring approximately 8.5 x 4 cm. Finding better evaluated on PET CT 09/04/2023 Electronically Signed   By: Morgane  Naveau M.D.   On: 09/26/2023 19:21   PROCEDURES: Critical Care performed:  No Procedures MEDICATIONS ORDERED IN ED: Medications  lactated ringers  bolus 1,000 mL (0 mLs Intravenous Stopped 09/26/23 1946)   IMPRESSION / MDM / ASSESSMENT AND PLAN / ED COURSE  I reviewed the triage vital signs and the nursing notes.                             The patient is on the cardiac monitor to evaluate for evidence of arrhythmia and/or significant heart rate changes. [name] presentation is most consistent with acute presentation with potential threat to life or bodily function. The patient is suffering from shortness of breath, but the immediate cause is not apparent.  Potential causes considered include, but are not limited to, asthma or COPD, congestive heart failure, pulmonary embolism, pneumothorax, coronary syndrome, pneumonia, and pleural effusion.  Patient was ambulated with desaturation to 87%.  I spoke to patient at length about this hypoxia likely leading to altered mental status that she has not perfusing her brain with oxygen adequately.  This patient has elected to leave against medical advice. In my opinion, the patient has capacity to leave AMA. The patient is clinically sober, free from distracting injury, appears to have intact insight and judgment and reason, and in my opinion has capacity to make decisions. I explained to the patient that his symptoms may represent intermittent hypoxia and the patient verbalized understanding of my concerns.  I had a discussion with the patient about their workup and results, and that they may still have intermittent hypoxia despite feeling better now. I informed the patient that the next step in diagnosis and treatment would be admission for home oxygen, and they verbalized understanding of this as well. I explained the risks of leaving without further workup or treatment, which included reasonably foreseeable complications such as death, serious injury, permanent disability, and worsening shortness of breath and altered mental  status. I also offered alternatives to departing AMA such as assigning the patient a different provider or an alternate workup pathway.  The patient is refusing any further care, specifically admission, and is leaving against medical advice. I am unable to convince the patient to stay. I have asked them to return as soon as possible to complete their evaluation, and also explained that they were welcome to return to the ER for further evaluation whenever they choose. I have asked the patient to follow up with their primary doctor as soon as possible. I have answered all their questions. Patient signed did sign AMA paperwork.   FINAL CLINICAL IMPRESSION(S) / ED DIAGNOSES   Final diagnoses:  Altered mental status, unspecified altered mental status type  Hypoxia   Rx / DC  Orders   ED Discharge Orders     None      Note:  This document was prepared using Dragon voice recognition software and may include unintentional dictation errors.   Trino Higinbotham K, MD 09/26/23 2250

## 2023-09-30 ENCOUNTER — Inpatient Hospital Stay
Admission: RE | Admit: 2023-09-30 | Discharge: 2023-09-30 | Disposition: A | Discharge status: Home / Self Care | Source: Ambulatory Visit

## 2023-09-30 HISTORY — DX: Depression, unspecified: F32.A

## 2023-09-30 HISTORY — DX: Psoriasis, unspecified: L40.9

## 2023-09-30 HISTORY — DX: Pneumonia, unspecified organism: J18.9

## 2023-09-30 HISTORY — DX: Unspecified protein-calorie malnutrition: E46

## 2023-09-30 HISTORY — DX: Neoplasm of unspecified behavior of respiratory system: D49.1

## 2023-09-30 HISTORY — DX: Cannabis use, unspecified, uncomplicated: F12.90

## 2023-09-30 HISTORY — DX: Hyperlipidemia, unspecified: E78.5

## 2023-09-30 HISTORY — DX: Nicotine dependence, unspecified, uncomplicated: F17.200

## 2023-09-30 HISTORY — DX: Other nonspecific abnormal finding of lung field: R91.8

## 2023-09-30 NOTE — Progress Notes (Signed)
 Called pt to do her anesthesia interview for upcoming 10-02-23 surgery. I had to leave a message. Called pts daughter [place] and she states that pt is sleeping. I informed [place] that I leave at 1700 and she said she will get her mom to call me back before I leave

## 2023-09-30 NOTE — Patient Instructions (Addendum)
 Your procedure is scheduled on:10-02-23 Wednesday Report to the Registration Desk on the 1st floor of the Medical Mall.Then proceed to the 2nd floor Surgery Desk To find out your arrival time, please call [phone] between 1PM - 3PM on:10-01-23 Tuesday If your arrival time is 6:00 am, do not arrive before that time as the [facility] entrance doors do not open until 6:00 am.  REMEMBER: Instructions that are not followed completely may result in serious medical risk, up to and including death; or upon the discretion of your surgeon and anesthesiologist your surgery may need to be rescheduled.  Do not eat food after midnight the night before surgery.  No gum chewing or hard candies.  You may however, drink CLEAR liquids up to 2 hours before you are scheduled to arrive for your surgery. Do not drink anything within 2 hours of your scheduled arrival time.  Clear liquids include: - water   - apple juice without pulp - gatorade (not RED colors) - black coffee or tea (Do NOT add milk or creamers to the coffee or tea) Do NOT drink anything that is not on this list.  One week prior to surgery:Stop NOW (09-30-23) Stop Anti-inflammatories (NSAIDS) such as Advil, Aleve, Ibuprofen, Motrin, Naproxen, Naprosyn and Aspirin based products such as Excedrin, Goody's Powder, BC Powder. Stop ANY OVER THE COUNTER supplements until after surgery.  You may however, continue to take Tylenol  if needed for pain up until the day of surgery.  Continue taking all of your other prescription medications up until the day of surgery.  ON THE DAY OF SURGERY ONLY TAKE THESE MEDICATIONS WITH SIPS OF WATER : -abacavir -dolutegravir -lamiVUDine  (TRIUMEQ)  -bictegravir-emtricitabine -tenofovir  AF (BIKTARVY )  -famotidine  (PEPCID )  -You may take Morphine  Sulfate (MORPHINE  CONCENTRATE) for pain if needed -You may take LORazepam  (ATIVAN ) if needed for anxiety  No Alcohol for 24 hours before or after surgery.  No Smoking  including e-cigarettes for 24 hours before surgery.  No chewable tobacco products for at least 6 hours before surgery.  No nicotine  patches on the day of surgery.  Do not use any recreational drugs for at least a week (preferably 2 weeks) before your surgery.  Please be advised that the combination of cocaine and anesthesia may have negative outcomes, up to and including death. If you test positive for cocaine, your surgery will be cancelled.  On the morning of surgery brush your teeth with toothpaste and water , you may rinse your mouth with mouthwash if you wish. Do not swallow any toothpaste or mouthwash.  Use CHG Soap as directed on instruction sheet.  Do not wear jewelry, make-up, hairpins, clips or nail polish.  For welded (permanent) jewelry: bracelets, anklets, waist bands, etc.  Please have this removed prior to surgery.  If it is not removed, there is a chance that hospital personnel will need to cut it off on the day of surgery.  Do not wear lotions, powders, or perfumes.   Do not shave body hair from the neck down 48 hours before surgery.  Contact lenses, hearing aids and dentures may not be worn into surgery.  Do not bring valuables to the hospital. [facility] is not responsible for any missing/lost belongings or valuables.   Notify your doctor if there is any change in your medical condition (cold, fever, infection).  Wear comfortable clothing (specific to your surgery type) to the hospital.  After surgery, you can help prevent lung complications by doing breathing exercises.  Take deep breaths and cough every 1-2 hours.  Your doctor may order a device called an Incentive Spirometer to help you take deep breaths. When coughing or sneezing, hold a pillow firmly against your incision with both hands. This is called "splinting." Doing this helps protect your incision. It also decreases belly discomfort.  If you are being admitted to the hospital overnight, leave your  suitcase in the car. After surgery it may be brought to your room.  In case of increased patient census, it may be necessary for you, the patient, to continue your postoperative care in the Same Day Surgery department.  If you are being discharged the day of surgery, you will not be allowed to drive home. You will need a responsible individual to drive you home and stay with you for 24 hours after surgery.   If you are taking public transportation, you will need to have a responsible individual with you.  Please call the Pre-admissions Testing Dept. at [phone] if you have any questions about these instructions.  Surgery Visitation Policy:  Patients having surgery or a procedure may have two visitors.  Children under the age of 65 must have an adult with them who is not the patient.  Inpatient Visitation:    Visiting hours are 7 a.m. to 8 p.m. Up to four visitors are allowed at one time in a patient room. The visitors may rotate out with other people during the day.  One visitor age 29 or older may stay with the patient overnight and must be in the room by 8 p.m.     Preparing for Surgery with CHLORHEXIDINE  GLUCONATE (CHG) Soap  Chlorhexidine  Gluconate (CHG) Soap  o An antiseptic cleaner that kills germs and bonds with the skin to continue killing germs even after washing  o Used for showering the night before surgery and morning of surgery  Before surgery, you can play an important role by reducing the number of germs on your skin.  CHG (Chlorhexidine  gluconate) soap is an antiseptic cleanser which kills germs and bonds with the skin to continue killing germs even after washing.  Please do not use if you have an allergy to CHG or antibacterial soaps. If your skin becomes reddened/irritated stop using the CHG.  1. Shower the NIGHT BEFORE SURGERY and the MORNING OF SURGERY with CHG soap.  2. If you choose to wash your hair, wash your hair first as usual with your normal  shampoo.  3. After shampooing, rinse your hair and body thoroughly to remove the shampoo.  4. Use CHG as you would any other liquid soap. You can apply CHG directly to the skin and wash gently with a scrungie or a clean washcloth.  5. Apply the CHG soap to your body only from the neck down. Do not use on open wounds or open sores. Avoid contact with your eyes, ears, mouth, and genitals (private parts). Wash face and genitals (private parts) with your normal soap.  6. Wash thoroughly, paying special attention to the area where your surgery will be performed.  7. Thoroughly rinse your body with warm water .  8. Do not shower/wash with your normal soap after using and rinsing off the CHG soap.  9. Pat yourself dry with a clean towel.  10. Wear clean pajamas to bed the night before surgery.  12. Place clean sheets on your bed the night of your first shower and do not sleep with pets.  13. Shower again with the CHG soap on the day of surgery prior to arriving at  the hospital.  14. Do not apply any deodorants/lotions/powders.  15. Please wear clean clothes to the hospital.   [employer] to address health-related social needs:  [email or web address]

## 2023-10-01 ENCOUNTER — Inpatient Hospital Stay

## 2023-10-01 ENCOUNTER — Other Ambulatory Visit: Payer: Self-pay | Admitting: Hospice and Palliative Medicine

## 2023-10-01 ENCOUNTER — Encounter: Payer: Self-pay | Admitting: Otolaryngology

## 2023-10-01 ENCOUNTER — Other Ambulatory Visit: Payer: Self-pay

## 2023-10-01 DIAGNOSIS — R918 Other nonspecific abnormal finding of lung field: Secondary | ICD-10-CM

## 2023-10-01 MED ORDER — ORAL CARE MOUTH RINSE: 15.0000 mL | Freq: Once | OROMUCOSAL | Status: DC

## 2023-10-01 MED ORDER — LACTATED RINGERS IV SOLN: INTRAVENOUS | Status: DC

## 2023-10-01 MED ORDER — SODIUM CHLORIDE 0.9 % IV SOLN
2.0000 g | INTRAVENOUS | Status: AC
  Administered 2023-10-02 (×2): 2 g via INTRAVENOUS

## 2023-10-01 MED ORDER — CHLORHEXIDINE GLUCONATE CLOTH 2 % EX PADS: 6.0000 | MEDICATED_PAD | Freq: Once | CUTANEOUS | Status: DC

## 2023-10-01 MED ORDER — CHLORHEXIDINE GLUCONATE 0.12 % MT SOLN: 15.0000 mL | Freq: Once | OROMUCOSAL | Status: DC

## 2023-10-01 MED ORDER — CHLORHEXIDINE GLUCONATE CLOTH 2 % EX PADS
6.0000 | MEDICATED_PAD | Freq: Once | CUTANEOUS | Status: AC
  Administered 2023-10-02 (×2): 6 via TOPICAL

## 2023-10-01 NOTE — Progress Notes (Signed)
 [facility] CSW Progress Note  Clinical Child psychotherapist contacted caregiver by phone to follow-up on need for community resources.    Interventions: Spoke with patient's daughter, [place].  She requested a prescription for a walker and a wheelchair.  Forwarded message to Dr. Jacobo and his RN, Marjorie.  Daughter stated patient can no longer walk independently.      Follow Up Plan:  CSW will follow-up with patient by phone     Macario CHRISTELLA Au, LCSW Clinical Social Worker [facility]    Patient is participating in a Managed Medicaid Plan:  Yes

## 2023-10-01 NOTE — Patient Instructions (Addendum)
 Your procedure is scheduled on:10-02-23 Wednesday Report to the Registration Desk on the 1st floor of the [facility].Then proceed to the 2nd floor Surgery Desk To find out your arrival time, please call [phone] between 1PM - 3PM on:10-01-23 Tuesday If your arrival time is 6:00 am, do not arrive before that time as the [facility] entrance doors do not open until 6:00 am.  REMEMBER: Instructions that are not followed completely may result in serious medical risk, up to and including death; or upon the discretion of your surgeon and anesthesiologist your surgery may need to be rescheduled.  Do not eat food after midnight the night before surgery.  No gum chewing or hard candies.  You may however, drink CLEAR liquids up to 2 hours before you are scheduled to arrive for your surgery. Do not drink anything within 2 hours of your scheduled arrival time.  Clear liquids include: - water   - apple juice without pulp - gatorade (not RED colors) - black coffee or tea (Do NOT add milk or creamers to the coffee or tea) Do NOT drink anything that is not on this list.  One week prior to surgery:Stop NOW (10-01-23) Stop Anti-inflammatories (NSAIDS) such as Advil, Aleve, Ibuprofen, Motrin, Naproxen, Naprosyn and Aspirin based products such as Excedrin, Goody's Powder, BC Powder. Stop ANY OVER THE COUNTER supplements until after surgery.  You may however, continue to take Tylenol  if needed for pain up until the day of surgery.  Continue taking all of your other prescription medications up until the day of surgery.  ON THE DAY OF SURGERY ONLY TAKE THESE MEDICATIONS WITH SIPS OF WATER : -You may take Morphine  Sulfate (MORPHINE  CONCENTRATE) if needed for pain -You may take LORazepam  (ATIVAN ) for anxiety if needed  Use your Albuterol  Nebulizer and Spiriva  and Advair Inhalers the day of surgery  No Alcohol for 24 hours before or after surgery.  No Smoking including e-cigarettes for 24 hours before  surgery.  No chewable tobacco products for at least 6 hours before surgery.  No nicotine  patches on the day of surgery.  Do not use any recreational drugs for at least a week (preferably 2 weeks) before your surgery.  Please be advised that the combination of cocaine and anesthesia may have negative outcomes, up to and including death. If you test positive for cocaine, your surgery will be cancelled.  On the morning of surgery brush your teeth with toothpaste and water , you may rinse your mouth with mouthwash if you wish. Do not swallow any toothpaste or mouthwash.  Use CHG wipes as directed on instruction sheet.  Do not wear jewelry, make-up, hairpins, clips or nail polish.  For welded (permanent) jewelry: bracelets, anklets, waist bands, etc.  Please have this removed prior to surgery.  If it is not removed, there is a chance that hospital personnel will need to cut it off on the day of surgery.  Do not wear lotions, powders, or perfumes.   Do not shave body hair from the neck down 48 hours before surgery.  Contact lenses, hearing aids and dentures may not be worn into surgery.  Do not bring valuables to the hospital. [facility] is not responsible for any missing/lost belongings or valuables.   Notify your doctor if there is any change in your medical condition (cold, fever, infection).  Wear comfortable clothing (specific to your surgery type) to the hospital.  After surgery, you can help prevent lung complications by doing breathing exercises.  Take deep breaths and  cough every 1-2 hours. Your doctor may order a device called an Incentive Spirometer to help you take deep breaths. When coughing or sneezing, hold a pillow firmly against your incision with both hands. This is called "splinting." Doing this helps protect your incision. It also decreases belly discomfort.  If you are being admitted to the hospital overnight, leave your suitcase in the car. After surgery it may be  brought to your room.  In case of increased patient census, it may be necessary for you, the patient, to continue your postoperative care in the Same Day Surgery department.  If you are being discharged the day of surgery, you will not be allowed to drive home. You will need a responsible individual to drive you home and stay with you for 24 hours after surgery.   If you are taking public transportation, you will need to have a responsible individual with you.  Please call the Pre-admissions Testing Dept. at [phone] if you have any questions about these instructions.  Surgery Visitation Policy:  Patients having surgery or a procedure may have two visitors.  Children under the age of 29 must have an adult with them who is not the patient.  Inpatient Visitation:    Visiting hours are 7 a.m. to 8 p.m. Up to four visitors are allowed at one time in a patient room. The visitors may rotate out with other people during the day.  One visitor age 63 or older may stay with the patient overnight and must be in the room by 8 p.m.  Preparing the Skin Before Surgery     To help prevent the risk of infection at your surgical site, we are now providing you with rinse-free Sage 2% Chlorhexidine  Gluconate (CHG) disposable wipes.  Chlorhexidine  Gluconate (CHG) Soap  o An antiseptic cleaner that kills germs and bonds with the skin to continue killing germs even after washing  o Used for showering the night before surgery and morning of surgery  The night before surgery: Shower or bathe with warm water . Do not apply perfume, lotions, powders. Wait one hour after shower. Skin should be dry and cool. Open Sage wipe package - use 6 disposable cloths. Wipe body using one cloth for the right arm, one cloth for the left arm, one cloth for the right leg, one cloth for the left leg, one cloth for the chest/abdomen area, and one cloth for the back. Do not use on open wounds or sores. Do not use on  face or genitals (private parts). If you are breast feeding, do not use on breasts. 5. Do not rinse, allow to dry. 6. Skin may feel tacky for several minutes. 7. Dress in clean clothes. 8. Place clean sheets on your bed and do not sleep with pets.  REPEAT ABOVE ON THE MORNING OF SURGERY BEFORE ARRIVING TO THE HOSPITAL.   Merchandiser, retail to address health-related social needs:  [email or web address]

## 2023-10-01 NOTE — Progress Notes (Signed)
 DME: walker and wheelchair

## 2023-10-02 ENCOUNTER — Ambulatory Visit: Payer: Self-pay | Admitting: Urgent Care

## 2023-10-02 ENCOUNTER — Inpatient Hospital Stay: Admission: RE | Admit: 2023-10-02 | Source: Ambulatory Visit

## 2023-10-02 ENCOUNTER — Inpatient Hospital Stay

## 2023-10-02 ENCOUNTER — Other Ambulatory Visit: Payer: Self-pay

## 2023-10-02 ENCOUNTER — Inpatient Hospital Stay
Admission: RE | Admit: 2023-10-02 | Discharge: 2023-10-08 | DRG: 011 | Disposition: A | Discharge status: Hospice | Attending: Internal Medicine | Admitting: Internal Medicine

## 2023-10-02 ENCOUNTER — Encounter
Admission: RE | Disposition: A | Discharge status: Hospice | Payer: Self-pay | Source: Home / Self Care | Attending: Internal Medicine

## 2023-10-02 ENCOUNTER — Ambulatory Visit

## 2023-10-02 ENCOUNTER — Ambulatory Visit: Payer: Self-pay

## 2023-10-02 ENCOUNTER — Encounter: Payer: Self-pay | Admitting: Otolaryngology

## 2023-10-02 DIAGNOSIS — Z66 Do not resuscitate: Secondary | ICD-10-CM | POA: Diagnosis present

## 2023-10-02 DIAGNOSIS — Z880 Allergy status to penicillin: Secondary | ICD-10-CM

## 2023-10-02 DIAGNOSIS — F32A Depression, unspecified: Secondary | ICD-10-CM | POA: Diagnosis present

## 2023-10-02 DIAGNOSIS — R54 Age-related physical debility: Secondary | ICD-10-CM | POA: Diagnosis present

## 2023-10-02 DIAGNOSIS — J449 Chronic obstructive pulmonary disease, unspecified: Secondary | ICD-10-CM | POA: Diagnosis present

## 2023-10-02 DIAGNOSIS — F419 Anxiety disorder, unspecified: Secondary | ICD-10-CM | POA: Diagnosis present

## 2023-10-02 DIAGNOSIS — D630 Anemia in neoplastic disease: Secondary | ICD-10-CM | POA: Diagnosis present

## 2023-10-02 DIAGNOSIS — Z515 Encounter for palliative care: Secondary | ICD-10-CM | POA: Diagnosis not present

## 2023-10-02 DIAGNOSIS — M6281 Muscle weakness (generalized): Secondary | ICD-10-CM | POA: Diagnosis not present

## 2023-10-02 DIAGNOSIS — Z5986 Financial insecurity: Secondary | ICD-10-CM

## 2023-10-02 DIAGNOSIS — W44F9XA Other object of natural or organic material, entering into or through a natural orifice, initial encounter: Secondary | ICD-10-CM | POA: Diagnosis present

## 2023-10-02 DIAGNOSIS — L89153 Pressure ulcer of sacral region, stage 3: Secondary | ICD-10-CM | POA: Diagnosis present

## 2023-10-02 DIAGNOSIS — D649 Anemia, unspecified: Secondary | ICD-10-CM | POA: Diagnosis not present

## 2023-10-02 DIAGNOSIS — E785 Hyperlipidemia, unspecified: Secondary | ICD-10-CM | POA: Diagnosis present

## 2023-10-02 DIAGNOSIS — Z886 Allergy status to analgesic agent status: Secondary | ICD-10-CM | POA: Diagnosis not present

## 2023-10-02 DIAGNOSIS — Z681 Body mass index (BMI) 19 or less, adult: Secondary | ICD-10-CM

## 2023-10-02 DIAGNOSIS — Z8614 Personal history of Methicillin resistant Staphylococcus aureus infection: Secondary | ICD-10-CM

## 2023-10-02 DIAGNOSIS — R1310 Dysphagia, unspecified: Secondary | ICD-10-CM | POA: Diagnosis present

## 2023-10-02 DIAGNOSIS — C7802 Secondary malignant neoplasm of left lung: Secondary | ICD-10-CM | POA: Diagnosis present

## 2023-10-02 DIAGNOSIS — Z91148 Patient's other noncompliance with medication regimen for other reason: Secondary | ICD-10-CM

## 2023-10-02 DIAGNOSIS — Z789 Other specified health status: Secondary | ICD-10-CM | POA: Diagnosis not present

## 2023-10-02 DIAGNOSIS — R64 Cachexia: Secondary | ICD-10-CM | POA: Diagnosis present

## 2023-10-02 DIAGNOSIS — T17590A Other foreign object in bronchus causing asphyxiation, initial encounter: Secondary | ICD-10-CM | POA: Diagnosis present

## 2023-10-02 DIAGNOSIS — B20 Human immunodeficiency virus [HIV] disease: Secondary | ICD-10-CM | POA: Diagnosis present

## 2023-10-02 DIAGNOSIS — D380 Neoplasm of uncertain behavior of larynx: Secondary | ICD-10-CM | POA: Diagnosis present

## 2023-10-02 DIAGNOSIS — I10 Essential (primary) hypertension: Secondary | ICD-10-CM | POA: Diagnosis present

## 2023-10-02 DIAGNOSIS — R918 Other nonspecific abnormal finding of lung field: Secondary | ICD-10-CM

## 2023-10-02 DIAGNOSIS — C760 Malignant neoplasm of head, face and neck: Secondary | ICD-10-CM | POA: Diagnosis not present

## 2023-10-02 DIAGNOSIS — Z8673 Personal history of transient ischemic attack (TIA), and cerebral infarction without residual deficits: Secondary | ICD-10-CM

## 2023-10-02 DIAGNOSIS — F4310 Post-traumatic stress disorder, unspecified: Secondary | ICD-10-CM | POA: Diagnosis present

## 2023-10-02 DIAGNOSIS — Z5982 Transportation insecurity: Secondary | ICD-10-CM

## 2023-10-02 DIAGNOSIS — Z7189 Other specified counseling: Secondary | ICD-10-CM | POA: Diagnosis not present

## 2023-10-02 DIAGNOSIS — R262 Difficulty in walking, not elsewhere classified: Secondary | ICD-10-CM | POA: Diagnosis not present

## 2023-10-02 DIAGNOSIS — D529 Folate deficiency anemia, unspecified: Secondary | ICD-10-CM | POA: Diagnosis present

## 2023-10-02 DIAGNOSIS — Z820 Family history of epilepsy and other diseases of the nervous system: Secondary | ICD-10-CM

## 2023-10-02 DIAGNOSIS — B963 Hemophilus influenzae [H. influenzae] as the cause of diseases classified elsewhere: Secondary | ICD-10-CM | POA: Diagnosis present

## 2023-10-02 DIAGNOSIS — C3412 Malignant neoplasm of upper lobe, left bronchus or lung: Secondary | ICD-10-CM | POA: Diagnosis not present

## 2023-10-02 DIAGNOSIS — Z8249 Family history of ischemic heart disease and other diseases of the circulatory system: Secondary | ICD-10-CM

## 2023-10-02 DIAGNOSIS — F17210 Nicotine dependence, cigarettes, uncomplicated: Secondary | ICD-10-CM | POA: Diagnosis present

## 2023-10-02 DIAGNOSIS — E43 Unspecified severe protein-calorie malnutrition: Secondary | ICD-10-CM | POA: Diagnosis present

## 2023-10-02 DIAGNOSIS — F17290 Nicotine dependence, other tobacco product, uncomplicated: Secondary | ICD-10-CM | POA: Diagnosis present

## 2023-10-02 DIAGNOSIS — Z7951 Long term (current) use of inhaled steroids: Secondary | ICD-10-CM

## 2023-10-02 DIAGNOSIS — C329 Malignant neoplasm of larynx, unspecified: Secondary | ICD-10-CM | POA: Diagnosis not present

## 2023-10-02 DIAGNOSIS — Z79899 Other long term (current) drug therapy: Secondary | ICD-10-CM

## 2023-10-02 DIAGNOSIS — L89156 Pressure-induced deep tissue damage of sacral region: Secondary | ICD-10-CM | POA: Diagnosis present

## 2023-10-02 DIAGNOSIS — Z98891 History of uterine scar from previous surgery: Secondary | ICD-10-CM

## 2023-10-02 DIAGNOSIS — K219 Gastro-esophageal reflux disease without esophagitis: Secondary | ICD-10-CM | POA: Diagnosis present

## 2023-10-02 HISTORY — DX: Tobacco use: Z72.0

## 2023-10-02 HISTORY — PX: CREATION, GASTROSTOMY, OPEN: SHX7546

## 2023-10-02 HISTORY — PX: PORTACATH PLACEMENT: SHX2246

## 2023-10-02 HISTORY — PX: TRACHEOSTOMY TUBE PLACEMENT: SHX814

## 2023-10-02 HISTORY — DX: Anemia, unspecified: D64.9

## 2023-10-02 LAB — RENAL FUNCTION PANEL
Albumin: 2.7 g/dL — ABNORMAL LOW (ref 3.5–5.0)
Anion gap: 9 (ref 5–15)
BUN: 34 mg/dL — ABNORMAL HIGH (ref 6–20)
CO2: 34 mmol/L — ABNORMAL HIGH (ref 22–32)
Calcium: 11.8 mg/dL — ABNORMAL HIGH (ref 8.9–10.3)
Chloride: 101 mmol/L (ref 98–111)
Creatinine, Ser: 0.99 mg/dL (ref 0.44–1.00)
GFR, Estimated: >60 mL/min (ref >60)
Glucose, Bld: 112 mg/dL — ABNORMAL HIGH (ref 70–99)
Phosphorus: 5 mg/dL — ABNORMAL HIGH (ref 2.5–4.6)
Potassium: 4.8 mmol/L (ref 3.5–5.1)
Sodium: 144 mmol/L (ref 135–145)

## 2023-10-02 LAB — CBC
HCT: 43.5 % (ref 36.0–46.0)
Hemoglobin: 13.3 g/dL (ref 12.0–15.0)
MCH: 26.1 pg (ref 26.0–34.0)
MCHC: 30.6 g/dL (ref 30.0–36.0)
MCV: 85.5 fL (ref 80.0–100.0)
Platelets: 172 K/uL (ref 150–400)
RBC: 5.09 MIL/uL (ref 3.87–5.11)
RDW: 14.6 % (ref 11.5–15.5)
WBC: 5.9 K/uL (ref 4.0–10.5)
nRBC: 0 % (ref 0.0–0.2)

## 2023-10-02 LAB — GLUCOSE, CAPILLARY
Glucose-Capillary: 104 mg/dL — ABNORMAL HIGH (ref 70–99)
Glucose-Capillary: 91 mg/dL (ref 70–99)

## 2023-10-02 LAB — MRSA NEXT GEN BY PCR, NASAL: MRSA by PCR Next Gen: NOT DETECTED

## 2023-10-02 SURGERY — CREATION, TRACHEOSTOMY
Anesthesia: General | Site: Neck

## 2023-10-02 MED ORDER — BUPIVACAINE-EPINEPHRINE (PF) 0.25% -1:200000 IJ SOLN
INTRAMUSCULAR | Status: AC
  Filled 2023-10-02: qty 60

## 2023-10-02 MED ORDER — MIDAZOLAM HCL 2 MG/2ML IJ SOLN
INTRAMUSCULAR | Status: AC
  Filled 2023-10-02: qty 2

## 2023-10-02 MED ORDER — ACETAMINOPHEN 325 MG PO TABS: 650.0000 mg | ORAL_TABLET | Freq: Four times a day (QID) | ORAL | Status: DC | PRN

## 2023-10-02 MED ORDER — PROPOFOL 1000 MG/100ML IV EMUL
INTRAVENOUS | Status: AC
  Filled 2023-10-02: qty 100

## 2023-10-02 MED ORDER — FENTANYL CITRATE (PF) 100 MCG/2ML IJ SOLN
INTRAMUSCULAR | Status: AC
  Filled 2023-10-02: qty 2

## 2023-10-02 MED ORDER — LIDOCAINE HCL (PF) 1 % IJ SOLN
INTRAMUSCULAR | Status: AC
  Filled 2023-10-02: qty 30

## 2023-10-02 MED ORDER — POLYETHYLENE GLYCOL 3350 17 G PO PACK: 17.0000 g | PACK | Freq: Every day | ORAL | Status: DC | PRN

## 2023-10-02 MED ORDER — SODIUM CHLORIDE (PF) 0.9 % IJ SOLN
INTRAMUSCULAR | Status: AC
  Filled 2023-10-02: qty 10

## 2023-10-02 MED ORDER — FENTANYL CITRATE PF 50 MCG/ML IJ SOSY
25.0000 ug | PREFILLED_SYRINGE | INTRAMUSCULAR | Status: DC | PRN
  Administered 2023-10-02 (×2): 25 ug via INTRAVENOUS
  Filled 2023-10-02 (×2): qty 1

## 2023-10-02 MED ORDER — PANTOPRAZOLE SODIUM 40 MG IV SOLR
40.0000 mg | INTRAVENOUS | Status: DC
  Administered 2023-10-02 – 2023-10-07 (×7): 40 mg via INTRAVENOUS
  Filled 2023-10-02 (×6): qty 10

## 2023-10-02 MED ORDER — LIDOCAINE-EPINEPHRINE 1 %-1:100000 IJ SOLN
INTRAMUSCULAR | Status: DC | PRN
Start: 2023-10-02 — End: 2023-10-02
  Administered 2023-10-02 (×2): 7 mL

## 2023-10-02 MED ORDER — LACTATED RINGERS IV SOLN: INTRAVENOUS | Status: DC

## 2023-10-02 MED ORDER — PHENYLEPHRINE 80 MCG/ML (10ML) SYRINGE FOR IV PUSH (FOR BLOOD PRESSURE SUPPORT)
PREFILLED_SYRINGE | INTRAVENOUS | Status: DC | PRN
Start: 2023-10-02 — End: 2023-10-02
  Administered 2023-10-02: 80 ug via INTRAVENOUS
  Administered 2023-10-02 (×2): 160 ug via INTRAVENOUS
  Administered 2023-10-02: 80 ug via INTRAVENOUS
  Administered 2023-10-02 (×2): 160 ug via INTRAVENOUS

## 2023-10-02 MED ORDER — VASOPRESSIN 20 UNIT/ML IV SOLN
INTRAVENOUS | Status: DC | PRN
  Administered 2023-10-02 (×2): 4 [IU] via INTRAVENOUS

## 2023-10-02 MED ORDER — HEPARIN SODIUM (PORCINE) 5000 UNIT/ML IJ SOLN
INTRAMUSCULAR | Status: AC
  Filled 2023-10-02: qty 1

## 2023-10-02 MED ORDER — EPHEDRINE SULFATE-NACL 50-0.9 MG/10ML-% IV SOSY
PREFILLED_SYRINGE | INTRAVENOUS | Status: DC | PRN
  Administered 2023-10-02 (×2): 5 mg via INTRAVENOUS

## 2023-10-02 MED ORDER — DIAZEPAM 5 MG/ML IJ SOLN: 2.5000 mg | Freq: Three times a day (TID) | INTRAMUSCULAR | Status: DC | PRN

## 2023-10-02 MED ORDER — SODIUM CHLORIDE (PF) 0.9 % IJ SOLN
INTRAMUSCULAR | Status: DC | PRN
  Administered 2023-10-02 (×2): 4 mL via INTRAVENOUS

## 2023-10-02 MED ORDER — BUPIVACAINE-EPINEPHRINE 0.25% -1:200000 IJ SOLN
INTRAMUSCULAR | Status: DC | PRN
  Administered 2023-10-02 (×2): 30 mL

## 2023-10-02 MED ORDER — 0.9 % SODIUM CHLORIDE (POUR BTL) OPTIME
TOPICAL | Status: DC | PRN
  Administered 2023-10-02 (×2): 500 mL

## 2023-10-02 MED ORDER — CEFOTETAN DISODIUM 2 G IJ SOLR
2.0000 g | Freq: Two times a day (BID) | INTRAMUSCULAR | Status: AC
  Administered 2023-10-03 (×2): 2 g via INTRAVENOUS
  Filled 2023-10-02 (×4): qty 2

## 2023-10-02 MED ORDER — UMECLIDINIUM BROMIDE 62.5 MCG/ACT IN AEPB
1.0000 | INHALATION_SPRAY | Freq: Every day | RESPIRATORY_TRACT | Status: DC
  Administered 2023-10-02 – 2023-10-08 (×8): 1 via RESPIRATORY_TRACT
  Filled 2023-10-02: qty 7

## 2023-10-02 MED ORDER — FLUTICASONE FUROATE-VILANTEROL 200-25 MCG/ACT IN AEPB
1.0000 | INHALATION_SPRAY | Freq: Every day | RESPIRATORY_TRACT | Status: DC
  Administered 2023-10-02 – 2023-10-08 (×8): 1 via RESPIRATORY_TRACT
  Filled 2023-10-02: qty 28

## 2023-10-02 MED ORDER — ONDANSETRON HCL 4 MG/2ML IJ SOLN: 4.0000 mg | Freq: Four times a day (QID) | INTRAMUSCULAR | Status: DC | PRN

## 2023-10-02 MED ORDER — LIDOCAINE-EPINEPHRINE 1 %-1:100000 IJ SOLN
INTRAMUSCULAR | Status: AC
Start: 2023-10-02 — End: 2023-10-02
  Filled 2023-10-02: qty 1

## 2023-10-02 MED ORDER — SUGAMMADEX SODIUM 200 MG/2ML IV SOLN
INTRAVENOUS | Status: DC | PRN
  Administered 2023-10-02 (×2): 120 mg via INTRAVENOUS

## 2023-10-02 MED ORDER — HEPARIN SODIUM (PORCINE) 5000 UNIT/ML IJ SOLN
INTRAMUSCULAR | Status: DC | PRN
  Administered 2023-10-02 (×2): 5000 [IU] via INTRAVENOUS

## 2023-10-02 MED ORDER — KETOROLAC TROMETHAMINE 15 MG/ML IJ SOLN
15.0000 mg | Freq: Four times a day (QID) | INTRAMUSCULAR | Status: AC
  Administered 2023-10-02 – 2023-10-07 (×21): 15 mg via INTRAVENOUS
  Filled 2023-10-02 (×20): qty 1

## 2023-10-02 MED ORDER — ROCURONIUM BROMIDE 100 MG/10ML IV SOLN
INTRAVENOUS | Status: DC | PRN
  Administered 2023-10-02: 10 mg via INTRAVENOUS
  Administered 2023-10-02 (×2): 5 mg via INTRAVENOUS
  Administered 2023-10-02 (×2): 10 mg via INTRAVENOUS
  Administered 2023-10-02 (×2): 20 mg via INTRAVENOUS
  Administered 2023-10-02: 10 mg via INTRAVENOUS

## 2023-10-02 MED ORDER — ONDANSETRON HCL 4 MG/2ML IJ SOLN
INTRAMUSCULAR | Status: DC | PRN
  Administered 2023-10-02 (×2): 4 mg via INTRAVENOUS

## 2023-10-02 MED ORDER — KETAMINE HCL 10 MG/ML IJ SOLN
INTRAMUSCULAR | Status: DC | PRN
  Administered 2023-10-02 (×2): 5 mg via INTRAVENOUS

## 2023-10-02 MED ORDER — PROPOFOL 10 MG/ML IV BOLUS
INTRAVENOUS | Status: DC | PRN
  Administered 2023-10-02 (×2): 20 mg via INTRAVENOUS

## 2023-10-02 MED ORDER — CARMEX CLASSIC LIP BALM EX OINT
1.0000 | TOPICAL_OINTMENT | CUTANEOUS | Status: DC | PRN
  Filled 2023-10-02: qty 0.1

## 2023-10-02 MED ORDER — MIDAZOLAM HCL 2 MG/2ML IJ SOLN
INTRAMUSCULAR | Status: DC | PRN
  Administered 2023-10-02 (×4): .5 mg via INTRAVENOUS

## 2023-10-02 MED ORDER — SODIUM CHLORIDE 0.9 % IV SOLN
INTRAVENOUS | Status: AC
  Filled 2023-10-02: qty 2

## 2023-10-02 MED ORDER — DEXMEDETOMIDINE HCL IN NACL 200 MCG/50ML IV SOLN
0.0000 ug/kg/h | INTRAVENOUS | Status: DC
  Administered 2023-10-02 (×2): .4 ug/kg/h via INTRAVENOUS
  Filled 2023-10-02: qty 50

## 2023-10-02 MED ORDER — GLYCOPYRROLATE 0.2 MG/ML IJ SOLN
INTRAMUSCULAR | Status: DC | PRN
  Administered 2023-10-02 (×2): .2 mg via INTRAVENOUS

## 2023-10-02 MED ORDER — PHENYLEPHRINE HCL-NACL 20-0.9 MG/250ML-% IV SOLN
INTRAVENOUS | Status: DC | PRN
  Administered 2023-10-02 (×2): 40 ug/min via INTRAVENOUS

## 2023-10-02 MED ORDER — IPRATROPIUM-ALBUTEROL 0.5-2.5 (3) MG/3ML IN SOLN: 3.0000 mL | RESPIRATORY_TRACT | Status: DC | PRN

## 2023-10-02 MED ORDER — ESMOLOL HCL 100 MG/10ML IV SOLN
INTRAVENOUS | Status: DC | PRN
Start: 2023-10-02 — End: 2023-10-02
  Administered 2023-10-02 (×2): 10 mg via INTRAVENOUS

## 2023-10-02 MED ORDER — DEXAMETHASONE SODIUM PHOSPHATE 10 MG/ML IJ SOLN
INTRAMUSCULAR | Status: DC | PRN
  Administered 2023-10-02 (×2): 10 mg via INTRAVENOUS

## 2023-10-02 MED ORDER — LORAZEPAM 2 MG/ML IJ SOLN: 0.5000 mg | Freq: Four times a day (QID) | INTRAMUSCULAR | Status: DC | PRN

## 2023-10-02 MED ORDER — DOCUSATE SODIUM 50 MG/5ML PO LIQD: 100.0000 mg | Freq: Two times a day (BID) | ORAL | Status: DC | PRN

## 2023-10-02 MED ORDER — ACETAMINOPHEN 10 MG/ML IV SOLN
1000.0000 mg | Freq: Four times a day (QID) | INTRAVENOUS | Status: AC
  Administered 2023-10-02 – 2023-10-03 (×5): 1000 mg via INTRAVENOUS
  Filled 2023-10-02 (×4): qty 100

## 2023-10-02 MED ORDER — KETAMINE HCL 50 MG/5ML IJ SOSY
PREFILLED_SYRINGE | INTRAMUSCULAR | Status: AC
  Filled 2023-10-02: qty 5

## 2023-10-02 MED ORDER — CHLORHEXIDINE GLUCONATE 0.12 % MT SOLN
OROMUCOSAL | Status: AC
  Filled 2023-10-02: qty 15

## 2023-10-02 MED ORDER — DEXMEDETOMIDINE HCL IN NACL 80 MCG/20ML IV SOLN
INTRAVENOUS | Status: DC | PRN
  Administered 2023-10-02 (×4): 4 ug via INTRAVENOUS

## 2023-10-02 SURGICAL SUPPLY — 66 items
BAG DECANTER FOR FLEXI CONT (MISCELLANEOUS) ×6 IMPLANT
BLADE SURG 15 STRL LF DISP TIS (BLADE) ×6 IMPLANT
BLADE SURG SZ11 CARB STEEL (BLADE) ×6 IMPLANT
CHLORAPREP W/TINT 26 (MISCELLANEOUS) ×3 IMPLANT
CLAMP SUTURE YELLOW 5 PAIRS (MISCELLANEOUS) ×3 IMPLANT
DERMABOND ADVANCED .7 DNX12 (GAUZE/BANDAGES/DRESSINGS) ×3 IMPLANT
DRAPE C-ARM XRAY 36X54 (DRAPES) ×6 IMPLANT
DRAPE INCISE IOBAN 66X45 STRL (DRAPES) ×3 IMPLANT
DRAPE LAPAROTOMY 100X77 ABD (DRAPES) ×3 IMPLANT
DRAPE SHEET LG 3/4 BI-LAMINATE (DRAPES) ×3 IMPLANT
DRSG TELFA 3X4 N-ADH STERILE (GAUZE/BANDAGES/DRESSINGS) IMPLANT
ELECT CAUTERY BLADE 6.4 (BLADE) ×3 IMPLANT
ELECTRODE CAUTERY BLDE TIP 2.5 (TIP) ×3 IMPLANT
ELECTRODE REM PT RTRN 9FT ADLT (ELECTROSURGICAL) ×6 IMPLANT
G-TUBE MIC 18FR ENFIT ADLT (TUBING) IMPLANT
GAUZE 4X4 16PLY ~~LOC~~+RFID DBL (SPONGE) ×3 IMPLANT
GAUZE SPONGE 4X4 12PLY STRL (GAUZE/BANDAGES/DRESSINGS) ×3 IMPLANT
GEL ULTRASOUND 20GR AQUASONIC (MISCELLANEOUS) ×3 IMPLANT
GLOVE BIO SURGEON STRL SZ7 (GLOVE) ×3 IMPLANT
GLOVE BIO SURGEON STRL SZ7.5 (GLOVE) ×3 IMPLANT
GOWN STRL REUS W/ TWL LRG LVL3 (GOWN DISPOSABLE) ×12 IMPLANT
HEMOSTAT SURGICEL 2X3 (HEMOSTASIS) IMPLANT
HLDR TRACH TUBE NECKBAND 18 (MISCELLANEOUS) ×3 IMPLANT
IV NS 500ML BAXH (IV SOLUTION) ×3 IMPLANT
KIT PORT INFUSION SMART 8FR (Port) ×3 IMPLANT
KIT TURNOVER KIT A (KITS) ×3 IMPLANT
LABEL OR SOLS (LABEL) ×6 IMPLANT
MANIFOLD NEPTUNE II (INSTRUMENTS) ×6 IMPLANT
NDL HYPO 22X1.5 SAFETY MO (MISCELLANEOUS) ×3 IMPLANT
NEEDLE HYPO 22X1.5 SAFETY MO (MISCELLANEOUS) ×6 IMPLANT
NS IRRIG 1000ML POUR BTL (IV SOLUTION) ×3 IMPLANT
NS IRRIG 500ML POUR BTL (IV SOLUTION) ×3 IMPLANT
PACK BASIN MINOR ARMC (MISCELLANEOUS) ×3 IMPLANT
PACK HEAD/NECK (MISCELLANEOUS) ×3 IMPLANT
PACK PORT-A-CATH (MISCELLANEOUS) ×3 IMPLANT
PAD MAGNETIC INSTR ST 16X20 (MISCELLANEOUS) ×3 IMPLANT
PLUG CATH AND CAP STRL 200 (CATHETERS) IMPLANT
SHEARS HARMONIC 9CM CVD (BLADE) ×3 IMPLANT
SOL PREP POV-IOD 4OZ 10% (MISCELLANEOUS) ×3 IMPLANT
SPONGE DRAIN TRACH 4X4 STRL 2S (GAUZE/BANDAGES/DRESSINGS) ×3 IMPLANT
SPONGE KITTNER 5P (MISCELLANEOUS) ×3 IMPLANT
SPONGE T-LAP 18X18 ~~LOC~~+RFID (SPONGE) ×3 IMPLANT
STAPLER SKIN PROX 35W (STAPLE) IMPLANT
SUCTION TUBE FRAZIER 10FR DISP (SUCTIONS) IMPLANT
SUT ETHILON 2 0 FS 18 (SUTURE) ×3 IMPLANT
SUT MNCRL AB 4-0 PS2 18 (SUTURE) ×3 IMPLANT
SUT PDS AB 1 TP1 96 (SUTURE) IMPLANT
SUT PDS PLUS AB 0 CT-2 (SUTURE) ×3 IMPLANT
SUT SILK 2 0 SH (SUTURE) ×6 IMPLANT
SUT SILK 2 0 SH CR/8 (SUTURE) IMPLANT
SUT SILK 2-0 18XBRD TIE 12 (SUTURE) ×3 IMPLANT
SUT VIC AB 3-0 PS2 18XBRD (SUTURE) IMPLANT
SUT VIC AB 3-0 SH 27X BRD (SUTURE) ×3 IMPLANT
SUTURE EHLN 3-0 FS-10 30 BLK (SUTURE) IMPLANT
SUTURE MNCRL 4-0 27XMF (SUTURE) IMPLANT
SUTURE PROLEN 2-0 RB1 36X2 ARM (SUTURE) ×3 IMPLANT
SYR 10ML LL (SYRINGE) ×6 IMPLANT
SYR 20ML LL LF (SYRINGE) ×3 IMPLANT
SYR 30ML LL (SYRINGE) ×3 IMPLANT
SYR 5ML LL (SYRINGE) ×3 IMPLANT
SYR TOOMEY 50ML (SYRINGE) ×3 IMPLANT
TOWEL OR 17X26 4PK STRL BLUE (TOWEL DISPOSABLE) ×3 IMPLANT
TRAP FLUID SMOKE EVACUATOR (MISCELLANEOUS) ×6 IMPLANT
TUBE TRACH 6.0 CUFF FLEX (MISCELLANEOUS) IMPLANT
TUBE TRACH FLEX 8.5 CUFF (MISCELLANEOUS) IMPLANT
WATER STERILE IRR 500ML POUR (IV SOLUTION) ×6 IMPLANT

## 2023-10-02 NOTE — Op Note (Addendum)
  Pre-operative Diagnosis: Metastatic head and neck CA, severe malnutrition  Post-operative Diagnosis: same   Surgeon: Laneta Luna, MD FACS  Anesthesia: GETA  Procedure:  Left IJ  Port placement with fluoroscopy under U/S guidance Open gastrostomy # 18 FR  Findings: Good position of the tip of the catheter by fluoroscopy G tube intraluminal LArge right neck mass  Estimated Blood Loss: 10cc         Drains: None         Specimens: None       Complications: none         Procedure Details  The patient was seen again in the Holding Room. The benefits, complications, treatment options, and expected outcomes were discussed with the patient. The risks of bleeding, infection, recurrence of symptoms, failure to resolve symptoms,  thrombosis nonfunction breakage pneumothorax hemopneumothorax any of which could require chest tube or further surgery were reviewed with the patient.   The patient was taken to Operating Room, identified as Whitney [name] and the procedure verified.  A Time Out was held and the above information confirmed.  Prior to the induction of general anesthesia, antibiotic prophylaxis was administered. VTE prophylaxis was in place. Appropriate anesthesia was then administered and tolerated well. The chest was prepped with Chloraprep and draped in the sterile fashion. The patient was positioned in the supine position. Then the patient was placed in Trendelenburg position.  Patient was prepped and draped in sterile fashion and in a Trendelenburg position local anesthetic was infiltrated into the skin and subcutaneous tissues in the neck and anterior chest wall. The large bore needle was placed into the Left  internal jugular vein under U/S guidance without difficulty and then the Seldinger wire was advanced. Fluoroscopy was utilized to confirm that the Seldinger wire was in the superior vena cava.  An incision was made and a port pocket developed with blunt and  electrocautery dissection. The introducer dilator was placed over the Seldinger wire the wire was removed. The previously flushed catheter was placed into the introducer dilator and the peel-away sheath was removed. The catheter length was confirmed and trimmed utilizing fluoroscopy for proper positioning. The catheter was then attached to the previously flushed port. The port was placed into the pocket. The port was held in with 2-0 Prolenes and flushed for function and heparin  locked.  The wound was closed with interrupted 3-0 Vicryl followed by 4-0 subcuticular Monocryl sutures. Dermabond used to coat the skin  Attention turned to the abdominal wall, mini midline  laparotomy incision created w 15 balde knife and fascia incised. Peritoneum elevated and incised with metz. NO injuries observed. THe body of the stomach was identified and held with [place], 2 purse string were placed and gastrotomy performed with electrocautery.  ABdominal wall incision created and 18 FR gastrostomy tube was inserted into the lumen of the stomach. THe pursestring were tied and the stomach was tagged to the abdominal wall using 2-0 silks. THe balloon of gastrostomy was inflated and pushed against the abdominal wall. We confirmed that the tube was patent and well positioned. Marcaine  used to performed bilatera TAP blocks. Fascias closed with 0 PDS using small bite technique and skin closed with 4-0 monocryl dermabond used to coat the skin  Patient was taken to the ICU in stable condition where a postoperative chest film has been ordered.

## 2023-10-02 NOTE — Transfer of Care (Signed)
 Immediate Anesthesia Transfer of Care Note  Patient: Whitney [name]  Procedure(s) Performed: CREATION, TRACHEOSTOMY (Neck) CREATION, GASTROSTOMY, OPEN (Abdomen) INSERTION, TUNNELED CENTRAL VENOUS DEVICE, WITH PORT (Left: Chest)  Patient Location: ICU  Anesthesia Type:General  Level of Consciousness: awake, alert , and patient cooperative  Airway & Oxygen Therapy: Patient Spontanous Breathing and Patient connected to tracheostomy mask oxygen  Post-op Assessment: Report given to RN and Post -op Vital signs reviewed and stable  Post vital signs: Reviewed and stable  Last Vitals:  Vitals Value Taken Time  BP    Temp    Pulse    Resp    SpO2      Last Pain:  Vitals:   10/02/23 1024  TempSrc: Temporal  PainSc: 0-No pain         Complications: There were no known notable events for this encounter.

## 2023-10-02 NOTE — Anesthesia Preprocedure Evaluation (Addendum)
 Anesthesia Evaluation  Patient identified by MRN, date of birth, ID band Patient awake    Reviewed: Allergy & Precautions, H&P , NPO status , [name] Chart, lab work & pertinent test results  Airway Mallampati: III  TM Distance: >3 FB Neck ROM: full    Dental  (+) Edentulous Upper, Edentulous Lower   Pulmonary COPD, Current Smoker and Patient abstained from smoking. Left upper lobe mass Cough  SOB   + rhonchi        Cardiovascular Exercise Tolerance: Poor hypertension, Normal cardiovascular exam  Chest tightness with exertion. Pt with known hypoxia with exertion; presumed culprit   Neuro/Psych  PSYCHIATRIC DISORDERS Anxiety Depression    CVA    GI/Hepatic Neg liver ROS,GERD  ,,  Endo/Other  negative endocrine ROS    Renal/GU negative Renal ROS  negative genitourinary   Musculoskeletal   Abdominal  (+) + scaphoid   Peds  Hematology negative hematology ROS (+)   Anesthesia Other Findings HIV-positive =-unclear what her viral load of HIV is  CT chest that showed a left upper lobe mass centrally located and extending to the left hilum with associated mediastinal lymphadenopathy. It also showed a laryngeal mass involving the right and left supraglottic larynx and aryepiglottic folds with supraglottic airway narrowing  Neck mass- biopsied c/w head and neck ca metastatic SCC -increased hoarseness of voice -odynophagia and pain in the chest and arms.   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 No date: Pneumonia No date: Psoriasis No date: PTSD (post-traumatic stress disorder) No date: Stroke [facility]) No date: Tobacco dependence No date: Vapes nicotine  containing substance  Past Surgical History: No date: CESAREAN SECTION 12/13/2022: ESOPHAGOGASTRODUODENOSCOPY (EGD) WITH PROPOFOL ; N/A     Comment:  Procedure: ESOPHAGOGASTRODUODENOSCOPY (EGD) WITH               PROPOFOL ;  Surgeon: Therisa Bi, MD;  [facility]               ENDOSCOPY;  Service: Gastroenterology;  Laterality: N/A; No date: TUBAL LIGATION     Reproductive/Obstetrics negative OB ROS                              Anesthesia Physical Anesthesia Plan  ASA: 4  Anesthesia Plan: General   Post-op Pain Management: Regional block* and Ofirmev  IV (intra-op)*   Induction: Intravenous  PONV Risk Score and Plan: TIVA  Airway Management Planned: Tracheostomy  Additional Equipment:   Intra-op Plan:   Post-operative Plan:   Informed Consent: I have reviewed the patients History and Physical, chart, labs and discussed the procedure including the risks, benefits and alternatives for the proposed anesthesia with the patient or authorized representative who has indicated his/her understanding and acceptance.   Patient has DNR.  Discussed DNR with patient and Suspend DNR.     Plan Discussed with: CRNA and Surgeon  Anesthesia Plan Comments: (Awake tracheostomy planned with sedation as needed. Once tracheostomy in place, GA will be provided for PEG tube placement.)  Anesthesia Quick Evaluation

## 2023-10-02 NOTE — Anesthesia Procedure Notes (Signed)
 Procedure Name: Intubation Date/Time: 10/02/2023 12:25 PM  Performed by: Ledora Duncan, CRNAPre-anesthesia Checklist: Patient identified, Emergency Drugs available, Suction available and Patient being monitored Patient Re-evaluated:Patient Re-evaluated prior to induction Oxygen Delivery Method: Circle system utilized Induction Type: Tracheostomy and IV induction Placement Confirmation: positive ETCO2 and breath sounds checked- equal and bilateral Comments: Pt w/trach-attached to vent w/o incident TFH CRNA

## 2023-10-02 NOTE — Interval H&P Note (Signed)
 History and Physical Interval Note:  10/02/2023 11:47 AM  Whitney [name]  has presented today for surgery, with the diagnosis of neoplasm uncertain behavio- larynx.  The various methods of treatment have been discussed with the patient and family. After consideration of risks, benefits and other options for treatment, the patient has consented to  Procedure(s) with comments: CREATION, TRACHEOSTOMY (N/A) - AWAKE TRACHEOSTOMY CREATION, GASTROSTOMY, OPEN (N/A) INSERTION, TUNNELED CENTRAL VENOUS DEVICE, WITH PORT (N/A) as a surgical intervention.  The patient's history has been reviewed, patient examined, no change in status, stable for surgery.  I have reviewed the patient's chart and labs.  Questions were answered to the patient's satisfaction.     Whitney [name]

## 2023-10-02 NOTE — H&P (Signed)
 History and physical reviewed and will be scanned in later. No change in medical status reported by the patient or family, appears stable for surgery. All questions regarding the procedure answered, and patient (or family if a child) expressed understanding of the procedure. ? ?Sandi Mealy ?@TODAY @ ?

## 2023-10-02 NOTE — Consult Note (Signed)
 NAME:  Whitney [name], MRN:  [id number], DOB:  1970/09/28, LOS: 0 ADMISSION DATE:  10/02/2023, CONSULTATION DATE:  10/02/2023 REFERRING MD:  Dr. Blair, CHIEF COMPLAINT:  Post-op management of Tracheostomy, medical management   Brief Pt Description / Synopsis:  53 y.o. female with PMHx most significant for COPD and HIV, presented for elective Tracheostomy for airway protection due to metastatic Head and Neck Cancer, along with G-tube and Chest port placement.    History of Present Illness:  Whitney [name] is a 53 y.o. female with a past medical history of HIV, COPD, hypertension, hyperlipidemia, malnutrition, PTSD, stroke, anxiety, and depression who presents to [facility] on 10/02/23 for elective Tracheostomy, G-tube, and Chest port placement due to metastatic head and neck cancer.   She recently presented to the emergency room with increasing shortness of breath, along with significant amount of weight loss greater than 30 pounds as well as difficulty swallowing recently.  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   Post procedure she was transferred to Stepdown/ICU for close monitoring.  PCCM asked to consult for medical management post procedure.  Please see Significant Hospital Events section below for full detailed hospital course.   Pertinent  Medical History   Past Medical History:  Diagnosis Date   Anemia    Anxiety    COPD (chronic obstructive pulmonary disease) (HCC)    Depression    GERD (gastroesophageal reflux disease)    History of methicillin resistant staphylococcus aureus (MRSA) 2010   HIV disease (HCC)    dx in 2010   Hyperlipidemia    Hypertension    Lung mass    Malnutrition (HCC)    Marijuana use    Neoplasm of larynx    Pneumonia    Psoriasis    PTSD (post-traumatic stress disorder)    Stroke (HCC)    Tobacco dependence    Vapes nicotine  containing substance     Micro Data:  8/13: MRSA  PCR>>  Antimicrobials:   Anti-infectives (From admission, onward)    Start     Dose/Rate Route Frequency Ordered Stop   10/02/23 0600  cefoTEtan  (CEFOTAN ) 2 g in sodium chloride  0.9 % 100 mL IVPB        2 g 200 mL/hr over 30 Minutes Intravenous On call to O.R. 10/01/23 2240 10/02/23 1204       Significant Hospital Events: Including procedures, antibiotic start and stop dates in addition to other pertinent events   8/13: Presented for elective Tracheostomy placement, open Gastrostomy tube placement, and chest port placement due to metastatic head and neck cancer.  Transferred to Stepdown post procedure, PCCM consulted to assist with management.  Interim History / Subjective:  As outlined above under Significant Hospital Events section  Objective   Blood pressure (!) 131/111, pulse 84, temperature (!) 97.2 F (36.2 C), temperature source Temporal, resp. rate 14, last menstrual period 08/29/2014, SpO2 98%.        Intake/Output Summary (Last 24 hours) at 10/02/2023 1455 Last data filed at 10/02/2023 1417 Gross per 24 hour  Intake 1000 ml  Output 2 ml  Net 998 ml   There were no vitals filed for this visit.  Examination: General: Acute on chronically ill appearing cachectic frail female, laying in bed, anxious, in NAD HENT: Atraumatic, normocephalic, neck supple, newly placed Tracheostomy in place midline Lungs: Clear breath sounds throughout, even, non-labored Cardiovascular: RRR, no M/R/G Abdomen: Soft, tender to palpation, midline  abdominal abdominal dermabond incision clean, dry, intact, and well approximated with G-tube Extremities: Cachectic, no defotmities, no edema, no cyanosis, good peripheral perfusion Neuro: Awake and alert GU: Deferred  Resolved Hospital Problem list     Assessment & Plan:   #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 Parris  #HIV -Monitor fever curve -Trend WBC's  -Follow cultures as above -It is reported she does not take her Biktarvy  nor Triumeq  #Anxiety #Depression PMHx: Stroke -Treatment of metabolic derangements as outlined above -Provide supportive care -Promote normal sleep/wake cycle and family presence -Avoid sedating medications as able -Pain control -Prn Valium  for anxiety      Pt is critically ill with multiorgan failure. Prognosis is guarded, high risk for further decompensation, cardiac arrest, and death.  Given current critical illness superimposed on multiple chronic co-morbidities and advanced age, overall long term prognosis is poor.  Recommend consideration of DNR/DNI status.  Consider Palliative Care to assist with GOC discussions.   Best Practice (right click and Reselect all SmartList Selections daily)   Diet/type: NPO until G-tube cleared for use by Surgery (may use in am on 8/14) DVT prophylaxis: SCD (may resume chemical DVT ppx on 8/14 per Surgery) GI prophylaxis: PPI Lines: N/A Foley:  N/A Code Status:  full code Last date of multidisciplinary goals of care discussion [N/A]  8/13: Pt updated at bedside on plan of care.  Labs   CBC: Recent Labs  Lab 09/26/23 1809  WBC 6.6  HGB 13.9  HCT 46.5*  MCV 85.6  PLT 202    Basic Metabolic Panel: Recent Labs  Lab 09/26/23 1809  NA 142  K 3.9  CL 97*  CO2 35*  GLUCOSE 99  BUN 26*  CREATININE 0.63  CALCIUM 11.3*   GFR: Estimated Creatinine Clearance: 38.6 mL/min (by C-G formula based on SCr of 0.63 mg/dL). Recent Labs  Lab 09/26/23 1809  WBC 6.6    Liver Function Tests: Recent Labs  Lab 09/26/23 1809   AST 28  ALT 30  ALKPHOS 109  BILITOT 0.7  PROT 6.7  ALBUMIN 3.1*   No results for input(s): LIPASE, AMYLASE in the last 168 hours. No results for input(s): AMMONIA in the last 168 hours.  ABG No results found for: PHART, PCO2ART, PO2ART, HCO3, TCO2, ACIDBASEDEF, O2SAT   Coagulation Profile: No results for input(s): INR, PROTIME in the last 168 hours.  Cardiac Enzymes: No results for input(s): CKTOTAL, CKMB, CKMBINDEX, TROPONINI in the last 168 hours.  HbA1C: No results found for: HGBA1C  CBG: Recent Labs  Lab 10/02/23 1440  GLUCAP 104*    Review of Systems:   Unable to assess due to Tracheostomy    Past Medical History:  She,  has a past medical history of Anemia, Anxiety, COPD (chronic obstructive pulmonary disease) (HCC), Depression, GERD (gastroesophageal reflux disease), History of methicillin resistant staphylococcus aureus (MRSA) (2010), HIV disease (HCC), Hyperlipidemia, Hypertension, Lung mass, Malnutrition (HCC), Marijuana use, Neoplasm of larynx, Pneumonia, Psoriasis, PTSD (post-traumatic stress disorder), Stroke (HCC), Tobacco dependence, and Vapes nicotine  containing substance.   Surgical History:   Past Surgical History:  Procedure Laterality Date  CESAREAN SECTION     ESOPHAGOGASTRODUODENOSCOPY (EGD) WITH PROPOFOL  N/A 12/13/2022   Procedure: ESOPHAGOGASTRODUODENOSCOPY (EGD) WITH PROPOFOL ;  Surgeon: Therisa Bi, MD;  Location: [facility] ENDOSCOPY;  Service: Gastroenterology;  Laterality: N/A;   TUBAL LIGATION       Social History:   reports that she has been smoking cigarettes. She has a 18 pack-year smoking history. She has never used smokeless tobacco. She reports that she does not currently use alcohol. She reports current drug use. Drug: Marijuana.   Family History:  Her family history includes CAD in her father; Hypertension in her mother; Parkinson's disease in her father.   Allergies Allergies  Allergen  Reactions   Penicillins Hives   Aspirin Rash and Other (See Comments)    Reaction:  GI upset      Home Medications  Prior to Admission medications   Medication Sig Start Date End Date Taking? Authorizing Provider  acetaminophen  (TYLENOL  CHILDRENS) 160 MG/5ML suspension Take 15.6 mLs (500 mg total) by mouth every 6 (six) hours as needed. 09/01/23  Yes Clarine Ozell LABOR, MD  albuterol  (ACCUNEB ) 1.25 MG/3ML nebulizer solution Take 3 mLs (1.25 mg total) by nebulization every 6 (six) hours as needed for wheezing. 09/01/23  Yes Clarine Ozell LABOR, MD  alum & mag hydroxide-simeth (MAALOX/MYLANTA) 200-200-20 MG/5ML suspension Take 15 mLs by mouth every 6 (six) hours as needed for indigestion or heartburn.   Yes [provider]  feeding supplement, ENSURE ENLIVE, (ENSURE ENLIVE) LIQD Take 237 mLs by mouth 2 (two) times daily between meals. 10/02/14  Yes Vaickute, Rima, MD  Fluticasone -Salmeterol (ADVAIR) 250-50 MCG/DOSE AEPB Inhale 1 puff into the lungs 2 (two) times daily.   Yes [provider]  lidocaine  (XYLOCAINE ) 2 % solution Use as directed 15 mLs in the mouth or throat as needed for mouth pain. 09/01/23  Yes Clarine Ozell LABOR, MD  LORazepam  (ATIVAN ) 0.5 MG tablet Take 0.5 tablets (0.25 mg total) by mouth every 8 (eight) hours as needed for anxiety. 09/23/23  Yes Jacobo Evalene PARAS, MD  Morphine  Sulfate (MORPHINE  CONCENTRATE) 10 mg / 0.5 ml concentrated solution Take 0.25 mLs (5 mg total) by mouth every 6 (six) hours as needed. 09/06/23  Yes Borders, Fonda SAUNDERS, NP  ondansetron  (ZOFRAN -ODT) 4 MG disintegrating tablet Take 1 tablet (4 mg total) by mouth every 8 (eight) hours as needed for nausea or vomiting. 09/23/23  Yes Jacobo Evalene PARAS, MD  Phenylephrine -DM-GG-APAP 5-10-200-325 MG/15ML LIQD Take 15 mLs by mouth daily as needed (allergies).   Yes [provider]  tiotropium (SPIRIVA ) 18 MCG inhalation capsule Place 18 mcg into inhaler and inhale daily.   Yes [provider]   triamcinolone ointment (KENALOG) 0.1 % Apply 1 Application topically daily as needed (irritation). 03/30/21  Yes [provider]  abacavir -dolutegravir -lamiVUDine  (TRIUMEQ) 600-50-300 MG tablet Take 1 tablet by mouth daily. Patient not taking: Reported on 10/01/2023    [provider]  bictegravir-emtricitabine -tenofovir  AF (BIKTARVY ) 50-200-25 MG TABS tablet Take 1 tablet by mouth daily. Patient not taking: Reported on 10/01/2023    [provider]  ezetimibe (ZETIA) 10 MG tablet Take 10 mg by mouth daily. 04/06/21   [provider]  famotidine  (PEPCID ) 20 MG tablet Take 1 tablet (20 mg total) by mouth daily. 09/01/23 10/01/23  Clarine Ozell LABOR, MD  ferrous sulfate  325 (65 FE) MG tablet Take 1 tablet (325 mg total) by mouth 2 (two) times daily with a meal. Patient not taking: Reported on 12/06/2022 01/30/16   Edelmiro Leash, MD  levofloxacin  (LEVAQUIN ) 750 MG tablet Take 1 tablet (750 mg total) by mouth daily. Patient not taking: Reported on 09/27/2023 09/12/14   Barbette Cea, MD  methylPREDNISolone  (MEDROL  DOSEPAK) 4 MG TBPK tablet follow package directions Patient not taking: Reported on 12/06/2022 10/02/14   Vaickute, Rima, MD  naloxone  (NARCAN ) nasal spray 4 mg/0.1 mL SPRAY 1 SPRAY INTO ONE NOSTRIL AS DIRECTED FOR OPIOID OVERDOSE (TURN PERSON ON SIDE AFTER DOSE. IF NO RESPONSE IN 2-3 MINUTES OR PERSON RESPONDS BUT RELAPSES, REPEAT USING A NEW SPRAY DEVICE AND SPRAY INTO THE OTHER NOSTRIL. CALL 911 AFTER USE.) * EMERGENCY USE ONLY * Patient not taking: No sig reported 09/06/23   Borders, Fonda SAUNDERS, NP  pantoprazole  (PROTONIX ) 40 MG tablet Take 1 tablet (40 mg total) by mouth 2 (two) times daily. Patient not taking: No sig reported 12/13/22 12/13/23  Therisa Bi, MD     Care time: 50 minutes     Inge Lecher, AGACNP-BC [facility] Prefer epic messenger for cross cover needs If after hours, please call E-link

## 2023-10-02 NOTE — Op Note (Signed)
 10/02/2023  12:58 PM    Lilyan JAYSON Farr  [id number]   Pre-Op Diagnosis:  malignant neoplasm - larynx  Post-op Diagnosis: malignant neoplasm - larynx  Procedure: Elective Tracheostomy  Surgeon:  Deward GORMAN Dolly  Assistant: Wilmer Hasten  Anesthesia:  Local with MAC  EBL:  minimal  Complications:  None  Findings: None  Procedure: The patient was taken to the Operating Room from the CCU, already intubated, and placed in the supine position.  The skin was injected along the proposed incision line over the trachea with 1% lidocaine  with epinephrine , 1:100,000. The area was then prepped and draped in the usual sterile fashion.  A 15 blade was then used to incise the skin in a horizontal incision over the trachea. The dissection was carried down to the subcutaneous tissues and through the platysma with the Bovie. The strap muscles were divided in the midline and retracted laterally. The thyroid isthmus was exposed and divided in the midline over the trachea and dissected away from the anterior aspect of the trachea with the Harmonic scalpel. With hemostasis obtained, the anesthesiologist was alerted that the airway was about to be entered. 1% lidocaine  with epinephrine  1:100,000 was injected into the trachea to reduce coughing. The scrub tech prepared the tracheostomy tube, confirming no leak in the balloon cuff. The trachea was then incised between the 2nd and third tracheal rings, and an inferiorly based tracheal flap created by cutting through the third tracheal ring laterally. This was sutured up to the skin with a 4-0 Vicryl suture to help create a tracheocutaneous tract. The airway was suctioned and a #6 cuffed Shiley tracheostomy tube was inserted into the tracheal lumen. The inner cannula was placed, the cuff inflated,  and the patient hooked to the anesthesia circuit for ventilation. CO2 return and adequate ventilation was confirmed with the anesthesiologist, and full general  anesthesia induced. Hemostasis was confirmed, with Surgicel placed to help with minor oozing,  and the flange of the tracheostomy tube was sutured to the skin with 3-0 silk suture. A trach tie was placed around the neck to further secure the tracheostomy tube. Betadine soaked gauze was placed around the wound.  The patient was then turned over to Dr. Jordis for G-tube placement with plan to go to the CCU post-op under their care.    Plan: Routine trach care and suctioning each shift and PRN. Sutures can be removed in a week.  Deward GORMAN Dolly 10/02/2023 12:58 PM

## 2023-10-02 NOTE — Progress Notes (Signed)
 Initial Nutrition Assessment  DOCUMENTATION CODES:   Underweight  INTERVENTION:   Once G-tube in place and ready for use:  Osmolite 1.5- Give four cartons daily via tube. Initiate at 1/3 carton and advance as tolerated. Flush with 50ml of water  before and after each feeding.   Regimen provides 1420kcal/day, 60g/day protein and 1178ml/day of free water .   Pt at high refeed risk; recommend monitor potassium, magnesium  and phosphorus labs daily until stable  Daily weights   Recommend check iron /anemia labs   NUTRITION DIAGNOSIS:   Unintentional weight loss related to cancer and cancer related treatments, dysphagia as evidenced by 26 percent weight loss in < 1 year.  GOAL:   Patient will meet greater than or equal to 90% of their needs  MONITOR:   Labs, Weight trends, TF tolerance, Skin, I & O's  REASON FOR ASSESSMENT:   Consult Enteral/tube feeding initiation and management  ASSESSMENT:   53 y/o female with h/o COPD, HIV, depression, anxiety, GERD, stroke, HTN, marijuana use, IDA and  newly diagnosed SCC with noted paryngeal and lung massese with resulting dysphagia who is admitted for tracheostomy, port and surgical G-tube placement.  RD [facility] RD [facility] notifying this RD that patient would be having trach/PEG tube placed today. Pt currently in the OR. Pt with severe malnutrition and is at high refeed risk. RD will see patient once pt admitted onto the floor.   Per chart, pt is down 23lbs(26%) in < 1 year; this is severe weight loss  Medications reviewed:  Labs reviewed: none recent   NUTRITION - FOCUSED PHYSICAL EXAM: Unable to perform a this time   Diet Order:   Diet Order     None      EDUCATION NEEDS:   Not appropriate for education at this time  Skin:   not assessed   Last BM:  uknown  Height:   Ht Readings from Last 1 Encounters:  09/26/23 5' 4 (1.626 m)    Weight:   Wt Readings from Last 1 Encounters:  09/26/23  30.8 kg    Ideal Body Weight:  54.5 kg  BMI:  There is no height or weight on file to calculate BMI.  Estimated Nutritional Needs:   Kcal:  1200-1400kcal/day  Protein:  60-70g/day  Fluid:  900-1157ml/day  Augustin Shams MS, RD, LDN If unable to be reached, please send secure chat to RD inpatient available from 8:00a-4:00p daily

## 2023-10-03 ENCOUNTER — Inpatient Hospital Stay

## 2023-10-03 DIAGNOSIS — E43 Unspecified severe protein-calorie malnutrition: Secondary | ICD-10-CM

## 2023-10-03 LAB — CBC
HCT: 43.2 % (ref 36.0–46.0)
Hemoglobin: 12.9 g/dL (ref 12.0–15.0)
MCH: 25.6 pg — ABNORMAL LOW (ref 26.0–34.0)
MCHC: 29.9 g/dL — ABNORMAL LOW (ref 30.0–36.0)
MCV: 85.7 fL (ref 80.0–100.0)
Platelets: 174 K/uL (ref 150–400)
RBC: 5.04 MIL/uL (ref 3.87–5.11)
RDW: 14.8 % (ref 11.5–15.5)
WBC: 11 K/uL — ABNORMAL HIGH (ref 4.0–10.5)
nRBC: 0 % (ref 0.0–0.2)

## 2023-10-03 LAB — RENAL FUNCTION PANEL
Albumin: 2.6 g/dL — ABNORMAL LOW (ref 3.5–5.0)
Anion gap: 8 (ref 5–15)
BUN: 42 mg/dL — ABNORMAL HIGH (ref 6–20)
CO2: 33 mmol/L — ABNORMAL HIGH (ref 22–32)
Calcium: 11.3 mg/dL — ABNORMAL HIGH (ref 8.9–10.3)
Chloride: 101 mmol/L (ref 98–111)
Creatinine, Ser: 1.02 mg/dL — ABNORMAL HIGH (ref 0.44–1.00)
GFR, Estimated: >60 mL/min (ref >60)
Glucose, Bld: 65 mg/dL — ABNORMAL LOW (ref 70–99)
Phosphorus: 5.9 mg/dL — ABNORMAL HIGH (ref 2.5–4.6)
Potassium: 4.4 mmol/L (ref 3.5–5.1)
Sodium: 142 mmol/L (ref 135–145)

## 2023-10-03 LAB — MAGNESIUM: Magnesium: 2.8 mg/dL — ABNORMAL HIGH (ref 1.7–2.4)

## 2023-10-03 LAB — GLUCOSE, CAPILLARY
Glucose-Capillary: 181 mg/dL — ABNORMAL HIGH (ref 70–99)
Glucose-Capillary: 130 mg/dL — ABNORMAL HIGH (ref 70–99)

## 2023-10-03 MED ORDER — DIAZEPAM 5 MG/ML IJ SOLN
2.5000 mg | Freq: Four times a day (QID) | INTRAMUSCULAR | Status: DC | PRN
  Administered 2023-10-03 – 2023-10-06 (×6): 2.5 mg via INTRAVENOUS
  Administered 2023-10-07 (×2): 5 mg via INTRAVENOUS
  Filled 2023-10-03 (×8): qty 2

## 2023-10-03 MED ORDER — THIAMINE HCL 100 MG PO TABS
100.0000 mg | ORAL_TABLET | Freq: Every day | ORAL | Status: DC
  Administered 2023-10-04 – 2023-10-08 (×5): 100 mg
  Filled 2023-10-03 (×9): qty 1

## 2023-10-03 MED ORDER — MORPHINE SULFATE 10 MG/5ML PO SOLN
5.0000 mg | Freq: Four times a day (QID) | ORAL | Status: DC | PRN
  Administered 2023-10-03 – 2023-10-04 (×2): 5 mg
  Filled 2023-10-03 (×2): qty 5

## 2023-10-03 MED ORDER — FREE WATER
100.0000 mL | Freq: Four times a day (QID) | Status: DC
  Administered 2023-10-03 – 2023-10-08 (×22): 100 mL

## 2023-10-03 MED ORDER — FENTANYL 2500MCG IN NS 250ML (10MCG/ML) PREMIX INFUSION
0.0000 ug/h | INTRAVENOUS | Status: DC
  Administered 2023-10-03: 25 ug/h via INTRAVENOUS
  Filled 2023-10-03 (×2): qty 250

## 2023-10-03 MED ORDER — MIDAZOLAM-SODIUM CHLORIDE 100-0.9 MG/100ML-% IV SOLN
0.5000 mg/h | INTRAVENOUS | Status: DC
  Administered 2023-10-03: 0.5 mg/h via INTRAVENOUS
  Filled 2023-10-03: qty 100

## 2023-10-03 MED ORDER — FENTANYL BOLUS VIA INFUSION
50.0000 ug | Freq: Once | INTRAVENOUS | Status: AC
  Administered 2023-10-03: 50 ug via INTRAVENOUS
  Filled 2023-10-03: qty 50

## 2023-10-03 MED ORDER — NOREPINEPHRINE 4 MG/250ML-% IV SOLN
0.0000 ug/min | INTRAVENOUS | Status: DC
  Administered 2023-10-03: 2 ug/min via INTRAVENOUS

## 2023-10-03 MED ORDER — OSMOLITE 1.5 CAL PO LIQD
120.0000 mL | Freq: Four times a day (QID) | ORAL | Status: AC
  Administered 2023-10-04 (×4): 120 mL

## 2023-10-03 MED ORDER — NOREPINEPHRINE 4 MG/250ML-% IV SOLN: 0.0000 ug/min | INTRAVENOUS | Status: DC

## 2023-10-03 MED ORDER — MIDAZOLAM BOLUS VIA INFUSION
2.0000 mg | Freq: Once | INTRAVENOUS | Status: AC
  Administered 2023-10-03: 2 mg via INTRAVENOUS
  Filled 2023-10-03: qty 2

## 2023-10-03 MED ORDER — DEXTROSE IN LACTATED RINGERS 5 % IV SOLN: INTRAVENOUS | Status: DC

## 2023-10-03 MED ORDER — OSMOLITE 1.5 CAL PO LIQD
237.0000 mL | Freq: Four times a day (QID) | ORAL | Status: DC
  Administered 2023-10-05 – 2023-10-08 (×15): 237 mL

## 2023-10-03 MED ORDER — SODIUM CHLORIDE 0.9 % IV SOLN
250.0000 mL | INTRAVENOUS | Status: AC
  Administered 2023-10-03: 250 mL via INTRAVENOUS

## 2023-10-03 MED ORDER — OSMOLITE 1.5 CAL PO LIQD
80.0000 mL | Freq: Four times a day (QID) | ORAL | Status: AC
  Administered 2023-10-03 (×3): 80 mL

## 2023-10-03 MED ORDER — MIDAZOLAM BOLUS VIA INFUSION
4.0000 mg | Freq: Once | INTRAVENOUS | Status: AC
  Administered 2023-10-03: 4 mg via INTRAVENOUS
  Filled 2023-10-03: qty 4

## 2023-10-03 MED ORDER — CHLORHEXIDINE GLUCONATE CLOTH 2 % EX PADS
6.0000 | MEDICATED_PAD | Freq: Every day | CUTANEOUS | Status: DC
  Administered 2023-10-03 – 2023-10-07 (×5): 6 via TOPICAL

## 2023-10-03 NOTE — Progress Notes (Signed)
 NAME:  Whitney [name], MRN:  [id number], DOB:  [birth year], LOS: 1 ADMISSION DATE:  10/02/2023, CONSULTATION DATE:  10/02/2023 REFERRING MD:  Dr. Blair, CHIEF COMPLAINT:  Post-op management of Tracheostomy, medical management   Brief Pt Description / Synopsis:  53 y.o. female with PMHx most significant for COPD and HIV, presented for elective Tracheostomy for airway protection due to metastatic Head and Neck Cancer, along with G-tube and Chest port placement.    History of Present Illness:  Whitney [name] is a 53 y.o. female with a past medical history of HIV, COPD, hypertension, hyperlipidemia, malnutrition, PTSD, stroke, anxiety, and depression who presents to [facility] on 10/02/23 for elective Tracheostomy, G-tube, and Chest port placement due to metastatic head and neck cancer.   She recently presented to the emergency room with increasing shortness of breath, along with significant amount of weight loss greater than 30 pounds as well as difficulty swallowing recently.  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   Post procedure she was transferred to Stepdown/ICU for close monitoring.  PCCM asked to consult for medical management post procedure.  Please see Significant Hospital Events section below for full detailed hospital course.  10/03/23- patient s/p tracheostomy.  She reports non compliance with HIV medications for quite some time. She is at risk for AIDS/opportunistic infections.  It is important to rule out infections to avoid Immune reconstitution syndrome prior to initiation of HIV therapy. I discussed obtaining bronchoscopy with airway inspection  and bal.  We will plan to obtain cytology from LUL lesion if this is something that can be done with just regular flexible bronch. Oncologist has reviewed care plan and also recommends to have simultaneous cytology for cancer workup done to r/o lung secondary and provide correct  therapy.  Pertinent  Medical History   Past Medical History:  Diagnosis Date   Anemia    Anxiety    COPD (chronic obstructive pulmonary disease) (HCC)    Depression    GERD (gastroesophageal reflux disease)    History of methicillin resistant staphylococcus aureus (MRSA) 2010   HIV disease (HCC)    dx in 2010   Hyperlipidemia    Hypertension    Lung mass    Malnutrition (HCC)    Marijuana use    Neoplasm of larynx    Pneumonia    Psoriasis    PTSD (post-traumatic stress disorder)    Stroke (HCC)    Tobacco dependence    Vapes nicotine  containing substance     Micro Data:  8/13: MRSA PCR>>  Antimicrobials:   Anti-infectives (From admission, onward)    Start     Dose/Rate Route Frequency Ordered Stop   10/02/23 2200  cefoTEtan  (CEFOTAN ) 2 g in sodium chloride  0.9 % 100 mL IVPB        2 g 200 mL/hr over 30 Minutes Intravenous Every 12 hours 10/02/23 1515 10/04/23 0059   10/02/23 0600  cefoTEtan  (CEFOTAN ) 2 g in sodium chloride  0.9 % 100 mL IVPB        2 g 200 mL/hr over 30 Minutes Intravenous On call to O.R. 10/01/23 2240 10/02/23 1300       Significant Hospital Events: Including procedures, antibiotic start and stop dates in addition to other pertinent events   8/13: Presented for elective Tracheostomy placement, open Gastrostomy tube placement, and chest port placement due to metastatic head and neck cancer.  Transferred to Stepdown post procedure, PCCM consulted  to assist with management.  Interim History / Subjective:  As outlined above under Significant Hospital Events section  Objective   Blood pressure (!) 131/93, pulse 67, temperature 98.5 F (36.9 C), temperature source Oral, resp. rate 14, height 5' 4 (1.626 m), weight 31.2 kg, last menstrual period 08/29/2014, SpO2 98%.    FiO2 (%):  [28 %-35 %] 28 %   Intake/Output Summary (Last 24 hours) at 10/03/2023 1011 Last data filed at 10/03/2023 0800 Gross per 24 hour  Intake 2518.02 ml  Output 252 ml   Net 2266.02 ml   Filed Weights   10/02/23 1819 10/03/23 9297  Weight: 30.8 kg 31.2 kg    Examination: General: Acute on chronically ill appearing cachectic frail female, laying in bed, anxious, in NAD HENT: Atraumatic, normocephalic, neck supple, newly placed Tracheostomy in place midline Lungs: Clear breath sounds throughout, even, non-labored Cardiovascular: RRR, no M/R/G Abdomen: Soft, tender to palpation, midline abdominal abdominal dermabond incision clean, dry, intact, and well approximated with G-tube Extremities: Cachectic, no defotmities, no edema, no cyanosis, good peripheral perfusion Neuro: Awake and alert GU: Deferred  Resolved Hospital Problem list     Assessment & Plan:   #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 Parris  #HIV   - medication noncompliance    - possible AIDS   - ruling out opportunistic inectious etiology -Monitor fever curve -Trend WBC's  -Follow cultures as above -It is reported she does not take her Biktarvy  nor Triumeq  #Anxiety #Depression PMHx: Stroke -Treatment of metabolic derangements as outlined above -Provide supportive care -Promote normal sleep/wake cycle and family presence -Avoid sedating medications as able -Pain control -Prn Valium  for anxiety      Pt is critically ill with multiorgan failure. Prognosis is guarded, high risk for further decompensation, cardiac arrest, and death.  Given current critical illness superimposed on multiple chronic co-morbidities and advanced age, overall long term prognosis  is poor.  Recommend consideration of DNR/DNI status.  Consider Palliative Care to assist with GOC discussions.   Best Practice (right click and Reselect all SmartList Selections daily)   Diet/type: NPO until G-tube cleared for use by Surgery (may use in am on 8/14) DVT prophylaxis: SCD (may resume chemical DVT ppx on 8/14 per Surgery) GI prophylaxis: PPI Lines: N/A Foley:  N/A Code Status:  full code Last date of multidisciplinary goals of care discussion [N/A]  8/13: Pt updated at bedside on plan of care.  Labs   CBC: Recent Labs  Lab 09/26/23 1809 10/02/23 1558 10/03/23 0544  WBC 6.6 5.9 11.0*  HGB 13.9 13.3 12.9  HCT 46.5* 43.5 43.2  MCV 85.6 85.5 85.7  PLT 202 172 174    Basic Metabolic Panel: Recent Labs  Lab 09/26/23 1809 10/02/23 1558 10/03/23 0544  NA 142 144 142  K 3.9 4.8 4.4  CL 97* 101 101  CO2 35* 34* 33*  GLUCOSE 99 112* 65*  BUN 26* 34* 42*  CREATININE 0.63 0.99 1.02*  CALCIUM 11.3* 11.8* 11.3*  MG  --   --  2.8*  PHOS  --  5.0* 5.9*   GFR: Estimated Creatinine Clearance:  30.7 mL/min (A) (by C-G formula based on SCr of 1.02 mg/dL (H)). Recent Labs  Lab 09/26/23 1809 10/02/23 1558 10/03/23 0544  WBC 6.6 5.9 11.0*    Liver Function Tests: Recent Labs  Lab 09/26/23 1809 10/02/23 1558 10/03/23 0544  AST 28  --   --   ALT 30  --   --   ALKPHOS 109  --   --   BILITOT 0.7  --   --   PROT 6.7  --   --   ALBUMIN 3.1* 2.7* 2.6*   No results for input(s): LIPASE, AMYLASE in the last 168 hours. No results for input(s): AMMONIA in the last 168 hours.  ABG No results found for: PHART, PCO2ART, PO2ART, HCO3, TCO2, ACIDBASEDEF, O2SAT   Coagulation Profile: No results for input(s): INR, PROTIME in the last 168 hours.  Cardiac Enzymes: No results for input(s): CKTOTAL, CKMB, CKMBINDEX, TROPONINI in the last 168 hours.  HbA1C: No results found for: HGBA1C  CBG: Recent Labs  Lab 10/02/23 1440  10/02/23 2312  GLUCAP 104* 91    Review of Systems:   Unable to assess due to Tracheostomy    Past Medical History:  She,  has a past medical history of Anemia, Anxiety, COPD (chronic obstructive pulmonary disease) (HCC), Depression, GERD (gastroesophageal reflux disease), History of methicillin resistant staphylococcus aureus (MRSA) (2010), HIV disease (HCC), Hyperlipidemia, Hypertension, Lung mass, Malnutrition (HCC), Marijuana use, Neoplasm of larynx, Pneumonia, Psoriasis, PTSD (post-traumatic stress disorder), Stroke (HCC), Tobacco dependence, and Vapes nicotine  containing substance.   Surgical History:   Past Surgical History:  Procedure Laterality Date   CESAREAN SECTION     ESOPHAGOGASTRODUODENOSCOPY (EGD) WITH PROPOFOL  N/A 12/13/2022   Procedure: ESOPHAGOGASTRODUODENOSCOPY (EGD) WITH PROPOFOL ;  Surgeon: Therisa Bi, MD;  Location: [facility];  Service: Gastroenterology;  Laterality: N/A;   TUBAL LIGATION       Social History:   reports that she has been smoking cigarettes. She has a 18 pack-year smoking history. She has never used smokeless tobacco. She reports that she does not currently use alcohol. She reports current drug use. Drug: Marijuana.   Family History:  Her family history includes CAD in her father; Hypertension in her mother; Parkinson's disease in her father.   Allergies Allergies  Allergen Reactions   Penicillins Hives   Aspirin Rash and Other (See Comments)    Reaction:  GI upset      Home Medications  Prior to Admission medications   Medication Sig Start Date End Date Taking? Authorizing Provider  acetaminophen  (TYLENOL  CHILDRENS) 160 MG/5ML suspension Take 15.6 mLs (500 mg total) by mouth every 6 (six) hours as needed. 09/01/23  Yes Clarine Ozell LABOR, MD  albuterol  (ACCUNEB ) 1.25 MG/3ML nebulizer solution Take 3 mLs (1.25 mg total) by nebulization every 6 (six) hours as needed for wheezing. 09/01/23  Yes Clarine Ozell LABOR, MD  alum & mag  hydroxide-simeth (MAALOX/MYLANTA) 200-200-20 MG/5ML suspension Take 15 mLs by mouth every 6 (six) hours as needed for indigestion or heartburn.   Yes [provider]  feeding supplement, ENSURE ENLIVE, (ENSURE ENLIVE) LIQD Take 237 mLs by mouth 2 (two) times daily between meals. 10/02/14  Yes Vaickute, Rima, MD  Fluticasone -Salmeterol (ADVAIR) 250-50 MCG/DOSE AEPB Inhale 1 puff into the lungs 2 (two) times daily.   Yes [provider]  lidocaine  (XYLOCAINE ) 2 % solution Use as directed 15 mLs in the mouth or throat as needed for mouth pain. 09/01/23  Yes Clarine Ozell LABOR, MD  LORazepam  (ATIVAN ) 0.5  MG tablet Take 0.5 tablets (0.25 mg total) by mouth every 8 (eight) hours as needed for anxiety. 09/23/23  Yes Jacobo Evalene PARAS, MD  Morphine  Sulfate (MORPHINE  CONCENTRATE) 10 mg / 0.5 ml concentrated solution Take 0.25 mLs (5 mg total) by mouth every 6 (six) hours as needed. 09/06/23  Yes Borders, Fonda SAUNDERS, NP  ondansetron  (ZOFRAN -ODT) 4 MG disintegrating tablet Take 1 tablet (4 mg total) by mouth every 8 (eight) hours as needed for nausea or vomiting. 09/23/23  Yes Jacobo Evalene PARAS, MD  Phenylephrine -DM-GG-APAP 5-10-200-325 MG/15ML LIQD Take 15 mLs by mouth daily as needed (allergies).   Yes [provider]  tiotropium (SPIRIVA ) 18 MCG inhalation capsule Place 18 mcg into inhaler and inhale daily.   Yes [provider]  triamcinolone ointment (KENALOG) 0.1 % Apply 1 Application topically daily as needed (irritation). 03/30/21  Yes [provider]  abacavir -dolutegravir -lamiVUDine  (TRIUMEQ) 600-50-300 MG tablet Take 1 tablet by mouth daily. Patient not taking: Reported on 10/01/2023    [provider]  bictegravir-emtricitabine -tenofovir  AF (BIKTARVY ) 50-200-25 MG TABS tablet Take 1 tablet by mouth daily. Patient not taking: Reported on 10/01/2023    [provider]  ezetimibe (ZETIA) 10 MG tablet Take 10 mg by mouth daily. 04/06/21   [provider]  famotidine  (PEPCID ) 20 MG tablet Take 1 tablet (20 mg total) by mouth daily. 09/01/23 10/01/23  Clarine Ozell LABOR, MD  ferrous sulfate  325 (65 FE) MG tablet Take 1 tablet (325 mg total) by mouth 2 (two) times daily with a meal. Patient not taking: Reported on 12/06/2022 01/30/16   Edelmiro Leash, MD  levofloxacin  (LEVAQUIN ) 750 MG tablet Take 1 tablet (750 mg total) by mouth daily. Patient not taking: Reported on 09/27/2023 09/12/14   Barbette Cea, MD  methylPREDNISolone  (MEDROL  DOSEPAK) 4 MG TBPK tablet follow package directions Patient not taking: Reported on 12/06/2022 10/02/14   Vaickute, Rima, MD  naloxone  (NARCAN ) nasal spray 4 mg/0.1 mL SPRAY 1 SPRAY INTO ONE NOSTRIL AS DIRECTED FOR OPIOID OVERDOSE (TURN PERSON ON SIDE AFTER DOSE. IF NO RESPONSE IN 2-3 MINUTES OR PERSON RESPONDS BUT RELAPSES, REPEAT USING A NEW SPRAY DEVICE AND SPRAY INTO THE OTHER NOSTRIL. CALL 911 AFTER USE.) * EMERGENCY USE ONLY * Patient not taking: No sig reported 09/06/23   Borders, Fonda SAUNDERS, NP  pantoprazole  (PROTONIX ) 40 MG tablet Take 1 tablet (40 mg total) by mouth 2 (two) times daily. Patient not taking: No sig reported 12/13/22 12/13/23  Therisa Bi, MD     Critical care provider statement:   Total critical care time: 33 minutes   Performed by: Parris MD   Critical care time was exclusive of separately billable procedures and treating other patients.   Critical care was necessary to treat or prevent imminent or life-threatening deterioration.   Critical care was time spent personally by me on the following activities: development of treatment plan with patient and/or surrogate as well as nursing, discussions with consultants, evaluation of patient's response to treatment, examination of patient, obtaining history from patient or surrogate, ordering and performing treatments and interventions, ordering and review of laboratory studies, ordering and review of radiographic studies, pulse oximetry  and re-evaluation of patient's condition.    Alannah Averhart, M.D.  Pulmonary & Critical Care Medicine

## 2023-10-03 NOTE — Therapy (Signed)
 Bronch performed with patient on the vent.  No adverse reactions. SCOPE H190 #192 used NEEDLE-[id number] BRUSH- [id number] NEEDLE- [id number] ONU#[id number] TIME OUT BY RN AT 12:47P

## 2023-10-03 NOTE — Anesthesia Postprocedure Evaluation (Signed)
 Anesthesia Post Note  Patient: Whitney [name]  Procedure(s) Performed: CREATION, TRACHEOSTOMY (Neck) CREATION, GASTROSTOMY, OPEN (Abdomen) INSERTION, TUNNELED CENTRAL VENOUS DEVICE, WITH PORT (Left: Chest)  Patient location during evaluation: ICU Anesthesia Type: General Level of consciousness: awake and alert Pain management: pain level controlled Vital Signs Assessment: post-procedure vital signs reviewed and stable Respiratory status: patient on ventilator - see flowsheet for VS Cardiovascular status: stable Postop Assessment: no apparent nausea or vomiting Anesthetic complications: no   There were no known notable events for this encounter.   Last Vitals:  Vitals:   10/03/23 0400 10/03/23 0500  BP: 118/84 (!) 131/93  Pulse:    Resp: 12 14  Temp:    SpO2: 96% 98%    Last Pain:  Vitals:   10/03/23 0613  TempSrc:   PainSc: 4                  Apolonio Cutting [address]

## 2023-10-03 NOTE — Plan of Care (Signed)

## 2023-10-03 NOTE — Discharge Instructions (Signed)
 G-Tube (Gastrostomy Tube): What to Know After After your G-tube is replaced, you may feel a little pain in your belly. You may also have a small amount of blood-colored fluid leaking from the site of your G-tube replacement. Follow these instructions at home:  [facility] your hands for at least 20 seconds before and after caring for your G-tube. Check the skin around your tube insertion site for: Redness. Irritation. Swelling. Drainage. Growth of extra tissue. Care for your G-tube as you did before, or as told by your provider. You may return to your normal feedings. Return to your normal activities as told by your provider. Ask what things are safe for you to do at home. Contact a health care provider if: You have a fever or chills. You see any of these signs on the skin near your insertion site: Redness. Irritation. Swelling. Drainage. Growth of extra tissue. You have pain in your belly. There's blood, fluid, or discharge around your G-tube. Your new tube is not working. Your tube comes out. This information is not intended to replace advice given to you by your health care provider. Make sure you discuss any questions you have with your health care provider. Document Revised: 09/25/2022 Document Reviewed: 09/25/2022 Elsevier Patient Education  2024 Elsevier Inc.  How To Give Medicine Through a Feeding Tube A feeding tube is a soft, flexible tube used to give medicine, water , and liquid food. A person may need a feeding tube if they have trouble swallowing or cannot have food or medicine by mouth. The tube is put right into the stomach.  The following information gives steps on how to give medicine through a feeding tube. This information is meant for adults and children over 1 year of age. Supplies needed: Syringes for each medicine to be given. Medicine or medicines. Pill crusher, if the medicine is a tablet or pill. Small, clean cup for each medicine to be given. Sterile or  purified water . How to prepare the medicine     Wash your hands with soap and water  for at least 20 seconds. Get each medicine ready to go into the tube. If the medicine is a liquid: Connect a syringe to the medicine bottle using an adapter or use a medicine straw attached to a syringe. If you cannot connect the syringe to the medicine bottle, put the right amount of medicine in a clean medicine cup. Mix it with water  as told by the health care provider. Put the tip of the syringe into the mixture. Gently pull back the plunger to pull the medicine into the syringe. If the medicine is a tablet or pill: Use a pill-crushing tool to make it into a fine powder. In a medicine cup, mix the powder with the amount of water  you were told to use. Put the tip of a syringe into the medicine-and-water  mixture. Gently pull back the plunger to pull the mixture into the syringe. If the medicine is a capsule that contains dry pellets and you're told to open it: Open the capsule. Empty the pellets into a cup with the amount of water  you were told to use. Put the tip of a syringe into the medicine-and-water  mixture. Gently pull back the plunger to pull the mixture into the syringe. If the medicine is a capsule that contains liquid or a gelcap, use it in one of these ways: Poke a small hole in the gelcap and squeeze its liquid into a cup with the amount of water  you were told to  use. Or, dissolve the unopened gelcap in the amount of water  you were told to use. Put the tip of a syringe into the medicine-and-water  mixture. Gently pull back the plunger to pull the mixture into the syringe. Hold the syringe with the tip up. Tap the side of the syringe to move air bubbles to the tip. Push gently on the plunger to push the air out of the tip of the syringe. Do not push out the liquid. If the syringe has a moat at the tip, check for medicine in the moat. The moat is the open space inside the feeding tube connector. Use  a piece of gauze or paper towel to clean out any medicine in there. Have all of the medicines ready and in syringes before getting the feeding tube ready. How to give medicine through the feeding tube Prepare If the medicine cannot be given along with a tube feeding, stop the feeding and wait for as long as you're told. This is called hold time. You may need to wait up to 2 hours, if the medicine needs to be taken on an empty stomach. Wash your hands with soap and water  for at least 20 seconds. Have all supplies ready and near you. Each medicine should be in a syringe with the air removed. You will need a syringe and water  to flush the feeding tube between each medicine. Use pillows to position or sit up at a 30- to 45-degree angle or higher as told by the provider. Staying upright during the feeding and for at least 30-60 minutes after the feeding can prevent food from flowing back (reflux). It can also help so a person doesn't feel like throwing up. Deliver medicine  Take the cap off the end of the feeding tube. If it's a button instead of a tube, put the adapter into it. Open the clamp on the feeding tube. Flush and check placement of the feeding tube as told. Clamp the feeding tube. Connect a syringe filled with medicine to the feeding tube. If the feeding tube has a medicine port, connect the syringe to the medicine port of the feeding tube. Open the clamp on the feeding tube. Slowly push the plunger on the syringe to push the medicine into the feeding tube. Clamp the feeding tube. Disconnect the syringe from the feeding tube Disconnect the syringe from the feeding tube. Flush the feeding tube as told. Repeat the process if there's more than one medicine to take. When done with the last flush, clamp the feeding tube. You must close or clamp the tube to: Keep it from leaking. Keep air from getting into the stomach. Put the cap back on the end of the feeding tube. Keep a cap on the  tube to keep germs from getting in it. Wash your hands with soap and water  for at least 20 seconds. If a tube feeding needs to be started after the medicine, but the medicine cannot be given with the feeding, wait 30-60 minutes after giving medicine. Then start the feeding. How to prevent mistakes when giving medicines Do not use or crush the following types of medicines because they can give too much medicine at one time: Extended-release medicines. Long-acting medicines. Delayed-release medicines. Give the medicine only as told. Do not mix medicines in 1 syringe. If giving only 1 dose of medicine, flush the feeding tube with water  after giving the medicine. If giving more than 1 medicine, give each medicine separately. Flush the tube between each medicine and  after the last dose of medicine. Use a clean syringe for each medicine. Do not mix medicines with formula. Use water . Use liquid medicines when possible. Talk with the provider or pharmacist about this. General tips Use, clean, and reuse feeding tube equipment only as told by the provider. Follow the provider's instructions on how and when to vent or open the feeding tube. Contact a health care provider if: You're having trouble giving medicines through the feeding tube. You're unsure what medicines to give or the dose you should give. You're unsure of how to time the medicines with feedings. The feeding tube gets clogged, comes out, or does not work. You have any questions or concerns about the feeding tube or medicines. This information is not intended to replace advice given to you by your health care provider. Make sure you discuss any questions you have with your health care provider. Document Revised: 06/07/2022 Document Reviewed: 06/07/2022 Elsevier Patient Education  2024 Elsevier Inc.  Home TF regimen:  250 ml Nutren 1.5 via g-tube 4 times daily; 50 ml free water  flush before and after each administration  Regimen  provides 1520 kcals, 68 grams protein, 764 ml water  (1164 ml water  with inclusion of flushes)

## 2023-10-03 NOTE — Plan of Care (Signed)
  Problem: Education: Goal: Knowledge of General Education information will improve Description: Including pain rating scale, medication(s)/side effects and non-pharmacologic comfort measures Outcome: Progressing   Problem: Health Behavior/Discharge Planning: Goal: Ability to manage health-related needs will improve Outcome: Progressing   Problem: Clinical Measurements: Goal: Ability to maintain clinical measurements within normal limits will improve Outcome: Progressing Goal: Will remain free from infection Outcome: Progressing Goal: Diagnostic test results will improve Outcome: Progressing Goal: Respiratory complications will improve Outcome: Progressing Goal: Cardiovascular complication will be avoided Outcome: Progressing   Problem: Activity: Goal: Risk for activity intolerance will decrease Outcome: Progressing   Problem: Coping: Goal: Level of anxiety will decrease Outcome: Progressing   Problem: Elimination: Goal: Will not experience complications related to urinary retention Outcome: Progressing   Problem: Pain Managment: Goal: General experience of comfort will improve and/or be controlled Outcome: Progressing   Problem: Safety: Goal: Ability to remain free from injury will improve Outcome: Progressing   Problem: Skin Integrity: Goal: Risk for impaired skin integrity will decrease Outcome: Progressing

## 2023-10-03 NOTE — Procedures (Signed)
 FIBEROPTIC BRONCHOSCOPY WITH BRONCHOALVEOLAR LAVAGE PROCEDURE NOTE  THERAPEUTIC ASPIRATION OF TRACHEOBRONCHIAL TREE  TRANSBRONCHIAL FNA X 1 LOBE  TRANSBRONCHIAL SURGICAL LUNG BIOPSY X 1 LOBE  TRANSBRONCHIAL CYTOPATHOLOGY BRUSHINGS - LEFT UPPER LOBE    Flexible bronchoscopy was performed  by : Parris MD  assistance by : 1)Repiratory therapist  Madelin Benders and Amy Fisher and 2)cytotech Joella  and 3) ICU RN 4 Emily ) ICU RN 2 Abigail and 5) BRONCHOSCOPY CLINICAL TECH John and Asberry   Indication for the procedure was :  Pre-procedural H&P. The following assessment was performed on the day of the procedure prior to initiating sedation History:  Chest pain n Dyspnea y Hemoptysis n Cough y Fever n Other pertinent items n  Examination Vital signs -reviewed as per nursing documentation today Cardiac    Murmurs: n  Rubs : n  Gallop: n Lungs Wheezing: n Rales : n Rhonchi :y  Other pertinent findings: SOB/hypoxemia due to chronic lung disease   Pre-procedural assessment for Procedural Sedation included: Depth of sedation: As per anesthesia team  ASA Classification:  2 Mallampati airway assessment: 3    Medication list reviewed: y  The patient's interval history was taken and revealed: no new complaints The pre- procedure physical examination revealed: No new findings Refer to prior clinic note for details.  Informed Consent: Informed consent was obtained from:  patient after explanation of procedure and risks, benefits, as well as alternative procedures available.  Explanation of level of sedation and possible transfusion was also provided.    Procedural Preparation: Time out was performed and patient was identified by name and birthdate and procedure to be performed and side for sampling, if any, was specified. Pt was intubated by anesthesia.  The patient was appropriately draped.   Fiberoptic bronchoscopy with airway inspection, therapeutic aspiration of  tracheobronchial tree and BAL Procedure findings:  Bronchoscope was inserted via tracheostomy. The distal trachea was normal in circumference and appearance without mucosal, cartilaginous or branching abnormalities.  The main carina was mildly splayed .  The right manstem bronchus had small mucosal ulceration appx 3mm x 4mm at carina bw upper and lower division. There were loose phlegm throughout and thickened phlegm with mucus plugging at RLL this was aspirated and removed. BAL was performed at RML x 1 for microbiology.   All right and left lobar airways were NOT visualized to the Subsegmental level due to obstructed airways from phlegm and mucus  plugging.   The mucosa was : friable at target segment LUL  Airways were notable for:        exophytic lesions :n       extrinsic compression in the following distributions: n.       Friable mucosa: y       Teacher, music /pigmentation: n     Post procedure Diagnosis:   Mucus plugging of tracheobronchial tree     Left upper lobe lesion was visualized and biopsied as below   Target 1 - LUL -Transbronchial cytobrushing x 1 ,   Transbronchial FNA x 1,   Transbronchial surgical pathology x 7,    BAL was performed at this location and sent for processing with cytology and microbiology  Post procedure diagnosis: left upper lobe lung mass      Immediate sampling complications included:NONE immediate  Epinephrine  ZERO ml was used topically  The bronchoscopy was terminated due to completion of the planned procedure and the bronchoscope was removed.   Total dosage of Lidocaine  was ZERO mg  Estimated  Blood loss: EXPECTED < 5cc.  Complications included:  NONE   Preliminary CXR findings :  IN PROCESS  Disposition: in ICU  Follow up with Dr. Tera Pellicane in 5 days for result discussion.     Halina Picking MD  [facility] Division of Pulmonary & Critical Care Medicine

## 2023-10-03 NOTE — Progress Notes (Signed)
 Whitney [name], Whitney [name] [id number] 07-16-1970 Whitney GORMAN Dolly, MD   SUBJECTIVE: This 53 y.o. year old female is status post PR TRACHEOSTOMY PLANNED SEPARATE PROCEDURE [31600] PR EGD PERCUTANEOUS PLACEMENT GASTROSTOMY TUBE [43246] PR INSJ TUNNELED CTR VAD W/SUBQ PORT AGE 36 YR/> [36561].  Postop day 1 status post elective tracheostomy for malignant laryngeal cancer.  She is doing well.  Balloon remains up so she cannot talk.  No bleeding around the tracheostomy site.  Medications:  Current Facility-Administered Medications  Medication Dose Route Frequency Provider Last Rate Last Admin   acetaminophen  (OFIRMEV ) IV 1,000 mg  1,000 mg Intravenous Q6H Pabon, Diego F, MD   Stopped at 10/03/23 9371   cefoTEtan  (CEFOTAN ) 2 g in sodium chloride  0.9 % 100 mL IVPB  2 g Intravenous Q12H Pabon, [place] F, MD   Stopped at 10/03/23 0145   Chlorhexidine  Gluconate Cloth 2 % PADS 6 each  6 each Topical Daily Aleskerov, Fuad, MD       diazepam  (VALIUM ) injection 2.5-5 mg  2.5-5 mg Intravenous Q8H PRN Keene, Jeremiah D, NP       docusate (COLACE) 50 MG/5ML liquid 100 mg  100 mg Per Tube BID PRN Keene, Jeremiah D, NP       fentaNYL  (SUBLIMAZE ) injection 25 mcg  25 mcg Intravenous Q2H PRN Keene, Jeremiah D, NP   25 mcg at 10/02/23 2200   fluticasone  furoate-vilanterol (BREO ELLIPTA ) 200-25 MCG/ACT 1 puff  1 puff Inhalation Daily Keene, Jeremiah D, NP   1 puff at 10/02/23 1642   ipratropium-albuterol  (DUONEB) 0.5-2.5 (3) MG/3ML nebulizer solution 3 mL  3 mL Nebulization Q4H PRN Keene, Jeremiah D, NP       ketorolac  (TORADOL ) 15 MG/ML injection 15 mg  15 mg Intravenous Q6H Pabon, Laneta F, MD   15 mg at 10/03/23 9386   lactated ringers  infusion   Intravenous Continuous Keene, Jeremiah D, NP 75 mL/hr at 10/03/23 0800 Infusion Verify at 10/03/23 0800   lip balm (CARMEX) ointment 1 Application  1 Application Topical PRN Rust-Chester, Jenita CROME, NP       ondansetron  (ZOFRAN ) injection 4 mg  4 mg Intravenous Q6H PRN Keene, Jeremiah D,  NP       pantoprazole  (PROTONIX ) injection 40 mg  40 mg Intravenous Q24H Keene, Jeremiah D, NP   40 mg at 10/02/23 1637   polyethylene glycol (MIRALAX  / GLYCOLAX ) packet 17 g  17 g Per Tube Daily PRN Keene, Jeremiah D, NP       umeclidinium bromide  (INCRUSE ELLIPTA ) 62.5 MCG/ACT 1 puff  1 puff Inhalation Daily Shellia Mann D, NP   1 puff at 10/02/23 1642  .  Medications Prior to Admission  Medication Sig Dispense Refill   acetaminophen  (TYLENOL  CHILDRENS) 160 MG/5ML suspension Take 15.6 mLs (500 mg total) by mouth every 6 (six) hours as needed. 236 mL 0   albuterol  (ACCUNEB ) 1.25 MG/3ML nebulizer solution Take 3 mLs (1.25 mg total) by nebulization every 6 (six) hours as needed for wheezing. 75 mL 12   alum & mag hydroxide-simeth (MAALOX/MYLANTA) 200-200-20 MG/5ML suspension Take 15 mLs by mouth every 6 (six) hours as needed for indigestion or heartburn.     feeding supplement, ENSURE ENLIVE, (ENSURE ENLIVE) LIQD Take 237 mLs by mouth 2 (two) times daily between meals. 237 mL 12   Fluticasone -Salmeterol (ADVAIR) 250-50 MCG/DOSE AEPB Inhale 1 puff into the lungs 2 (two) times daily.     lidocaine  (XYLOCAINE ) 2 % solution Use as directed 15 mLs in the mouth or throat  as needed for mouth pain. 100 mL 0   LORazepam  (ATIVAN ) 0.5 MG tablet Take 0.5 tablets (0.25 mg total) by mouth every 8 (eight) hours as needed for anxiety. 30 tablet 0   Morphine  Sulfate (MORPHINE  CONCENTRATE) 10 mg / 0.5 ml concentrated solution Take 0.25 mLs (5 mg total) by mouth every 6 (six) hours as needed. 30 mL 0   ondansetron  (ZOFRAN -ODT) 4 MG disintegrating tablet Take 1 tablet (4 mg total) by mouth every 8 (eight) hours as needed for nausea or vomiting. 30 tablet 1   Phenylephrine -DM-GG-APAP 5-10-200-325 MG/15ML LIQD Take 15 mLs by mouth daily as needed (allergies).     tiotropium (SPIRIVA ) 18 MCG inhalation capsule Place 18 mcg into inhaler and inhale daily.     triamcinolone ointment (KENALOG) 0.1 % Apply 1 Application  topically daily as needed (irritation).     abacavir -dolutegravir -lamiVUDine  (TRIUMEQ) 600-50-300 MG tablet Take 1 tablet by mouth daily. (Patient not taking: Reported on 10/01/2023)     bictegravir-emtricitabine -tenofovir  AF (BIKTARVY ) 50-200-25 MG TABS tablet Take 1 tablet by mouth daily. (Patient not taking: Reported on 10/01/2023)     ezetimibe (ZETIA) 10 MG tablet Take 10 mg by mouth daily.     famotidine  (PEPCID ) 20 MG tablet Take 1 tablet (20 mg total) by mouth daily. 30 tablet 0   ferrous sulfate  325 (65 FE) MG tablet Take 1 tablet (325 mg total) by mouth 2 (two) times daily with a meal. (Patient not taking: Reported on 12/06/2022) 60 tablet 0   levofloxacin  (LEVAQUIN ) 750 MG tablet Take 1 tablet (750 mg total) by mouth daily. (Patient not taking: Reported on 09/27/2023) 6 tablet 0   methylPREDNISolone  (MEDROL  DOSEPAK) 4 MG TBPK tablet follow package directions (Patient not taking: Reported on 12/06/2022) 21 tablet 0   naloxone  (NARCAN ) nasal spray 4 mg/0.1 mL SPRAY 1 SPRAY INTO ONE NOSTRIL AS DIRECTED FOR OPIOID OVERDOSE (TURN PERSON ON SIDE AFTER DOSE. IF NO RESPONSE IN 2-3 MINUTES OR PERSON RESPONDS BUT RELAPSES, REPEAT USING A NEW SPRAY DEVICE AND SPRAY INTO THE OTHER NOSTRIL. CALL 911 AFTER USE.) * EMERGENCY USE ONLY * (Patient not taking: No sig reported) 1 each 0   pantoprazole  (PROTONIX ) 40 MG tablet Take 1 tablet (40 mg total) by mouth 2 (two) times daily. (Patient not taking: No sig reported) 90 tablet 1    OBJECTIVE:  PHYSICAL EXAM  Vitals: Blood pressure (!) 131/93, pulse 67, temperature 98.5 F (36.9 C), temperature source Oral, resp. rate 14, height 5' 4 (1.626 m), weight 31.2 kg, last menstrual period 08/29/2014, SpO2 98%.. General: Cachectic female in no acute distress mood: Mood and affect well adjusted, pleasant and cooperative. Orientation: Grossly alert and oriented. Neck: Tracheostomy tube is in good position with no bleeding around the wound.    MEDICAL DECISION  MAKING: Data Review:  Results for orders placed or performed during the hospital encounter of 10/02/23 (from the past 48 hours)  Glucose, capillary     Status: Abnormal   Collection Time: 10/02/23  2:40 PM  Result Value Ref Range   Glucose-Capillary 104 (H) 70 - 99 mg/dL    Comment: Glucose reference range applies only to samples taken after fasting for at least 8 hours.  MRSA Next Gen by PCR, Nasal     Status: None   Collection Time: 10/02/23  2:57 PM   Specimen: Nasal Mucosa; Nasal Swab  Result Value Ref Range   MRSA by PCR Next Gen NOT DETECTED NOT DETECTED    Comment: (NOTE) The GeneXpert  MRSA Assay (FDA approved for NASAL specimens only), is one component of a comprehensive MRSA colonization surveillance program. It is not intended to diagnose MRSA infection nor to guide or monitor treatment for MRSA infections. Test performance is not FDA approved in patients less than 61 years old. Performed at [facility], [address] [place], [place] [address]   CBC     Status: None   Collection Time: 10/02/23  3:58 PM  Result Value Ref Range   WBC 5.9 4.0 - 10.5 K/uL   RBC 5.09 3.87 - 5.11 MIL/uL   Hemoglobin 13.3 12.0 - 15.0 g/dL   HCT 56.4 63.9 - 53.9 %   MCV 85.5 80.0 - 100.0 fL   MCH 26.1 26.0 - 34.0 pg   MCHC 30.6 30.0 - 36.0 g/dL   RDW 85.3 88.4 - 84.4 %   Platelets 172 150 - 400 K/uL   nRBC 0.0 0.0 - 0.2 %    Comment: Performed at [facility], [address], [place], [place] [address]  Renal function panel     Status: Abnormal   Collection Time: 10/02/23  3:58 PM  Result Value Ref Range   Sodium 144 135 - 145 mmol/L   Potassium 4.8 3.5 - 5.1 mmol/L   Chloride 101 98 - 111 mmol/L   CO2 34 (H) 22 - 32 mmol/L   Glucose, Bld 112 (H) 70 - 99 mg/dL    Comment: Glucose reference range applies only to samples taken after fasting for at least 8 hours.   BUN 34 (H) 6 - 20 mg/dL   Creatinine, Ser 9.00 0.44 - 1.00 mg/dL   Calcium 88.1 (H) 8.9 - 10.3 mg/dL    Phosphorus 5.0 (H) 2.5 - 4.6 mg/dL   Albumin 2.7 (L) 3.5 - 5.0 g/dL   GFR, Estimated >39 >39 mL/min    Comment: (NOTE) Calculated using the CKD-EPI Creatinine Equation (2021)    Anion gap 9 5 - 15    Comment: Performed at [facility], [address] [place], [place] [address]  Glucose, capillary     Status: None   Collection Time: 10/02/23 11:12 PM  Result Value Ref Range   Glucose-Capillary 91 70 - 99 mg/dL    Comment: Glucose reference range applies only to samples taken after fasting for at least 8 hours.  CBC     Status: Abnormal   Collection Time: 10/03/23  5:44 AM  Result Value Ref Range   WBC 11.0 (H) 4.0 - 10.5 K/uL   RBC 5.04 3.87 - 5.11 MIL/uL   Hemoglobin 12.9 12.0 - 15.0 g/dL   HCT 56.7 63.9 - 53.9 %   MCV 85.7 80.0 - 100.0 fL   MCH 25.6 (L) 26.0 - 34.0 pg   MCHC 29.9 (L) 30.0 - 36.0 g/dL   RDW 85.1 88.4 - 84.4 %   Platelets 174 150 - 400 K/uL   nRBC 0.0 0.0 - 0.2 %    Comment: Performed at [facility], [address], [place], [place] [address]  Renal function panel     Status: Abnormal   Collection Time: 10/03/23  5:44 AM  Result Value Ref Range   Sodium 142 135 - 145 mmol/L   Potassium 4.4 3.5 - 5.1 mmol/L   Chloride 101 98 - 111 mmol/L   CO2 33 (H) 22 - 32 mmol/L   Glucose, Bld 65 (L) 70 - 99 mg/dL    Comment: Glucose reference range applies only to samples taken after  fasting for at least 8 hours.   BUN 42 (H) 6 - 20 mg/dL   Creatinine, Ser 8.97 (H) 0.44 - 1.00 mg/dL   Calcium 88.6 (H) 8.9 - 10.3 mg/dL   Phosphorus 5.9 (H) 2.5 - 4.6 mg/dL   Albumin 2.6 (L) 3.5 - 5.0 g/dL   GFR, Estimated >39 >39 mL/min    Comment: (NOTE) Calculated using the CKD-EPI Creatinine Equation (2021)    Anion gap 8 5 - 15    Comment: Performed at [facility], [address] [place], [place] [address]  Magnesium      Status: Abnormal   Collection Time: 10/03/23  5:44 AM  Result Value Ref Range   Magnesium  2.8 (H) 1.7 - 2.4 mg/dL     Comment: Performed at [facility], [address], [place], [place] [address]  . DG CHEST PORT 1 VIEW Result Date: 10/02/2023 CLINICAL DATA:  Port-A-Cath in place. Open gastrostomy tube placement performed earlier today. EXAM: PORTABLE CHEST 1 VIEW COMPARISON:  Radiographs 09/26/2023 FINDINGS: New left internal jugular venous catheter Port-A-Cath in place. There is a kink in the catheter just above the left clavicle. Catheter tip is in the right atrium. Tracheostomy tube tip at the thoracic inlet. No visible pneumothorax. Stable heart size and mediastinal contours. Left hilar mass is grossly stable in radiographic appearance. The lungs are hyperinflated. No pleural fluid or acute airspace disease. There is air under both hemidiaphragms, likely related to gastrostomy placement. Gastrostomy is located in the left upper quadrant. IMPRESSION: 1. New left internal jugular venous catheter Port-A-Cath in place. There is a kink in the catheter just above the left clavicle. Catheter tip in the right atrium. No pneumothorax. 2. Left hilar mass is grossly stable in radiographic appearance. 3. Air under both hemidiaphragms, likely related to same day gastrostomy placement. Electronically Signed   By: Andrea Gasman M.D.   On: 10/02/2023 15:55   DG C-Arm 1-60 Min-No Report Result Date: 10/02/2023 Fluoroscopy was utilized by the requesting physician.  No radiographic interpretation.  .   ASSESSMENT: Status post elective tracheostomy doing well.  Balloon may be left down in the next couple of days as long as there are not excessive secretions so that the patient can begin to phonate.\ PLAN: Continue trach care.  Sutures may come out next Wednesday.   Whitney GORMAN Dolly, MD 10/03/2023 8:17 AMPatient ID: Whitney [name], female   DOB: 1971/01/19, 53 y.o.   MRN: [id number]

## 2023-10-03 NOTE — Progress Notes (Signed)
 PT Cancellation Note  Patient Details Name: Whitney [name] MRN: [id number] DOB: 1970/05/26   Cancelled Treatment:    Reason Eval/Treat Not Completed: Other (comment) Spoke with nurse who reports pt is still very lethargic after bronchoscopy earlier today.  Will hold this date and attempt to see pt for eval tomorrow.   [name], DPT 10/03/2023, 3:34 PM

## 2023-10-03 NOTE — Progress Notes (Signed)
 Nutrition Follow Up Note   DOCUMENTATION CODES:   Severe malnutrition in context of chronic illness  INTERVENTION:   Osmolite 1.5- Give four cartons daily via tube. Initiate at 1/3 carton and advance as tolerated. Flush with 50ml of water  before and after each feeding.    Regimen provides 1420kcal/day, 60g/day protein and 1124ml/day of free water .    Pt at high refeed risk; recommend monitor potassium, magnesium  and phosphorus labs daily until stable  Thiamine  100mg  daily via tube x 7 days    Daily weights    Check iron /anemia labs   NUTRITION DIAGNOSIS:   Severe Malnutrition related to chronic illness (cancer, COPD, HIV) as evidenced by severe fat depletion, severe muscle depletion, 26 percent weight loss in <1 year.  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  -Pt s/p tracheostomy, open G-tube placement (85F) & left internal jugular port placement 8/13 -Pt planned for bronchoscopy today   Met with pt in room today. Pt is unable to speak but is able to provide history via writing on a pad. Pt reports that she is hungry today. Pt reports that she has not eaten anything for the past three days. Pt reports that she has been having dysphagia for the past couple of months. Per chart, it appears pt has been having dysphagia since May 2024 and was found to have esophageal stenosis which was dilated in October. Pt reports that she has been able to keep some foods down but reports that food and pills get stuck in her throat. Pt has been drinking some  Ensure but not regularly. Pt s/p G-tube placement yesterday. Plan is to initiate bolus tube feeds after bronchoscopy today; will start with 1/3 carton and advance as tolerated. Pt is at high refeed risk.   Pt is anxious to discharge. RD explained to patient today the importance of her remaining in the hospital until her electrolytes are stable and she is able to tolerate her tube feeds at goal rate. Recommend for patient to perform the tube feeds herself so that she will be comfortable with this prior to discharge. Pt will be evaluated for PMSV in the next day or so per SLP. Hopefully patient will be able to take some food via oral intake for pleasure.   Pt with h/o severe iron  deficiency; will check iron /anemia labs. Pt will be followed by the RD at the [facility] after discharge.   Medications reviewed and include: protonix , cefotan , LRS w/ 5% dextrose  @75ml /hr  Labs reviewed: K 4.4 wnl, BUN 42(H), creat 1.02(H), creat 5.9(H), Mg 2.8(H) Wbc- 11.0(H)  NUTRITION - FOCUSED PHYSICAL EXAM:  Flowsheet Row Most Recent Value  Orbital Region Severe depletion  Upper Arm Region Severe depletion  Thoracic and Lumbar Region Severe depletion  Buccal Region Severe depletion  Temple Region Severe depletion  Clavicle Bone Region Severe depletion  Clavicle and Acromion Bone Region Severe depletion  Scapular Bone Region Severe depletion  Dorsal Hand Severe depletion  Patellar Region Severe depletion  Anterior Thigh Region Severe depletion  Posterior Calf Region Severe depletion  Edema (  RD Assessment) None  Hair Reviewed  Eyes Reviewed  Mouth Reviewed  Skin Reviewed  Nails Reviewed   Diet Order:   Diet Order             Diet NPO time specified  Diet effective now                  EDUCATION NEEDS:   Education needs have been addressed  Skin:  Skin Assessment: Reviewed RN Assessment (incisions neck and abdomen)  Last BM:  8/12  Height:   Ht Readings from Last 1 Encounters:   10/02/23 5' 4 (1.626 m)    Weight:   Wt Readings from Last 1 Encounters:  10/03/23 31.2 kg    Ideal Body Weight:  54.5 kg  BMI:  Body mass index is 11.81 kg/m.  Estimated Nutritional Needs:   Kcal:  1200-1400kcal/day  Protein:  60-70g/day  Fluid:  900-117ml/day  Augustin Shams MS, RD, LDN If unable to be reached, please send secure chat to RD inpatient available from 8:00a-4:00p daily

## 2023-10-03 NOTE — Progress Notes (Signed)
 OT Cancellation Note  Patient Details Name: Whitney [name] MRN: [id number] DOB: 10-07-1970   Cancelled Treatment:    Reason Eval/Treat Not Completed: Medical issues which prohibited therapy. Attempted OT eval on this date, pt remains very lethargic post procedure earlier this date. Assist RN with position change and introduced self to daughter who was sitting at pt's bedside. Will re attempt on next available date/time and when pt is medically ready.  Larraine Colas M.S. OTR/L  10/03/23, 4:02 PM

## 2023-10-03 NOTE — Progress Notes (Signed)
 POD #1 AVSS Ding well CXR no ptx, ? Kink, did flush so likely positional  PE NAD Both wound chest wall and abd healing well, G tube in place  A/P Doing well Start TF today No G. Surgery issues May do PO after swallow eval if ok w ENT

## 2023-10-04 ENCOUNTER — Encounter: Payer: Self-pay | Admitting: Otolaryngology

## 2023-10-04 DIAGNOSIS — R918 Other nonspecific abnormal finding of lung field: Secondary | ICD-10-CM

## 2023-10-04 DIAGNOSIS — D649 Anemia, unspecified: Secondary | ICD-10-CM | POA: Diagnosis not present

## 2023-10-04 DIAGNOSIS — C329 Malignant neoplasm of larynx, unspecified: Secondary | ICD-10-CM

## 2023-10-04 DIAGNOSIS — E43 Unspecified severe protein-calorie malnutrition: Secondary | ICD-10-CM | POA: Diagnosis not present

## 2023-10-04 DIAGNOSIS — J449 Chronic obstructive pulmonary disease, unspecified: Secondary | ICD-10-CM | POA: Diagnosis not present

## 2023-10-04 DIAGNOSIS — E785 Hyperlipidemia, unspecified: Secondary | ICD-10-CM

## 2023-10-04 DIAGNOSIS — C3412 Malignant neoplasm of upper lobe, left bronchus or lung: Secondary | ICD-10-CM

## 2023-10-04 DIAGNOSIS — C760 Malignant neoplasm of head, face and neck: Secondary | ICD-10-CM | POA: Diagnosis not present

## 2023-10-04 DIAGNOSIS — B20 Human immunodeficiency virus [HIV] disease: Secondary | ICD-10-CM | POA: Diagnosis not present

## 2023-10-04 DIAGNOSIS — I10 Essential (primary) hypertension: Secondary | ICD-10-CM | POA: Insufficient documentation

## 2023-10-04 LAB — RENAL FUNCTION PANEL
Albumin: 2.1 g/dL — ABNORMAL LOW (ref 3.5–5.0)
Anion gap: 5 (ref 5–15)
BUN: 46 mg/dL — ABNORMAL HIGH (ref 6–20)
CO2: 32 mmol/L (ref 22–32)
Calcium: 10 mg/dL (ref 8.9–10.3)
Chloride: 105 mmol/L (ref 98–111)
Creatinine, Ser: 0.9 mg/dL (ref 0.44–1.00)
GFR, Estimated: >60 mL/min (ref >60)
Glucose, Bld: 98 mg/dL (ref 70–99)
Phosphorus: 3.8 mg/dL (ref 2.5–4.6)
Potassium: 4.1 mmol/L (ref 3.5–5.1)
Sodium: 142 mmol/L (ref 135–145)

## 2023-10-04 LAB — CBC
HCT: 39.3 % (ref 36.0–46.0)
Hemoglobin: 11.7 g/dL — ABNORMAL LOW (ref 12.0–15.0)
MCH: 25.3 pg — ABNORMAL LOW (ref 26.0–34.0)
MCHC: 29.8 g/dL — ABNORMAL LOW (ref 30.0–36.0)
MCV: 85.1 fL (ref 80.0–100.0)
Platelets: 181 K/uL (ref 150–400)
RBC: 4.62 MIL/uL (ref 3.87–5.11)
RDW: 15.2 % (ref 11.5–15.5)
WBC: 7.9 K/uL (ref 4.0–10.5)
nRBC: 0 % (ref 0.0–0.2)

## 2023-10-04 LAB — HELPER T-LYMPH-CD4 (ARMC ONLY)
% CD 4 Pos. Lymph.: 17.2 % — ABNORMAL LOW (ref 30.8–58.5)
Absolute CD 4 Helper: 138 /uL — ABNORMAL LOW (ref 359–1519)
Basophils Absolute: 0 x10E3/uL (ref 0.0–0.2)
Basos: 0 %
EOS (ABSOLUTE): 0 x10E3/uL (ref 0.0–0.4)
Eos: 0 %
Hematocrit: 42 % (ref 34.0–46.6)
Hemoglobin: 13 g/dL (ref 11.1–15.9)
Immature Grans (Abs): 0.1 x10E3/uL (ref 0.0–0.1)
Immature Granulocytes: 1 %
Lymphocytes Absolute: 0.8 x10E3/uL (ref 0.7–3.1)
Lymphs: 9 %
MCH: 26.2 pg — ABNORMAL LOW (ref 26.6–33.0)
MCHC: 31 g/dL — ABNORMAL LOW (ref 31.5–35.7)
MCV: 85 fL (ref 79–97)
Monocytes Absolute: 0.2 x10E3/uL (ref 0.1–0.9)
Monocytes: 2 %
Neutrophils Absolute: 7.9 x10E3/uL — ABNORMAL HIGH (ref 1.4–7.0)
Neutrophils: 88 %
Platelets: 187 x10E3/uL (ref 150–450)
RBC: 4.97 x10E6/uL (ref 3.77–5.28)
RDW: 14.7 % (ref 11.7–15.4)
WBC: 9 x10E3/uL (ref 3.4–10.8)

## 2023-10-04 LAB — GLUCOSE, CAPILLARY
Glucose-Capillary: 101 mg/dL — ABNORMAL HIGH (ref 70–99)
Glucose-Capillary: 133 mg/dL — ABNORMAL HIGH (ref 70–99)
Glucose-Capillary: 156 mg/dL — ABNORMAL HIGH (ref 70–99)
Glucose-Capillary: 157 mg/dL — ABNORMAL HIGH (ref 70–99)
Glucose-Capillary: 87 mg/dL (ref 70–99)
Glucose-Capillary: 98 mg/dL (ref 70–99)

## 2023-10-04 LAB — IRON AND TIBC
Iron: 13 ug/dL — ABNORMAL LOW (ref 28–170)
Saturation Ratios: 7 % — ABNORMAL LOW (ref 10.4–31.8)
TIBC: 188 ug/dL — ABNORMAL LOW (ref 250–450)
UIBC: 175 ug/dL

## 2023-10-04 LAB — FOLATE: Folate: 4.4 ng/mL — ABNORMAL LOW (ref >5.9)

## 2023-10-04 LAB — MAGNESIUM: Magnesium: 2.5 mg/dL — ABNORMAL HIGH (ref 1.7–2.4)

## 2023-10-04 LAB — SURGICAL PATHOLOGY

## 2023-10-04 LAB — VITAMIN B12: Vitamin B-12: 517 pg/mL (ref 180–914)

## 2023-10-04 LAB — FERRITIN: Ferritin: 64 ng/mL (ref 11–307)

## 2023-10-04 LAB — CYTOLOGY - NON PAP

## 2023-10-04 MED ORDER — FE FUM-VIT C-VIT B12-FA 460-60-0.01-1 MG PO CAPS
1.0000 | ORAL_CAPSULE | Freq: Two times a day (BID) | ORAL | Status: DC
  Filled 2023-10-04: qty 1

## 2023-10-04 MED ORDER — FOLIC ACID 1 MG PO TABS
1.0000 mg | ORAL_TABLET | Freq: Every day | ORAL | Status: DC
  Administered 2023-10-04 – 2023-10-08 (×5): 1 mg
  Filled 2023-10-04 (×5): qty 1

## 2023-10-04 MED ORDER — VITAMIN B-12 100 MCG PO TABS
100.0000 ug | ORAL_TABLET | Freq: Every day | ORAL | Status: DC
  Administered 2023-10-04 – 2023-10-08 (×5): 100 ug
  Filled 2023-10-04 (×4): qty 1

## 2023-10-04 MED ORDER — ATOVAQUONE 750 MG/5ML PO SUSP
1500.0000 mg | Freq: Every day | ORAL | Status: DC
  Administered 2023-10-05 – 2023-10-08 (×4): 1500 mg
  Filled 2023-10-04 (×4): qty 10

## 2023-10-04 MED ORDER — ADULT MULTIVITAMIN W/MINERALS CH
1.0000 | ORAL_TABLET | Freq: Every day | ORAL | Status: DC
  Administered 2023-10-05 – 2023-10-08 (×4): 1
  Filled 2023-10-04 (×4): qty 1

## 2023-10-04 MED ORDER — BICTEGRAVIR-EMTRICITAB-TENOFOV 50-200-25 MG PO TABS
1.0000 | ORAL_TABLET | Freq: Every day | ORAL | Status: DC
  Administered 2023-10-04 – 2023-10-08 (×5): 1 via ORAL
  Filled 2023-10-04 (×5): qty 1

## 2023-10-04 MED ORDER — POLYSACCHARIDE IRON COMPLEX 150 MG PO CAPS
150.0000 mg | ORAL_CAPSULE | Freq: Every day | ORAL | Status: DC
  Administered 2023-10-04 – 2023-10-08 (×5): 150 mg
  Filled 2023-10-04 (×5): qty 1

## 2023-10-04 NOTE — Assessment & Plan Note (Signed)
 Home Zetia has been held for a while due to worsening dysphagia. - Check lipid profile with morning labs.

## 2023-10-04 NOTE — TOC Initial Note (Signed)
 Transition of Care [facility]) - Initial/Assessment Note    Patient Details  Name: Whitney [name] MRN: [id number] Date of Birth: 01-05-1971  Transition of Care [facility]) CM/SW Contact:    Corrie JINNY Ruts, LCSW Phone Number: 10/04/2023, 1:26 PM  Clinical Narrative:                 Chart reviewed. The patient was admitted for laryngeal cancer. The patient was with the nurse. I called the patient son for consult. The patient PCP is Yancy Antone Bergeron, MD. The patient son confirms that the patient lives with him and his wife. The patient son reports that the patient was able to do some task independently but he would assist at times. The patient son reports transporting the patient to medical appointments. Per nurse the patient will need EMS transportation at discharge due to the patient living in an apartment and needs assistance getting up the steps. The patient son confirmed that the patient uses [facility] in [place] and her copays are affordable. The patient son confirms that the patient has never had HH or been in a SNF before.    Barriers to Discharge: Continued Medical Work up   Patient Goals and CMS Choice            Expected Discharge Plan and Services       Living arrangements for the past 2 months: Apartment                                      Prior Living Arrangements/Services Living arrangements for the past 2 months: Apartment Lives with:: Adult Children Patient language and need for interpreter reviewed:: Yes        Need for Family Participation in Patient Care: Yes (Comment)     Criminal Activity/Legal Involvement Pertinent to Current Situation/Hospitalization: No - Comment as needed  Activities of Daily Living   ADL Screening (condition at time of admission) Independently performs ADLs?: Yes (appropriate for developmental age) Is the patient deaf or have difficulty hearing?: No Does the patient have difficulty seeing, even when wearing glasses/contacts?:  No Does the patient have difficulty concentrating, remembering, or making decisions?: No  Permission Sought/Granted   Permission granted to share information with : Yes, Release of Information Signed        Permission granted to share info w Relationship: Son  Permission granted to share info w Contact Information: Joey [phone])  Emotional Assessment         Alcohol / Substance Use: Not Applicable Psych Involvement: No (comment)  Admission diagnosis:  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] Patient Active Problem List   Diagnosis Date Noted   Anemia    Hypertension    Lung mass    Hyperlipidemia    Protein-calorie malnutrition, severe 10/03/2023   Laryngeal cancer (HCC) 10/02/2023   Head and neck cancer (HCC) 10/02/2023   Other dysphagia 12/13/2022   Acute respiratory failure with hypoxia (HCC) 10/02/2014   Acute bronchitis 10/02/2014   HIV disease (HCC) 10/02/2014   Malnutrition of moderate degree (HCC) 10/01/2014   COPD (chronic obstructive pulmonary disease) (HCC) 09/30/2014   PNA (pneumonia) 09/11/2014   Chronic pain of multiple joints 06/03/2012   Depression 06/03/2012   GERD (gastroesophageal reflux disease) 06/03/2012   Tobacco dependence 06/03/2012   Chronic pain of multiple joints 06/03/2012   Depression 06/03/2012   GERD (gastroesophageal reflux disease) 06/03/2012  Tobacco dependence 06/03/2012   Excessive and frequent menstruation 10/31/2010   Excessive and frequent menstruation 10/31/2010   Psoriasis and similar disorder 01/02/2010   Psoriasis and similar disorder 01/02/2010   PCP:  Ernie Yancy Roof, MD Pharmacy:   [facility] [phone] [facility] [place] - [address] AT [facility] [address] [facility] [place] [address] Phone: [phone] Fax: [phone]     Social Drivers of Health (SDOH) Social History: SDOH Screenings   Food Insecurity: No Food  Insecurity (10/02/2023)  Housing: Low Risk  (10/02/2023)  Transportation Needs: No Transportation Needs (10/02/2023)  Recent Concern: Transportation Needs - Unmet Transportation Needs (09/06/2023)  Utilities: Not At Risk (10/02/2023)  Depression (PHQ2-9): Medium Risk (09/06/2023)  Financial Resource Strain: Medium Risk (09/09/2023)  Tobacco Use: High Risk (10/02/2023)   SDOH Interventions:     Readmission Risk Interventions     No data to display

## 2023-10-04 NOTE — Anesthesia Postprocedure Evaluation (Signed)
 Anesthesia Post Note  Patient: Whitney [name]  Procedure(s) Performed: CREATION, TRACHEOSTOMY (Neck) CREATION, GASTROSTOMY, OPEN (Abdomen) INSERTION, TUNNELED CENTRAL VENOUS DEVICE, WITH PORT (Left: Chest)  Anesthesia Type: General Anesthetic complications: no   There were no known notable events for this encounter.   Last Vitals:  Vitals:   10/04/23 0500 10/04/23 0600  BP: 95/71 105/83  Pulse: (!) 55 66  Resp: 12 (!) 21  Temp:    SpO2: 99% (!) 89%    Last Pain:  Vitals:   10/04/23 0645  TempSrc:   PainSc: 4                  Alfrieda LILLETTE Lan

## 2023-10-04 NOTE — Assessment & Plan Note (Signed)
 Patient was not taking her HIV medication for some time due to worsening dysphagia secondary to laryngeal cancer.  CD4 count and BAL cultures are pending. - ID was consulted to restart HIV medications as it has to be optimized before concentration of any oncologic treatment. - Patient is high risk for opportunistic  infections reconstitution syndrome.

## 2023-10-04 NOTE — Hospital Course (Addendum)
 53yo with h/o HIV, COPD, hypertension, hyperlipidemia, malnutrition, PTSD, stroke, anxiety, and depression who presented on 10/02/23 for elective Tracheostomy, G-tube, and Chest port placement due to metastatic head and neck SCC. Post procedure she was transferred to Stepdown/ICU for close monitoring. Tracheostomy and bronchoscopy with BAL and biopsy were done on 10/03/2023.  Patient with very poor long-term prognosis.  Palliative care was also consulted.

## 2023-10-04 NOTE — Assessment & Plan Note (Signed)
 Patient with severe cachexia, significant recent weight loss which seems multifactorial with her untreated HIV and new diagnosis of metastatic malignancy.  She was having significant dysphagia and unable to eat.  S/p PEG tube placement and she was started on tube feed. - High risk for refeeding syndrome -Continue with tube feed as recommended by dietitian Monitor electrolytes closely-

## 2023-10-04 NOTE — Progress Notes (Signed)
 NAME:  Whitney [name], MRN:  [id number], DOB:  1970-11-04, LOS: 2 ADMISSION DATE:  10/02/2023, CONSULTATION DATE:  10/02/2023 REFERRING MD:  Dr. Blair, CHIEF COMPLAINT:  Post-op management of Tracheostomy, medical management   Brief Pt Description / Synopsis:  53 y.o. female with PMHx most significant for COPD and HIV, presented for elective Tracheostomy for airway protection due to metastatic Head and Neck Cancer, along with G-tube and Chest port placement.    History of Present Illness:  Whitney [name] is a 53 y.o. female with a past medical history of HIV, COPD, hypertension, hyperlipidemia, malnutrition, PTSD, stroke, anxiety, and depression who presents to [facility] on 10/02/23 for elective Tracheostomy, G-tube, and Chest port placement due to metastatic head and neck cancer.   She recently presented to the emergency room with increasing shortness of breath, along with significant amount of weight loss greater than 30 pounds as well as difficulty swallowing recently.  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   Post procedure she was transferred to Stepdown/ICU for close monitoring.  PCCM asked to consult for medical management post procedure.  Please see Significant Hospital Events section below for full detailed hospital course.  10/03/23- patient s/p tracheostomy.  She reports non compliance with HIV medications for quite some time. She is at risk for AIDS/opportunistic infections.  It is important to rule out infections to avoid Immune reconstitution syndrome prior to initiation of HIV therapy. I discussed obtaining bronchoscopy with airway inspection  and bal.  We will plan to obtain cytology from LUL lesion if this is something that can be done with just regular flexible bronch. Oncologist has reviewed care plan and also recommends to have simultaneous cytology for cancer workup done to r/o lung secondary and provide correct  therapy.  10/04/23- patient with CD4 count of 138 at risk of opportunistic infections. 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  ID consult in progress.   Pertinent  Medical History   Past Medical History:  Diagnosis Date   Anemia    Anxiety    COPD (chronic obstructive pulmonary disease) (HCC)    Depression    GERD (gastroesophageal reflux disease)    History of methicillin resistant staphylococcus aureus (MRSA) 2010   HIV disease (HCC)    dx in 2010   Hyperlipidemia    Hypertension    Lung mass    Malnutrition (HCC)    Marijuana use    Neoplasm of larynx    Pneumonia    Psoriasis    PTSD (post-traumatic stress disorder)    Stroke (HCC)    Tobacco dependence    Vapes nicotine  containing substance     Micro Data:  8/13: MRSA PCR>>  Antimicrobials:   Anti-infectives (From admission, onward)    Start     Dose/Rate Route Frequency Ordered Stop   10/02/23 2200  cefoTEtan  (CEFOTAN ) 2 g in sodium chloride  0.9 % 100 mL IVPB        2 g 200 mL/hr over 30 Minutes Intravenous Every 12 hours 10/02/23 1515 10/03/23 1402   10/02/23 0600  cefoTEtan  (CEFOTAN ) 2 g in sodium chloride  0.9 % 100 mL IVPB        2 g 200 mL/hr over 30 Minutes Intravenous On call to O.R. 10/01/23 2240 10/02/23 1300       Significant Hospital Events: Including procedures, antibiotic start and stop dates in addition to other pertinent events  8/13: Presented for elective Tracheostomy placement, open Gastrostomy tube placement, and chest port placement due to metastatic head and neck cancer.  Transferred to Stepdown post procedure, PCCM consulted to assist with management.  Interim History / Subjective:  As outlined above under Significant Hospital Events section  Objective   Blood pressure 105/83, pulse 60, temperature 97.7 F (36.5 C), temperature source Oral, resp. rate 20, height 5' 4 (1.626 m), weight 31.2 kg, last menstrual period 08/29/2014, SpO2 100%.    Vent Mode: PRVC FiO2 (%):   [28 %-30 %] 28 % Set Rate:  [18 bmp] 18 bmp Vt Set:  [420 mL] 420 mL PEEP:  [5 cmH20] 5 cmH20   Intake/Output Summary (Last 24 hours) at 10/04/2023 1340 Last data filed at 10/04/2023 1052 Gross per 24 hour  Intake 2033.32 ml  Output 150 ml  Net 1883.32 ml   Filed Weights   10/02/23 1819 10/03/23 0702  Weight: 30.8 kg 31.2 kg    Examination: General: Acute on chronically ill appearing cachectic frail female, laying in bed, anxious, in NAD HENT: Atraumatic, normocephalic, neck supple, newly placed Tracheostomy in place midline Lungs: Clear breath sounds throughout, even, non-labored Cardiovascular: RRR, no M/R/G Abdomen: Soft, tender to palpation, midline abdominal abdominal dermabond incision clean, dry, intact, and well approximated with G-tube Extremities: Cachectic, no defotmities, no edema, no cyanosis, good peripheral perfusion Neuro: Awake and alert GU: Deferred  Resolved Hospital Problem list     Assessment & Plan:   #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 Parris  #HIV   - medication noncompliance    - possible AIDS   - ruling out opportunistic inectious etiology -Monitor fever curve -Trend WBC's  -Follow cultures as above -It is reported she does not take her Biktarvy  nor Triumeq  #Anxiety #Depression PMHx: Stroke -Treatment of metabolic derangements as outlined above -Provide supportive care -Promote normal sleep/wake cycle and family presence -Avoid sedating medications as able -Pain control -Prn Valium  for anxiety     Best Practice  (right click and Reselect all SmartList Selections daily)   Diet/type: NPO until G-tube cleared for use by Surgery (may use in am on 8/14) DVT prophylaxis: SCD (may resume chemical DVT ppx on 8/14 per Surgery) GI prophylaxis: PPI Lines: N/A Foley:  N/A Code Status:  full code Last date of multidisciplinary goals of care discussion [N/A]   Labs   CBC: Recent Labs  Lab 10/02/23 1558 10/02/23 1852 10/03/23 0544 10/04/23 0802  WBC 5.9 9.0 11.0* 7.9  NEUTROABS  --  7.9*  --   --   HGB 13.3 13.0 12.9 11.7*  HCT 43.5 42.0 43.2 39.3  MCV 85.5 85 85.7 85.1  PLT 172 187 174 181    Basic Metabolic Panel: Recent Labs  Lab 10/02/23 1558 10/03/23 0544 10/04/23 0802  NA 144 142 142  K 4.8 4.4 4.1  CL 101 101 105  CO2 34* 33* 32  GLUCOSE 112* 65* 98  BUN 34* 42* 46*  CREATININE 0.99 1.02* 0.90  CALCIUM 11.8* 11.3* 10.0  MG  --  2.8* 2.5*  PHOS 5.0* 5.9* 3.8   GFR: Estimated Creatinine  Clearance: 34.8 mL/min (by C-G formula based on SCr of 0.9 mg/dL). Recent Labs  Lab 10/02/23 1558 10/02/23 1852 10/03/23 0544 10/04/23 0802  WBC 5.9 9.0 11.0* 7.9    Liver Function Tests: Recent Labs  Lab 10/02/23 1558 10/03/23 0544 10/04/23 0802  ALBUMIN 2.7* 2.6* 2.1*   No results for input(s): LIPASE, AMYLASE in the last 168 hours. No results for input(s): AMMONIA in the last 168 hours.  ABG No results found for: PHART, PCO2ART, PO2ART, HCO3, TCO2, ACIDBASEDEF, O2SAT   Coagulation Profile: No results for input(s): INR, PROTIME in the last 168 hours.  Cardiac Enzymes: No results for input(s): CKTOTAL, CKMB, CKMBINDEX, TROPONINI in the last 168 hours.  HbA1C: No results found for: HGBA1C  CBG: Recent Labs  Lab 10/03/23 2059 10/04/23 0019 10/04/23 0325 10/04/23 0756 10/04/23 1133  GLUCAP 130* 157* 101* 98 156*    Review of Systems:   Unable to assess due to Tracheostomy    Past Medical History:  She,  has a past medical  history of Anemia, Anxiety, COPD (chronic obstructive pulmonary disease) (HCC), Depression, GERD (gastroesophageal reflux disease), History of methicillin resistant staphylococcus aureus (MRSA) (2010), HIV disease (HCC), Hyperlipidemia, Hypertension, Lung mass, Malnutrition (HCC), Marijuana use, Neoplasm of larynx, Pneumonia, Psoriasis, PTSD (post-traumatic stress disorder), Stroke (HCC), Tobacco dependence, and Vapes nicotine  containing substance.   Surgical History:   Past Surgical History:  Procedure Laterality Date   CESAREAN SECTION     CREATION, GASTROSTOMY, OPEN N/A 10/02/2023   Procedure: CREATION, GASTROSTOMY, OPEN;  Surgeon: Jordis Laneta FALCON, MD;  Location: [facility] ORS;  Service: General;  Laterality: N/A;  G-tube in place   ESOPHAGOGASTRODUODENOSCOPY (EGD) WITH PROPOFOL  N/A 12/13/2022   Procedure: ESOPHAGOGASTRODUODENOSCOPY (EGD) WITH PROPOFOL ;  Surgeon: Therisa Bi, MD;  Location: [facility] ENDOSCOPY;  Service: Gastroenterology;  Laterality: N/A;   PORTACATH PLACEMENT Left 10/02/2023   Procedure: INSERTION, TUNNELED CENTRAL VENOUS DEVICE, WITH PORT;  Surgeon: Jordis Laneta FALCON, MD;  Location: [facility] ORS;  Service: General;  Laterality: Left;   TRACHEOSTOMY TUBE PLACEMENT N/A 10/02/2023   Procedure: CREATION, TRACHEOSTOMY;  Surgeon: Blair Mt, MD;  Location: [facility] ORS;  Service: ENT;  Laterality: N/A;  AWAKE TRACHEOSTOMY   TUBAL LIGATION       Social History:   reports that she has been smoking cigarettes. She has a 18 pack-year smoking history. She has never used smokeless tobacco. She reports that she does not currently use alcohol. She reports current drug use. Drug: Marijuana.   Family History:  Her family history includes CAD in her father; Hypertension in her mother; Parkinson's disease in her father.   Allergies Allergies  Allergen Reactions   Penicillins Hives   Aspirin Rash and Other (See Comments)    Reaction:  GI upset      Home Medications  Prior to Admission medications    Medication Sig Start Date End Date Taking? Authorizing Provider  acetaminophen  (TYLENOL  CHILDRENS) 160 MG/5ML suspension Take 15.6 mLs (500 mg total) by mouth every 6 (six) hours as needed. 09/01/23  Yes Clarine Ozell LABOR, MD  albuterol  (ACCUNEB ) 1.25 MG/3ML nebulizer solution Take 3 mLs (1.25 mg total) by nebulization every 6 (six) hours as needed for wheezing. 09/01/23  Yes Clarine Ozell LABOR, MD  alum & mag hydroxide-simeth (MAALOX/MYLANTA) 200-200-20 MG/5ML suspension Take 15 mLs by mouth every 6 (six) hours as needed for indigestion or heartburn.   Yes [provider]  feeding supplement, ENSURE ENLIVE, (ENSURE ENLIVE) LIQD Take 237 mLs by mouth 2 (two) times daily between  meals. 10/02/14  Yes Vaickute, Rima, MD  Fluticasone -Salmeterol (ADVAIR) 250-50 MCG/DOSE AEPB Inhale 1 puff into the lungs 2 (two) times daily.   Yes [provider]  lidocaine  (XYLOCAINE ) 2 % solution Use as directed 15 mLs in the mouth or throat as needed for mouth pain. 09/01/23  Yes Clarine Ozell LABOR, MD  LORazepam  (ATIVAN ) 0.5 MG tablet Take 0.5 tablets (0.25 mg total) by mouth every 8 (eight) hours as needed for anxiety. 09/23/23  Yes Jacobo Evalene PARAS, MD  Morphine  Sulfate (MORPHINE  CONCENTRATE) 10 mg / 0.5 ml concentrated solution Take 0.25 mLs (5 mg total) by mouth every 6 (six) hours as needed. 09/06/23  Yes Borders, Fonda SAUNDERS, NP  ondansetron  (ZOFRAN -ODT) 4 MG disintegrating tablet Take 1 tablet (4 mg total) by mouth every 8 (eight) hours as needed for nausea or vomiting. 09/23/23  Yes Jacobo Evalene PARAS, MD  Phenylephrine -DM-GG-APAP 5-10-200-325 MG/15ML LIQD Take 15 mLs by mouth daily as needed (allergies).   Yes [provider]  tiotropium (SPIRIVA ) 18 MCG inhalation capsule Place 18 mcg into inhaler and inhale daily.   Yes [provider]  triamcinolone ointment (KENALOG) 0.1 % Apply 1 Application topically daily as needed (irritation). 03/30/21  Yes [provider]   abacavir -dolutegravir -lamiVUDine  (TRIUMEQ) 600-50-300 MG tablet Take 1 tablet by mouth daily. Patient not taking: Reported on 10/01/2023    [provider]  bictegravir-emtricitabine -tenofovir  AF (BIKTARVY ) 50-200-25 MG TABS tablet Take 1 tablet by mouth daily. Patient not taking: Reported on 10/01/2023    [provider]  ezetimibe (ZETIA) 10 MG tablet Take 10 mg by mouth daily. 04/06/21   [provider]  famotidine  (PEPCID ) 20 MG tablet Take 1 tablet (20 mg total) by mouth daily. 09/01/23 10/01/23  Clarine Ozell LABOR, MD  ferrous sulfate  325 (65 FE) MG tablet Take 1 tablet (325 mg total) by mouth 2 (two) times daily with a meal. Patient not taking: Reported on 12/06/2022 01/30/16   Edelmiro Leash, MD  levofloxacin  (LEVAQUIN ) 750 MG tablet Take 1 tablet (750 mg total) by mouth daily. Patient not taking: Reported on 09/27/2023 09/12/14   Barbette Cea, MD  methylPREDNISolone  (MEDROL  DOSEPAK) 4 MG TBPK tablet follow package directions Patient not taking: Reported on 12/06/2022 10/02/14   Vaickute, Rima, MD  naloxone  (NARCAN ) nasal spray 4 mg/0.1 mL SPRAY 1 SPRAY INTO ONE NOSTRIL AS DIRECTED FOR OPIOID OVERDOSE (TURN PERSON ON SIDE AFTER DOSE. IF NO RESPONSE IN 2-3 MINUTES OR PERSON RESPONDS BUT RELAPSES, REPEAT USING A NEW SPRAY DEVICE AND SPRAY INTO THE OTHER NOSTRIL. CALL 911 AFTER USE.) * EMERGENCY USE ONLY * Patient not taking: No sig reported 09/06/23   Borders, Fonda SAUNDERS, NP  pantoprazole  (PROTONIX ) 40 MG tablet Take 1 tablet (40 mg total) by mouth 2 (two) times daily. Patient not taking: No sig reported 12/13/22 12/13/23  Therisa Bi, MD     Critical care provider statement:   Total critical care time: 33 minutes   Performed by: Parris MD   Critical care time was exclusive of separately billable procedures and treating other patients.   Critical care was necessary to treat or prevent imminent or life-threatening deterioration.   Critical care was time spent  personally by me on the following activities: development of treatment plan with patient and/or surrogate as well as nursing, discussions with consultants, evaluation of patient's response to treatment, examination of patient, obtaining history from patient or surrogate, ordering and performing treatments and interventions, ordering and review of laboratory studies, ordering and review of  radiographic studies, pulse oximetry and re-evaluation of patient's condition.    Lucille Crichlow, M.D.  Pulmonary & Critical Care Medicine

## 2023-10-04 NOTE — Consult Note (Signed)
 NAME: Whitney [name]  DOB: [birth year]  MRN: [id number]  Date/Time: 10/04/2023 12:50 PM  REQUESTING PROVIDER: Dr. Dolly Subjective:  REASON FOR CONSULT: HIV ? Whitney [name] is a 53 y.o. female with a history of COPD, smoker, HIV, laryngeal mass and left hilar mass and by lymph node biopsy confirmed squamous cell carcinoma of the larynx.   was was admitted for an elective tracheostomy, port placement and PEG placement.  She has severe dysphagia.  I am asked to see the patient because of her HIV As per patient she was diagnosed with HIV in 2014 when she became  ill and psoriasis flared up.  She was seen at [facility].  Their records are from 2014.  She was on darunavir Norvir and Truvada at one point.  Then she was on Triumeq. In March 2016 her CD4 count was 680 and viral load was undetectable.  She transferred her care to [facility] in 2016.  she is f on Biktarvy .  Her last prescription for Biktarvy  was refilled on 08/20/2023. Patient has had trouble swallowing Biktarvy .  She has been inconsistent with the medication though she says 3 days it looks like the full past 2 months She says she has tried to dissolve it in water  and has taken it.  I asked the daughter who was at bedside for a pill count. Her last viral load from May 2025 was 50 (anything less than 200 is considered undetectable) and a CD count was 339 with 21%.  Patient has lost 30 pounds in the past 3 months. She is a current smoker She  does not give any history of opportunistic infections.    10/02/23 10:24  BP 131/111 (H)  Temp 97.2 F (36.2 C) !  Pulse Rate 84  Resp 14  SpO2 98 %     Latest Reference Range & Units 10/02/23 15:58  WBC 4.0 - 10.5 K/uL 5.9  Hemoglobin 12.0 - 15.0 g/dL 86.6  HCT 63.9 - 53.9 % 43.5  Platelets 150 - 400 K/uL 172  Creatinine 0.44 - 1.00 mg/dL 9.00   On 1/86/7974 she underwent tracheostomy.  She also underwent gastrostomy with G-tube placement t as well as port placement on  10/02/2023.  She had a bronchoscopy to obtain cytology from left upper lobe lesion and also to rule out any opportunistic infection.  The lung cytology came back as squamous cell carcinoma of the left upper lobe. Past Medical History:  Diagnosis Date   Anemia    Anxiety    COPD (chronic obstructive pulmonary disease) (HCC)    Depression    GERD (gastroesophageal reflux disease)    History of methicillin resistant staphylococcus aureus (MRSA) 2010   HIV disease (HCC)    dx in 2010   Hyperlipidemia    Hypertension    Lung mass    Malnutrition (HCC)    Marijuana use    Neoplasm of larynx    Pneumonia    Psoriasis    PTSD (post-traumatic stress disorder)    Stroke (HCC)    Tobacco dependence    Vapes nicotine  containing substance     Past Surgical History:  Procedure Laterality Date   CESAREAN SECTION     CREATION, GASTROSTOMY, OPEN N/A 10/02/2023   Procedure: CREATION, GASTROSTOMY, OPEN;  Surgeon: Jordis Laneta FALCON, MD;  Location: [facility] ORS;  Service: General;  Laterality: N/A;  G-tube in place   ESOPHAGOGASTRODUODENOSCOPY (EGD) WITH PROPOFOL  N/A 12/13/2022   Procedure: ESOPHAGOGASTRODUODENOSCOPY (EGD) WITH PROPOFOL ;  Surgeon: Therisa Bi,  MD;  Location: [facility] ENDOSCOPY;  Service: Gastroenterology;  Laterality: N/A;   PORTACATH PLACEMENT Left 10/02/2023   Procedure: INSERTION, TUNNELED CENTRAL VENOUS DEVICE, WITH PORT;  Surgeon: Jordis Laneta FALCON, MD;  Location: [facility] ORS;  Service: General;  Laterality: Left;   TRACHEOSTOMY TUBE PLACEMENT N/A 10/02/2023   Procedure: CREATION, TRACHEOSTOMY;  Surgeon: Blair Mt, MD;  Location: [facility] ORS;  Service: ENT;  Laterality: N/A;  AWAKE TRACHEOSTOMY   TUBAL LIGATION      Social History   Socioeconomic History   Marital status: Legally Separated    Spouse name: Not on file   Number of children: Not on file   Years of education: Not on file   Highest education level: Not on file  Occupational History   Not on file  Tobacco Use   Smoking status:  Every Day    Current packs/day: 0.50    Average packs/day: 0.5 packs/day for 36.0 years (18.0 ttl pk-yrs)    Types: Cigarettes   Smokeless tobacco: Never   Tobacco comments:    Smokes 10 cigarettes daily- khj 09/11/2023        Started smoking at 53 years old.    Smoked 1 PPD at her heaviest  Vaping Use   Vaping status: Every Day   Substances: Nicotine , Flavoring  Substance and Sexual Activity   Alcohol use: Not Currently    Comment: ocassionally   Drug use: Yes    Types: Marijuana    Comment: daily occ   Sexual activity: Yes    Birth control/protection: Condom, Surgical, Post-menopausal  Other Topics Concern   Not on file  Social History Narrative   Not on file   Social Drivers of Health   Financial Resource Strain: Medium Risk (09/09/2023)   Overall Financial Resource Strain (CARDIA)    Difficulty of Paying Living Expenses: Somewhat hard  Food Insecurity: No Food Insecurity (10/02/2023)   Hunger Vital Sign    Worried About Running Out of Food in the Last Year: Never true    Ran Out of Food in the Last Year: Never true  Transportation Needs: No Transportation Needs (10/02/2023)   PRAPARE - Administrator, Civil Service (Medical): No    Lack of Transportation (Non-Medical): No  Recent Concern: Transportation Needs - Unmet Transportation Needs (09/06/2023)   PRAPARE - Administrator, Civil Service (Medical): Yes    Lack of Transportation (Non-Medical): Yes  Physical Activity: Not on file  Stress: Not on file  Social Connections: Not on file  Intimate Partner Violence: Not At Risk (10/02/2023)   Humiliation, Afraid, Rape, and Kick questionnaire    Fear of Current or Ex-Partner: No    Emotionally Abused: No    Physically Abused: No    Sexually Abused: No    Family History  Problem Relation Age of Onset   Parkinson's disease Father    CAD Father    Hypertension Mother    Allergies  Allergen Reactions   Penicillins Hives   Aspirin Rash and Other  (See Comments)    Reaction:  GI upset    I? Current Facility-Administered Medications  Medication Dose Route Frequency Provider Last Rate Last Admin   0.9 %  sodium chloride  infusion  250 mL Intravenous Continuous Aleskerov, Fuad, MD   Stopped at 10/03/23 1621   Chlorhexidine  Gluconate Cloth 2 % PADS 6 each  6 each Topical Daily Aleskerov, Fuad, MD   6 each at 10/04/23 1053   diazepam  (VALIUM ) injection 2.5-5 mg  2.5-5 mg Intravenous Q6H PRN Rust-Chester, Britton L, NP   2.5 mg at 10/04/23 1010   docusate (COLACE) 50 MG/5ML liquid 100 mg  100 mg Per Tube BID PRN Keene, Jeremiah D, NP       feeding supplement (OSMOLITE 1.5 CAL) liquid 120 mL  120 mL Per Tube QID Parris Manna, MD   120 mL at 10/04/23 1105   [START ON 10/05/2023] feeding supplement (OSMOLITE 1.5 CAL) liquid 237 mL  237 mL Per Tube QID Parris Manna, MD       fluticasone  furoate-vilanterol (BREO ELLIPTA ) 200-25 MCG/ACT 1 puff  1 puff Inhalation Daily Shellia Mann D, NP   1 puff at 10/04/23 0830   free water  100 mL  100 mL Per Tube QID Parris Manna, MD   100 mL at 10/04/23 1052   ipratropium-albuterol  (DUONEB) 0.5-2.5 (3) MG/3ML nebulizer solution 3 mL  3 mL Nebulization Q4H PRN Shellia Mann BIRCH, NP       iron  polysaccharides (NIFEREX) capsule 150 mg  150 mg Per Tube Daily Caleen Qualia, MD   150 mg at 10/04/23 1240   ketorolac  (TORADOL ) 15 MG/ML injection 15 mg  15 mg Intravenous Q6H Pabon, Diego F, MD   15 mg at 10/04/23 1239   lip balm (CARMEX) ointment 1 Application  1 Application Topical PRN Rust-Chester, Jenita CROME, NP       morphine  10 MG/5ML solution 5 mg  5 mg Per Tube Q6H PRN Rust-Chester, Jenita CROME, NP   5 mg at 10/04/23 0327   ondansetron  (ZOFRAN ) injection 4 mg  4 mg Intravenous Q6H PRN Shellia Mann BIRCH, NP       pantoprazole  (PROTONIX ) injection 40 mg  40 mg Intravenous Q24H Keene, Jeremiah D, NP   40 mg at 10/03/23 1619   polyethylene glycol (MIRALAX  / GLYCOLAX ) packet 17 g  17 g Per Tube Daily PRN Keene,  Jeremiah D, NP       thiamine  (VITAMIN B1) tablet 100 mg  100 mg Per Tube Daily Aleskerov, Fuad, MD   100 mg at 10/04/23 1055   umeclidinium bromide  (INCRUSE ELLIPTA ) 62.5 MCG/ACT 1 puff  1 puff Inhalation Daily Shellia Mann D, NP   1 puff at 10/04/23 0830     Abtx:  Anti-infectives (From admission, onward)    Start     Dose/Rate Route Frequency Ordered Stop   10/02/23 2200  cefoTEtan  (CEFOTAN ) 2 g in sodium chloride  0.9 % 100 mL IVPB        2 g 200 mL/hr over 30 Minutes Intravenous Every 12 hours 10/02/23 1515 10/03/23 1402   10/02/23 0600  cefoTEtan  (CEFOTAN ) 2 g in sodium chloride  0.9 % 100 mL IVPB        2 g 200 mL/hr over 30 Minutes Intravenous On call to O.R. 10/01/23 2240 10/02/23 1300       REVIEW OF SYSTEMS:  Const: negative fever, negative chills, positive difficult weight loss Eyes: negative diplopia or visual changes, negative eye pain ENT: Difficulty in swallowing Resp:  cough, shortness of breath  cards: negative for chest pain, palpitations, lower extremity edema GU: negative for frequency, dysuria and hematuria GI: Negative for abdominal pain, diarrhea, bleeding, constipation Skin: negative for rash and pruritus Heme: negative for easy bruising and gum/nose bleeding MS: weakness Neurolo:negative for headaches, dizziness, vertigo, memory problems  Psych: negative for feelings of anxiety, depression  Endocrine: negative for thyroid, diabetes Allergy/Immunology-PCN Objective:  VITALS:  BP 105/83 (BP Location: Left Arm)   Pulse 60  Temp 97.7 F (36.5 C) (Oral)   Resp 20   Ht 5' 4 (1.626 m)   Wt 31.2 kg   LMP 08/29/2014   SpO2 100%   BMI 11.81 kg/m  LDA Port Tracheostomy G-tube PHYSICAL EXAM:  General: Alert, cooperative, no distress, emaciated Writing down answers to questions. Wanting to know when she could go home trachea Head: Normocephalic, without obvious abnormality, atraumatic. Eyes: Conjunctivae clear, anicteric sclerae. Pupils are  equal ENT tracheostomy   Back: No CVA tenderness. Lungs: Bilateral air entry surgical. Heart: Regular rate and rhythm, no murmur, rub or gallop. Abdomen: Soft, small lap surgical site  extremities: atraumatic, no cyanosis. No edema. No clubbing Skin: No rashes or lesions. Or bruising Lymph: Cervical, supraclavicular normal. Neurologic: Grossly non-focal Pertinent Labs Lab Results CBC    Component Value Date/Time   WBC 7.9 10/04/2023 0802   RBC 4.62 10/04/2023 0802   HGB 11.7 (L) 10/04/2023 0802   HCT 39.3 10/04/2023 0802   PLT 181 10/04/2023 0802   MCV 85.1 10/04/2023 0802   MCH 25.3 (L) 10/04/2023 0802   MCHC 29.8 (L) 10/04/2023 0802   RDW 15.2 10/04/2023 0802   LYMPHSABS 1.1 09/01/2023 0743   MONOABS 0.4 09/01/2023 0743   EOSABS 0.0 09/01/2023 0743   BASOSABS 0.0 09/01/2023 0743       Latest Ref Rng & Units 10/04/2023    8:02 AM 10/03/2023    5:44 AM 10/02/2023    3:58 PM  CMP  Glucose 70 - 99 mg/dL 98  65  887   BUN 6 - 20 mg/dL 46  42  34   Creatinine 0.44 - 1.00 mg/dL 9.09  8.97  9.00   Sodium 135 - 145 mmol/L 142  142  144   Potassium 3.5 - 5.1 mmol/L 4.1  4.4  4.8   Chloride 98 - 111 mmol/L 105  101  101   CO2 22 - 32 mmol/L 32  33  34   Calcium 8.9 - 10.3 mg/dL 89.9  88.6  88.1       Microbiology: Recent Results (from the past 240 hours)  MRSA Next Gen by PCR, Nasal     Status: None   Collection Time: 10/02/23  2:57 PM   Specimen: Nasal Mucosa; Nasal Swab  Result Value Ref Range Status   MRSA by PCR Next Gen NOT DETECTED NOT DETECTED Final    Comment: (NOTE) The GeneXpert MRSA Assay (FDA approved for NASAL specimens only), is one component of a comprehensive MRSA colonization surveillance program. It is not intended to diagnose MRSA infection nor to guide or monitor treatment for MRSA infections. Test performance is not FDA approved in patients less than 7 years old. Performed at [facility], [address] [place], [place] [address]    Culture, fungus without smear     Status: None (Preliminary result)   Collection Time: 10/03/23 12:56 PM   Specimen: Bronchoalveolar Lavage; Lung  Result Value Ref Range Status   Specimen Description   Final    BRONCHIAL ALVEOLAR LAVAGE Performed at [facility], [address], [place], [place] [address]    Special Requests   Final    NONE Performed at [facility], [address] [place], [place] [address]    Culture   Final    NO FUNGUS ISOLATED AFTER 1 DAY Performed at [facility], [address], [place], [place] [address]    Report Status PENDING  Incomplete  Culture, BAL-quantitative w Gram  Stain     Status: None (Preliminary result)   Collection Time: 10/03/23 12:57 PM   Specimen: Bronchoalveolar Lavage; Respiratory  Result Value Ref Range Status   Specimen Description   Final    BRONCHIAL ALVEOLAR LAVAGE Performed at [facility], [address] [place], [place] [address]    Special Requests   Final    NONE Performed at [facility], [address] [place], [place] [address]    Gram Stain   Final    FEW WBC PRESENT, PREDOMINANTLY PMN RARE GRAM POSITIVE COCCI    Culture   Final    TOO YOUNG TO READ Performed at [facility], [address], [place], [place] [address]    Report Status PENDING  Incomplete    Lines and Device Date on insertion # of days DC  Central line     Foley     ETT       IMAGING RESULTS:  I have personally reviewed the films ?Left hilar mass   Impression/Recommendation 53 year old female with history of HIV, newly diagnosed head and neck carcinoma and left upper lobe  squamous cell carcinoma   HIV :was on HAART  consistently until 2 months ago.  Her last viral load was 50 and CD4 was 339 with 21% in May 2025. For the past 2 months she has been trying to dissolve the Biktarvy  due to dysphagia but  she has been inconsistent Her current CD4 is 138 with 17% from 10/02/2023 The  concern is whether she would get opportunistic infection versus IRIS if we restart  HAART . That is less likely but  remotely  possible Will send HIV RNA, genotype  and beta D glucan Will start Biktarvy .  Will dissolve it in water  and give it through the PEG. 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  Has had empiric tracheostomy and G-tube placement Followed by oncology  Squamous cell carcinoma of the left lung.  Newly diagnosed  Significant weight loss secondary to malignancy  COPD. Current smoker  Discussed the management in great detail with the patient and her daughter and the care team.  Will monitor her closely -She will follow-up with [facility] with Noretta Mages  ________________________________________________ ID will not see her this weekend.  On-call physician available by phone for urgent issues.  Call if needed Note:  This document was prepared using Dragon voice recognition software and may include unintentional dictation errors.

## 2023-10-04 NOTE — Evaluation (Signed)
 Physical Therapy Evaluation Patient Details Name: Whitney [name] MRN: [id number] DOB: [birth year] Today's Date: 10/04/2023  History of Present Illness  Pt is a 53 y.o. female s/p elective tracheostomy, internal jugular port placement, and open gastrostomy procedure 10/02/23.  S/p bronchoscopy 10/03/23.  PMH includes COPD, htn, anxiety, HIV positive, PNA, PTSD, stroke, L upper lobe mass and laryngeal mass (per notes L neck mass biopsied c/w head and neck CA metastatic SCC).  Clinical Impression  PT/OT co-evaluation performed.  Pt resting in bed (long sitting) upon therapy arrival; pt agreeable to therapy; pt using pen/paper method to communicate as well as gestures; pt reporting 8/10 abdominal pain (nurse notified).  Pt with difficulty communicating prior level of function and some of home set up information.  Per recent notes in pt's record, pt's daughter reported pt was unable to ambulate independently anymore and had requested a walker and w/c for pt just prior to this hospitalization.  Pt reports living on 3rd floor of apt (no elevator access).  Currently pt is SBA with bed mobility (assist for lines/leads) and CGA with sit to stand and taking steps in place with RW use (SBA of 2nd for lines/leads; limited ambulation d/t lines/leads).  Pt would currently benefit from skilled PT to address noted impairments and functional limitations (see below for any additional details).  Upon hospital discharge, pt would benefit from ongoing therapy.  Impaired activity tolerance noted as well as generalized weakness.  Anticipate pt will need assist for accessing home (in/out) d/t significant number of steps reported (would benefit from looking into options for this--TOC notified).    If plan is discharge home, recommend the following: A little help with walking and/or transfers;A little help with bathing/dressing/bathroom;Assistance with cooking/housework;Assist for transportation;Help with stairs or ramp for entrance    Can travel by private vehicle        Equipment Recommendations Rolling walker (2 wheels);BSC/3in1;Wheelchair (measurements PT);Wheelchair cushion (measurements PT)  Recommendations for Other Services       Functional Status Assessment Patient has had a recent decline in their functional status and demonstrates the ability to make significant improvements in function in a reasonable and predictable amount of time.     Precautions / Restrictions Precautions Precautions: Fall Recall of Precautions/Restrictions: Intact Precaution/Restrictions Comments: Trach; gastrostomy, chest port; NPO Restrictions Weight Bearing Restrictions Per Provider Order: No      Mobility  Bed Mobility Overal bed mobility: Needs Assistance Bed Mobility: Supine to Sit, Sit to Supine     Supine to sit: Supervision, HOB elevated Sit to supine: Supervision, HOB elevated   General bed mobility comments: SBA for lines/leads    Transfers Overall transfer level: Needs assistance Equipment used: Rolling walker (2 wheels) Transfers: Sit to/from Stand Sit to Stand: Contact guard assist, +2 safety/equipment           General transfer comment: 2nd assist for lines/leads; x3 trials standing; fairly strong stand noted    Ambulation/Gait Ambulation/Gait assistance: Contact guard assist, +2 safety/equipment Gait Distance (Feet):  (x10 steps B LE's in place) Assistive device: Rolling walker (2 wheels)         General Gait Details: 2nd assist for lines/leads; limited mobility d/t lines/leads; steady with RW use  Stairs            Wheelchair Mobility     Tilt Bed    Modified Rankin (Stroke Patients Only)       Balance Overall balance assessment: Needs assistance Sitting-balance support: No upper extremity supported, Feet supported Sitting balance-Leahy  Scale: Fair Sitting balance - Comments: steady static sitting although pt suddenly leaning backwards at times (appearing impulsive at  times) requiring assist for safety   Standing balance support: Bilateral upper extremity supported, Reliant on assistive device for balance Standing balance-Leahy Scale: Fair Standing balance comment: steady static sitting with B UE support on RW                             Pertinent Vitals/Pain Pain Assessment Pain Assessment: 0-10 Pain Score: 8  Pain Location: abdomen (near incision and gastrostomy tube) Pain Intervention(s): Limited activity within patient's tolerance, Monitored during session, Repositioned, Patient requesting pain meds-RN notified HR 56-69 bpm and SpO2 sats 96% or greater on 6 L O2 via trach collar during session.    Home Living Family/patient expects to be discharged to:: Private residence Living Arrangements: Children (Pt's son house) Available Help at Discharge: Family;Available 24 hours/day (3 family members assisting (24/7 assist between them)) Type of Home: Apartment Home Access: Stairs to enter (3 flights (no elevator)) Entrance Stairs-Rails: Right;Left;Can reach both Entrance Stairs-Number of Steps: 3 flights of stairs (38 steps)   Home Layout: One level   Additional Comments: Per chart review (notes 10/01/23), walker and w/c was ordered for pt just prior to admission (unsure if pt has received it yet)    Prior Function               Mobility Comments: Per chart pt was unable to ambulate independently anymore so walker and w/c was ordered for pt just prior to admission.  Pt with difficulty describing (via pen/paper communication).       Extremity/Trunk Assessment   Upper Extremity Assessment Upper Extremity Assessment: Defer to OT evaluation    Lower Extremity Assessment Lower Extremity Assessment: Generalized weakness    Cervical / Trunk Assessment Cervical / Trunk Assessment: Normal  Communication   Communication Communication: Impaired Factors Affecting Communication: Trach/intubated    Cognition Arousal:  Alert Behavior During Therapy: Impulsive                           PT - Cognition Comments: A&Ox4 (via pen/paper communication) although pt demonstrating difficulty with clear sentences/communication via paper intermittently Following commands: Impaired Following commands impaired: Follows one step commands inconsistently     Cueing Cueing Techniques: Verbal cues, Visual cues     General Comments General comments (skin integrity, edema, etc.): mild drainage noted abdominal gastrostomy dressing (no change during session); no drainage noted abdominal incision; pink/red drainage noted from trach site during session (nurse notified)--pt's gown changed    Exercises     Assessment/Plan    PT Assessment Patient needs continued PT services  PT Problem List Decreased strength;Decreased activity tolerance;Decreased balance;Decreased mobility;Decreased cognition;Decreased knowledge of use of DME;Decreased safety awareness;Decreased knowledge of precautions;Decreased skin integrity;Pain       PT Treatment Interventions DME instruction;Gait training;Stair training;Functional mobility training;Therapeutic activities;Therapeutic exercise;Balance training;Patient/family education;Wheelchair mobility training    PT Goals (Current goals can be found in the Care Plan section)  Acute Rehab PT Goals Patient Stated Goal: to go home PT Goal Formulation: With patient Time For Goal Achievement: 10/18/23 Potential to Achieve Goals: Good    Frequency Min 2X/week     Co-evaluation PT/OT/SLP Co-Evaluation/Treatment: Yes Reason for Co-Treatment: For patient/therapist safety;To address functional/ADL transfers PT goals addressed during session: Mobility/safety with mobility;Balance;Proper use of DME OT goals addressed during session: ADL's and self-care  AM-PAC PT 6 Clicks Mobility  Outcome Measure Help needed turning from your back to your side while in a flat bed without using  bedrails?: None Help needed moving from lying on your back to sitting on the side of a flat bed without using bedrails?: A Little Help needed moving to and from a bed to a chair (including a wheelchair)?: A Little Help needed standing up from a chair using your arms (e.g., wheelchair or bedside chair)?: A Little Help needed to walk in hospital room?: A Little Help needed climbing 3-5 steps with a railing? : A Lot 6 Click Score: 18    End of Session Equipment Utilized During Treatment: Oxygen (6 L via trach collar) Activity Tolerance: Patient tolerated treatment well Patient left: in bed;with call bell/phone within reach;with bed alarm set Nurse Communication: Mobility status;Patient requests pain meds;Precautions;Other (comment) (Pt's trach site and abdominal drainage) PT Visit Diagnosis: Other abnormalities of gait and mobility (R26.89);Muscle weakness (generalized) (M62.81);Pain Pain - part of body:  (abdomen)    Time: 9150-9070 PT Time Calculation (min) (ACUTE ONLY): 40 min   Charges:   PT Evaluation $PT Eval Low Complexity: 1 Low PT Treatments $Therapeutic Activity: 8-22 mins PT General Charges $$ ACUTE PT VISIT: 1 Visit        Damien Caulk, PT 10/04/23, 10:17 AM

## 2023-10-04 NOTE — Addendum Note (Signed)
 Addendum  created 10/04/23 0734 by Gillermo Spruce I, CRNA   Clinical Note Signed

## 2023-10-04 NOTE — Assessment & Plan Note (Signed)
 No current wheezing.  S/p tracheostomy for advanced laryngeal cancer. - Continue with bronchodilators

## 2023-10-04 NOTE — Progress Notes (Signed)
 Progress Note   Patient: Whitney [name] FMW:[id number] DOB: 05/10/1970 DOA: 10/02/2023     2 DOS: the patient was seen and examined on 10/04/2023   Brief hospital course: PCCM transfer on 10/04/2023.  Taken from prior notes.  Whitney [name] is a 53 y.o. female with a past medical history of HIV, COPD, hypertension, hyperlipidemia, malnutrition, PTSD, stroke, anxiety, and depression who presents to [facility] on 10/02/23 for elective Tracheostomy, G-tube, and Chest port placement due to metastatic head and neck cancer.    She recently presented to the emergency room with increasing shortness of breath, along with significant amount of weight loss greater than 30 pounds as well as difficulty swallowing recently.  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   Post procedure she was transferred to Stepdown/ICU for close monitoring.   Tracheostomy and bronchoscopy with BAL and biopsy was done on 10/03/2023.  Patient reports being noncompliant with HIV medication for a while and she is at increased risk for RAID/opportunistic infections.  It is important to rule out infections to avoid immune reconstitution syndrome prior to initiation of HIV therapy.  Patient with very poor long-term prognosis.  Palliative care was also consulted.  8/15: Vital stable, labs with improving leukocytosis, anemia panel consistent with anemia of chronic disease with low TIBC along with iron  and folate deficiency, B12 pending.  Starting on supplement.  Tolerating G-tube feeding, electrolytes currently normal.  Patient wants to go home but she is high risk for refeeding and opportunistic infections.  BAL CD4 count results are pending.  Patient need to be started on HIV treatment will be high risk for reconstitution syndrome.  Infectious disease was also consulted for help.  Her trach stoma need to be little bit more mature before discharge, still having mild oozing.   Jamal was done yesterday.  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  Oncology ordered MRI brain for staging-was still pending so it was ordered as inpatient today.  Oncology original recommended hospice but patient wants to do everything possible, s/p trach and PEG tube placement as recommended by oncology before starting any treatment.  Patient has very poor prognosis. - Follow-up with outpatient oncology after lung lesion pathology results. - Palliative care from cancer center is on board   HIV disease [facility]) Patient was not taking her HIV medication for some time due to worsening dysphagia secondary to laryngeal cancer.  CD4 count and BAL cultures are pending. - ID was consulted to restart HIV medications as it has to be optimized before concentration of any oncologic treatment. - Patient is high risk for opportunistic  infections reconstitution syndrome.  Protein-calorie malnutrition, severe Patient with severe cachexia, significant recent weight loss which seems multifactorial with her untreated HIV and new diagnosis of metastatic malignancy.  She was having significant dysphagia and unable to eat.  S/p PEG tube placement and she was started on tube feed. - High risk for refeeding syndrome -Continue with tube feed as recommended by dietitian Monitor electrolytes closely-  Anemia Anemia panel consistent with anemia of chronic disease with iron  and folic acid  deficiency, B12 pending.   -Started on supplement -Monitor hemoglobin  COPD (chronic obstructive pulmonary disease) (HCC) No current wheezing.  S/p tracheostomy for advanced laryngeal cancer. - Continue with bronchodilators  Hyperlipidemia Home Zetia  has been held for a while due to worsening dysphagia. - Check lipid profile with morning labs.      Subjective: Patient seen and examined today.  No specific complaints and she was keep asking when she can go home.  Trach with mild oozing and PEG tube is in place.  Physical Exam: Vitals:   10/04/23 0400 10/04/23 0500 10/04/23 0600 10/04/23 0900  BP: 101/70 95/71 105/83   Pulse: 62 (!) 55 66 60  Resp: 14 12 (!) 21 20  Temp:      TempSrc:      SpO2: 98% 99% (!) 89% 100%  Weight:      Height:       General.  Cachectic lady, in no acute distress.  Trach in place Pulmonary.  Lungs clear bilaterally, normal respiratory effort. CV.  Regular rate and rhythm, no JVD, rub or murmur. Abdomen.  Soft, nontender, nondistended, BS positive.  PEG tube in place CNS.  Alert and oriented .  No focal neurologic deficit. Extremities.  No edema,  pulses intact and symmetrical. Psychiatry.  Judgment and insight appears normal.   Data Reviewed: Prior data reviewed  Family Communication: Talked with son on phone.  Disposition: Status is: Inpatient Remains inpatient appropriate because: Severity of illness  Planned Discharge Destination: Home with Home Health  Time spent: 50 minutes  This record has been created using Conservation officer, historic buildings. Errors have been sought and corrected,but may not always be located. Such creation errors do not reflect on the standard of care.   Author: Amaryllis Dare, MD 10/04/2023 1:39 PM  For on call review [email or web address].

## 2023-10-04 NOTE — Progress Notes (Signed)
 Chaplain provided Whitney [name] a visit due to her call bell ringing. Upon arrival she used paper/pen to write. Later visited with her son. She desired to have a hard conversation. She wants to go home on comfort care. She stated this, wrote this and smiled and gave thumbs when I confirmed her wishes. Son recognizes her wishes and desires to support her. Updated RN and NP.    10/04/23 2000  Spiritual Encounters  Type of Visit Initial  Care provided to: Pt and family  Conversation partners present during encounter Nurse  Referral source Chaplain assessment  Reason for visit Urgent spiritual support  OnCall Visit Yes

## 2023-10-04 NOTE — Evaluation (Signed)
 Occupational Therapy Evaluation Patient Details Name: Whitney [name] MRN: [id number] DOB: Dec 15, 1970 Today's Date: 10/04/2023   History of Present Illness   Pt is a 53 y.o. female s/p elective tracheostomy, internal jugular port placement, and open gastrostomy procedure 10/02/23.  S/p bronchoscopy 10/03/23.  PMH includes COPD, htn, anxiety, HIV positive, PNA, PTSD, stroke, L upper lobe mass and laryngeal mass (per notes L neck mass biopsied c/w head and neck CA metastatic SCC).     Clinical Impressions Pt was seen for OT/PT evaluation this date. Per chart review, prior to hospital admission, pt was not able to  ambulatory independently with family requesting RW/WC. Pt lives with her son and DIL in an apartment with 3 flights of stair to enter with no elevator. Pt able to use pen/paper and gestures to communicate with therapists. Pt presents with deficits in generalized weakness, activity tolerance and safety awareness affecting safe and optimal ADL completion. Pt completed UB and LB dressing while seated on the EOB with setupA, and MINA for lines and leads. Pt currently requires SBA for bed mobility, and STS from the EOB with constant assistance for management of lines and leads and use of RW. Pt took lateral steps up the [facility] as pt's amb was limited due to lines and leads. Pt returned to bed with all needs in reach. Pt would benefit from skilled OT services to address noted impairments and functional limitations (see below for any additional details) in order to maximize safety and independence while minimizing falls risk and caregiver burden. OT will follow acutely.   If plan is discharge home, recommend the following:   A lot of help with walking and/or transfers;A lot of help with bathing/dressing/bathroom;Assistance with cooking/housework;Help with stairs or ramp for entrance;Assist for transportation     Functional Status Assessment   Patient has had a recent decline in their functional  status and demonstrates the ability to make significant improvements in function in a reasonable and predictable amount of time.     Equipment Recommendations   Wheelchair (measurements OT);Other (comment) (Rolling walker)     Recommendations for Other Services         Precautions/Restrictions   Precautions Precautions: Fall Recall of Precautions/Restrictions: Intact Precaution/Restrictions Comments: Trach; gastrostomy, chest port; NPO Restrictions Weight Bearing Restrictions Per Provider Order: No     Mobility Bed Mobility Overal bed mobility: Needs Assistance Bed Mobility: Supine to Sit, Sit to Supine     Supine to sit: Supervision, HOB elevated Sit to supine: Supervision, HOB elevated   General bed mobility comments: SBA for lines/leads, verbal/tactile cues to technique    Transfers Overall transfer level: Needs assistance Equipment used: Rolling walker (2 wheels) Transfers: Sit to/from Stand Sit to Stand: Contact guard assist, +2 safety/equipment           General transfer comment: Assist for lines/leads throughout mobility +2 for lines and lead management; x3 trials standing with use of RW      Balance Overall balance assessment: Needs assistance Sitting-balance support: No upper extremity supported, Feet supported Sitting balance-Leahy Scale: Fair Sitting balance - Comments: Often impulsive in sitting, quickly taking her head back   Standing balance support: Bilateral upper extremity supported, Reliant on assistive device for balance Standing balance-Leahy Scale: Fair Standing balance comment: Static standing with RW                           ADL either performed or assessed with clinical judgement  ADL Overall ADL's : Needs assistance/impaired Eating/Feeding: NPO   Grooming: Set up;Sitting           Upper Body Dressing : Sitting;Set up Upper Body Dressing Details (indicate cue type and reason): MINA for lines/lead  management Lower Body Dressing: Set up;Cueing for sequencing Lower Body Dressing Details (indicate cue type and reason): MINA for lines/lead management             Functional mobility during ADLs: Contact guard assist;+2 for safety/equipment;Rolling walker (2 wheels) General ADL Comments: SBA for lines/lead management throughout UB/LB dressing tasks from sitting to standing                                Pertinent Vitals/Pain Pain Assessment Pain Assessment: 0-10 Pain Score: 8  Pain Location: abdomen (near incision and gastrostomy tube) Pain Descriptors / Indicators: Grimacing, Guarding Pain Intervention(s): Limited activity within patient's tolerance, Monitored during session, Patient requesting pain meds-RN notified     Extremity/Trunk Assessment Upper Extremity Assessment Upper Extremity Assessment: Overall WFL for tasks assessed   Lower Extremity Assessment Lower Extremity Assessment: Generalized weakness;Defer to PT evaluation   Cervical / Trunk Assessment Cervical / Trunk Assessment: Normal   Communication Communication Communication: Impaired Factors Affecting Communication: Trach/intubated   Cognition Arousal: Alert Behavior During Therapy: Impulsive Cognition: No family/caregiver present to determine baseline             OT - Cognition Comments: A/Ox4                 Following commands: Impaired Following commands impaired: Follows one step commands inconsistently     Cueing  General Comments   Cueing Techniques: Verbal cues;Visual cues  RN aware - red/clear drainage at trach site, gown saturated on arrival to room. PEG tube site some mild drainage, no change post mobility.   Exercises Exercises: Other exercises Other Exercises Other Exercises: Edu: Role of OT eval, safe ADL completion with lines/lead management, transfer technique   Shoulder Instructions      Home Living Family/patient expects to be discharged to:: Private  residence Living Arrangements: Children Available Help at Discharge: Family;Available 24 hours/day Type of Home: Apartment Home Access: Stairs to enter [employer] of Steps: 3 flights of stairs Entrance Stairs-Rails: Right;Left;Can reach both Home Layout: One level                   Additional Comments: Per chart review (notes 10/01/23), walker and w/c was ordered for pt just prior to admission (unsure if pt has received it yet).      Prior Functioning/Environment               Mobility Comments: Per chart pt was unable to ambulate independently anymore so walker and w/c was ordered for pt just prior to admission.  Pt with difficulty describing (via pen/paper communication). ADLs Comments: Pt unable to describe clear picture of PLOF, will want to confirm with family    OT Problem List: Decreased strength;Decreased activity tolerance;Impaired balance (sitting and/or standing);Decreased cognition;Decreased safety awareness;Decreased knowledge of use of DME or AE;Decreased knowledge of precautions   OT Treatment/Interventions: Self-care/ADL training;Energy conservation;DME and/or AE instruction;Therapeutic activities;Patient/family education;Balance training;Therapeutic exercise      OT Goals(Current goals can be found in the care plan section)   Acute Rehab OT Goals Patient Stated Goal: Return home OT Goal Formulation: With patient Time For Goal Achievement: 10/18/23 Potential to Achieve Goals: Good ADL Goals Pt  Will Perform Grooming: with supervision;standing Pt Will Perform Lower Body Dressing: with modified independence Pt Will Transfer to Toilet: with supervision;ambulating Pt Will Perform Toileting - Clothing Manipulation and hygiene: with modified independence;sit to/from stand   OT Frequency:  Min 2X/week    Co-evaluation PT/OT/SLP Co-Evaluation/Treatment: Yes Reason for Co-Treatment: For patient/therapist safety;To address functional/ADL  transfers PT goals addressed during session: Mobility/safety with mobility;Balance;Proper use of DME OT goals addressed during session: ADL's and self-care      AM-PAC OT 6 Clicks Daily Activity     Outcome Measure Help from another person eating meals?: Total Help from another person taking care of personal grooming?: A Lot Help from another person toileting, which includes using toliet, bedpan, or urinal?: A Lot Help from another person bathing (including washing, rinsing, drying)?: A Lot Help from another person to put on and taking off regular upper body clothing?: A Little Help from another person to put on and taking off regular lower body clothing?: A Little 6 Click Score: 13   End of Session Equipment Utilized During Treatment: Rolling walker (2 wheels);Other (comment) [place]) Nurse Communication: Mobility status;Other (comment) (Mild drainage at PEG tube and red/clear fluid around trach site)  Activity Tolerance: Patient tolerated treatment well Patient left: in bed;with call bell/phone within reach;with bed alarm set  OT Visit Diagnosis: Unsteadiness on feet (R26.81);Other abnormalities of gait and mobility (R26.89);Muscle weakness (generalized) (M62.81)                Time: 9150-9070 OT Time Calculation (min): 40 min Charges:  OT General Charges $OT Visit: 1 Visit OT Evaluation $OT Eval High Complexity: 1 High  Larraine Colas M.S. OTR/L  10/04/23, 10:49 AM

## 2023-10-04 NOTE — Assessment & Plan Note (Signed)
 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  Oncology ordered MRI brain for staging-was still pending so it was ordered as inpatient today.  Oncology original recommended hospice but patient wants to do everything possible, s/p trach and PEG tube placement as recommended by oncology before starting any treatment.  Patient has very poor prognosis. - Follow-up with outpatient oncology after lung lesion pathology results. - Palliative care from cancer center is on board

## 2023-10-04 NOTE — Assessment & Plan Note (Signed)
 Anemia panel consistent with anemia of chronic disease with iron  and folic acid  deficiency, B12 pending.   -Started on supplement -Monitor hemoglobin

## 2023-10-05 ENCOUNTER — Inpatient Hospital Stay

## 2023-10-05 DIAGNOSIS — R918 Other nonspecific abnormal finding of lung field: Secondary | ICD-10-CM | POA: Diagnosis not present

## 2023-10-05 DIAGNOSIS — D649 Anemia, unspecified: Secondary | ICD-10-CM | POA: Diagnosis not present

## 2023-10-05 DIAGNOSIS — E43 Unspecified severe protein-calorie malnutrition: Secondary | ICD-10-CM | POA: Diagnosis not present

## 2023-10-05 DIAGNOSIS — C329 Malignant neoplasm of larynx, unspecified: Secondary | ICD-10-CM | POA: Diagnosis not present

## 2023-10-05 DIAGNOSIS — Z515 Encounter for palliative care: Secondary | ICD-10-CM | POA: Diagnosis not present

## 2023-10-05 DIAGNOSIS — J449 Chronic obstructive pulmonary disease, unspecified: Secondary | ICD-10-CM | POA: Diagnosis not present

## 2023-10-05 DIAGNOSIS — B20 Human immunodeficiency virus [HIV] disease: Secondary | ICD-10-CM | POA: Diagnosis not present

## 2023-10-05 DIAGNOSIS — C760 Malignant neoplasm of head, face and neck: Secondary | ICD-10-CM | POA: Diagnosis not present

## 2023-10-05 DIAGNOSIS — E785 Hyperlipidemia, unspecified: Secondary | ICD-10-CM | POA: Diagnosis not present

## 2023-10-05 LAB — RENAL FUNCTION PANEL
Albumin: 2.1 g/dL — ABNORMAL LOW (ref 3.5–5.0)
Anion gap: 6 (ref 5–15)
BUN: 39 mg/dL — ABNORMAL HIGH (ref 6–20)
CO2: 34 mmol/L — ABNORMAL HIGH (ref 22–32)
Calcium: 9.5 mg/dL (ref 8.9–10.3)
Chloride: 102 mmol/L (ref 98–111)
Creatinine, Ser: 0.72 mg/dL (ref 0.44–1.00)
GFR, Estimated: >60 mL/min (ref >60)
Glucose, Bld: 95 mg/dL (ref 70–99)
Phosphorus: 2.4 mg/dL — ABNORMAL LOW (ref 2.5–4.6)
Potassium: 4.1 mmol/L (ref 3.5–5.1)
Sodium: 142 mmol/L (ref 135–145)

## 2023-10-05 LAB — BLOOD GAS, ARTERIAL
Acid-Base Excess: 10.1 mmol/L — ABNORMAL HIGH (ref 0.0–2.0)
Bicarbonate: 37.5 mmol/L — ABNORMAL HIGH (ref 20.0–28.0)
FIO2: 0.28 %
O2 Saturation: 96.4 %
Patient temperature: 37
pCO2 arterial: 62 mmHg — ABNORMAL HIGH (ref 32–48)
pH, Arterial: 7.39 (ref 7.35–7.45)
pO2, Arterial: 74 mmHg — ABNORMAL LOW (ref 83–108)

## 2023-10-05 LAB — HIV-1 RNA QUANT-NO REFLEX-BLD
HIV 1 RNA Quant: 30 {copies}/mL
LOG10 HIV-1 RNA: 1.477 {Log_copies}/mL

## 2023-10-05 LAB — GLUCOSE, CAPILLARY
Glucose-Capillary: 126 mg/dL — ABNORMAL HIGH (ref 70–99)
Glucose-Capillary: 175 mg/dL — ABNORMAL HIGH (ref 70–99)
Glucose-Capillary: 207 mg/dL — ABNORMAL HIGH (ref 70–99)
Glucose-Capillary: 85 mg/dL (ref 70–99)
Glucose-Capillary: 97 mg/dL (ref 70–99)

## 2023-10-05 LAB — LIPID PANEL
Cholesterol: 142 mg/dL (ref 0–200)
HDL: 54 mg/dL (ref >40)
LDL Cholesterol: 75 mg/dL (ref 0–99)
Total CHOL/HDL Ratio: 2.6 ratio
Triglycerides: 66 mg/dL (ref <150)
VLDL: 13 mg/dL (ref 0–40)

## 2023-10-05 LAB — RPR: RPR Ser Ql: NONREACTIVE

## 2023-10-05 LAB — MAGNESIUM: Magnesium: 2.4 mg/dL (ref 1.7–2.4)

## 2023-10-05 MED ORDER — GADOBUTROL 1 MMOL/ML IV SOLN
3.0000 mL | Freq: Once | INTRAVENOUS | Status: AC | PRN
  Administered 2023-10-05: 3 mL via INTRAVENOUS

## 2023-10-05 NOTE — Progress Notes (Signed)
 Progress Note   Patient: Whitney [name] FMW:[id number] DOB: [birth year] DOA: 10/02/2023     3 DOS: the patient was seen and examined on 10/05/2023   Brief hospital course: PCCM transfer on 10/04/2023.  Taken from prior notes.  VERBLE STYRON is a 53 y.o. female with a past medical history of HIV, COPD, hypertension, hyperlipidemia, malnutrition, PTSD, stroke, anxiety, and depression who presents to [facility] on 10/02/23 for elective Tracheostomy, G-tube, and Chest port placement due to metastatic head and neck cancer.    She recently presented to the emergency room with increasing shortness of breath, along with significant amount of weight loss greater than 30 pounds as well as difficulty swallowing recently.  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   Post procedure she was transferred to Stepdown/ICU for close monitoring.   Tracheostomy and bronchoscopy with BAL and biopsy was done on 10/03/2023.  Patient reports being noncompliant with HIV medication for a while and she is at increased risk for RAID/opportunistic infections.  It is important to rule out infections to avoid immune reconstitution syndrome prior to initiation of HIV therapy.  Patient with very poor long-term prognosis.  Palliative care was also consulted.  8/15: Vital stable, labs with improving leukocytosis, anemia panel consistent with anemia of chronic disease with low TIBC along with iron  and folate deficiency, B12 pending.  Starting on supplement.  Tolerating G-tube feeding, electrolytes currently normal.  Patient wants to go home but she is high risk for refeeding and opportunistic infections.  BAL CD4 count results are pending.  Patient need to be started on HIV treatment will be high risk for reconstitution syndrome.  Infectious disease was also consulted for help.  Her trach stoma need to be little bit more mature before discharge, still having mild oozing.   Jamal was done yesterday.  8/16: Patient with some intermittent confusion and increased lethargy, ABG with some hypercapnia so she was started on BiPAP.  Palliative care was consulted as she express yesterday evening that she does not want to go home and stay comfortable.  CODE STATUS changed to DNR, still no final decision regarding comfort care.  Patient might try some chemo first before deciding.  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  MRI brain was done yesterday and it was negative for any metastatic disease.  Oncology original recommended hospice but patient wants to do everything possible, s/p trach and PEG tube placement as recommended by oncology before starting any treatment.  Patient has very poor prognosis. - Follow-up with outpatient oncology after lung lesion pathology results. - Palliative care from cancer center is on board - Palliative care in hospital was also consulted as patient expressed her wishes to become just comfortable yesterday evening, currently changing her mind, intermittent confusion   HIV disease (HCC) Patient was not taking her HIV medication for some time due to worsening dysphagia secondary to laryngeal cancer.  CD4 count and BAL cultures are pending. - ID was consulted to restart HIV medications as it has to be optimized before concentration of any oncologic treatment. - Patient is high risk for opportunistic  infections reconstitution syndrome. - ID restarted Biktarvy   Protein-calorie malnutrition, severe Patient with severe cachexia, significant  recent weight loss which seems multifactorial with her untreated HIV and new diagnosis of metastatic malignancy.  She was having  significant dysphagia and unable to eat.  S/p PEG tube placement and she was started on tube feed. - High risk for refeeding syndrome -Continue with tube feed as recommended by dietitian Monitor electrolytes closely-  Anemia Anemia panel consistent with anemia of chronic disease with iron  and folic acid  deficiency, B12 pending.   -Started on supplement -Monitor hemoglobin  COPD (chronic obstructive pulmonary disease) (HCC) No current wheezing.  S/p tracheostomy for advanced laryngeal cancer. - Continue with bronchodilators  Hyperlipidemia Home Zetia has been held for a while due to worsening dysphagia. - Check lipid profile with morning labs.      Subjective: Patient was with BiPAP through trach when seen today.  She was asking when she can go home.  Physical Exam: Vitals:   10/04/23 2148 10/05/23 0000 10/05/23 0400 10/05/23 0500  BP:  (!) 82/61 (!) 135/91   Pulse:  62 65   Resp:  20 20   Temp:  98.7 F (37.1 C) 98.6 F (37 C)   TempSrc:  Oral Oral   SpO2: 99% 100% 98%   Weight:    31.2 kg  Height:       General.  Cachectic lady, in no acute distress. Pulmonary.  Lungs clear bilaterally, normal respiratory effort. CV.  Regular rate and rhythm, no JVD, rub or murmur. Abdomen.  Soft, nontender, nondistended, BS positive. CNS.  Alert and oriented .  No focal neurologic deficit. Extremities.  No edema, no cyanosis, pulses intact and symmetrical.   Data Reviewed: Prior data reviewed  Family Communication: Daughter at bedside  Disposition: Status is: Inpatient Remains inpatient appropriate because: Severity of illness  Planned Discharge Destination: Home with Home Health  Time spent: 50 minutes  This record has been created using Conservation officer, historic buildings. Errors have been sought and corrected,but may not always be located. Such creation errors do not reflect on the standard of care.   Author: Amaryllis Dare, MD 10/05/2023 4:28 PM  For on call review  [email or web address].

## 2023-10-05 NOTE — Assessment & Plan Note (Signed)
 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  MRI brain was done yesterday and it was negative for any metastatic disease.  Oncology original recommended hospice but patient wants to do everything possible, s/p trach and PEG tube placement as recommended by oncology before starting any treatment.  Patient has very poor prognosis. - Follow-up with outpatient oncology after lung lesion pathology results. - Palliative care from cancer center is on board - Palliative care in hospital was also consulted as patient expressed her wishes to become just comfortable yesterday evening, currently changing her mind, intermittent confusion

## 2023-10-05 NOTE — Progress Notes (Signed)
 NAME:  Whitney [name], MRN:  [id number], DOB:  May 13, 1970, LOS: 3 ADMISSION DATE:  10/02/2023, CONSULTATION DATE:  10/02/2023 REFERRING MD:  Dr. Blair, CHIEF COMPLAINT:  Post-op management of Tracheostomy, medical management   Brief Pt Description / Synopsis:  53 y.o. female with PMHx most significant for COPD and HIV, presented for elective Tracheostomy for airway protection due to metastatic Head and Neck Cancer, along with G-tube and Chest port placement.    History of Present Illness:  Whitney [name] is a 53 y.o. female with a past medical history of HIV, COPD, hypertension, hyperlipidemia, malnutrition, PTSD, stroke, anxiety, and depression who presents to [facility] on 10/02/23 for elective Tracheostomy, G-tube, and Chest port placement due to metastatic head and neck cancer.   She recently presented to the emergency room with increasing shortness of breath, along with significant amount of weight loss greater than 30 pounds as well as difficulty swallowing recently.  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   Post procedure she was transferred to Stepdown/ICU for close monitoring.  PCCM asked to consult for medical management post procedure.  Please see Significant Hospital Events section below for full detailed hospital course.  10/05/23- patient is up sitting in bed.  She is using tracheostomy.  She is meeting with palliative care.  We may need to get MRI brain to check for brain mets.   Pertinent  Medical History   Past Medical History:  Diagnosis Date   Anemia    Anxiety    COPD (chronic obstructive pulmonary disease) (HCC)    Depression    GERD (gastroesophageal reflux disease)    History of methicillin resistant staphylococcus aureus (MRSA) 2010   HIV disease (HCC)    dx in 2010   Hyperlipidemia    Hypertension    Lung mass    Malnutrition (HCC)    Marijuana use    Neoplasm of larynx    Pneumonia     Psoriasis    PTSD (post-traumatic stress disorder)    Stroke (HCC)    Tobacco dependence    Vapes nicotine  containing substance     Micro Data:  8/13: MRSA PCR>>  Antimicrobials:   Anti-infectives (From admission, onward)    Start     Dose/Rate Route Frequency Ordered Stop   10/05/23 0800  atovaquone  (MEPRON ) 750 MG/5ML suspension 1,500 mg        1,500 mg Per Tube Daily with breakfast 10/04/23 1850     10/04/23 1530  bictegravir-emtricitabine -tenofovir  AF (BIKTARVY ) 50-200-25 MG per tablet 1 tablet       Note to Pharmacy: Dissolve in water  and give thru PEG   1 tablet Oral Daily 10/04/23 1444     10/02/23 2200  cefoTEtan  (CEFOTAN ) 2 g in sodium chloride  0.9 % 100 mL IVPB        2 g 200 mL/hr over 30 Minutes Intravenous Every 12 hours 10/02/23 1515 10/03/23 1402   10/02/23 0600  cefoTEtan  (CEFOTAN ) 2 g in sodium chloride  0.9 % 100 mL IVPB        2 g 200 mL/hr over 30 Minutes Intravenous On call to O.R. 10/01/23 2240 10/02/23 1300       Significant Hospital Events: Including procedures, antibiotic start and stop dates in addition to other pertinent events   8/13: Presented for elective Tracheostomy placement, open Gastrostomy tube placement, and chest port placement due to metastatic head and neck cancer.  Transferred to [facility]  post procedure, PCCM consulted to assist with management.  Interim History / Subjective:  As outlined above under Significant Hospital Events section  Objective   Blood pressure (!) 135/91, pulse 65, temperature 98.6 F (37 C), temperature source Oral, resp. rate 20, height 5' 4 (1.626 m), weight 31.2 kg, last menstrual period 08/29/2014, SpO2 98%.    FiO2 (%):  [28 %] 28 %   Intake/Output Summary (Last 24 hours) at 10/05/2023 0853 Last data filed at 10/04/2023 2202 Gross per 24 hour  Intake 475.05 ml  Output --  Net 475.05 ml   Filed Weights   10/02/23 1819 10/03/23 0702 10/05/23 0500  Weight: 30.8 kg 31.2 kg 31.2 kg     Examination: General: Acute on chronically ill appearing cachectic frail female, laying in bed, anxious, in NAD HENT: Atraumatic, normocephalic, neck supple, newly placed Tracheostomy in place midline Lungs: Clear breath sounds throughout, even, non-labored Cardiovascular: RRR, no M/R/G Abdomen: Soft, tender to palpation, midline abdominal abdominal dermabond incision clean, dry, intact, and well approximated with G-tube Extremities: Cachectic, no defotmities, no edema, no cyanosis, good peripheral perfusion Neuro: Awake and alert GU: Deferred  Resolved Hospital Problem list     Assessment & Plan:   #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 Parris  #HIV   - medication noncompliance    - possible AIDS   - ruling out opportunistic inectious etiology -Monitor fever curve -Trend WBC's  -Follow cultures as above -It is reported she does not take her Biktarvy  nor Triumeq  #Anxiety #Depression PMHx: Stroke -Treatment of metabolic derangements as outlined above -Provide supportive care -Promote normal sleep/wake cycle and family presence -Avoid sedating medications as able -Pain control -Prn Valium  for anxiety     Best Practice (right click and Reselect all SmartList Selections daily)   Diet/type: NPO until G-tube cleared for use by Surgery (may use in am on 8/14) DVT prophylaxis: SCD (may resume chemical DVT ppx on 8/14 per Surgery) GI prophylaxis: PPI Lines: N/A Foley:  N/A Code Status:  full code Last date of multidisciplinary goals of care  discussion [N/A]   Labs   CBC: Recent Labs  Lab 10/02/23 1558 10/02/23 1852 10/03/23 0544 10/04/23 0802  WBC 5.9 9.0 11.0* 7.9  NEUTROABS  --  7.9*  --   --   HGB 13.3 13.0 12.9 11.7*  HCT 43.5 42.0 43.2 39.3  MCV 85.5 85 85.7 85.1  PLT 172 187 174 181    Basic Metabolic Panel: Recent Labs  Lab 10/02/23 1558 10/03/23 0544 10/04/23 0802 10/05/23 0648  NA 144 142 142 142  K 4.8 4.4 4.1 4.1  CL 101 101 105 102  CO2 34* 33* 32 34*  GLUCOSE 112* 65* 98 95  BUN 34* 42* 46* 39*  CREATININE 0.99 1.02* 0.90 0.72  CALCIUM 11.8* 11.3* 10.0 9.5  MG  --  2.8* 2.5* 2.4  PHOS 5.0* 5.9* 3.8 2.4*   GFR: Estimated Creatinine Clearance: 39.1 mL/min (by C-G formula based on SCr of 0.72 mg/dL). Recent Labs  Lab 10/02/23 1558 10/02/23 1852 10/03/23 0544 10/04/23 0802  WBC 5.9 9.0 11.0* 7.9  Liver Function Tests: Recent Labs  Lab 10/02/23 1558 10/03/23 0544 10/04/23 0802 10/05/23 0648  ALBUMIN 2.7* 2.6* 2.1* 2.1*   No results for input(s): LIPASE, AMYLASE in the last 168 hours. No results for input(s): AMMONIA in the last 168 hours.  ABG No results found for: PHART, PCO2ART, PO2ART, HCO3, TCO2, ACIDBASEDEF, O2SAT   Coagulation Profile: No results for input(s): INR, PROTIME in the last 168 hours.  Cardiac Enzymes: No results for input(s): CKTOTAL, CKMB, CKMBINDEX, TROPONINI in the last 168 hours.  HbA1C: No results found for: HGBA1C  CBG: Recent Labs  Lab 10/04/23 1133 10/04/23 1632 10/04/23 1945 10/05/23 0014 10/05/23 0813  GLUCAP 156* 133* 87 126* 97    Review of Systems:   Unable to assess due to Tracheostomy    Past Medical History:  She,  has a past medical history of Anemia, Anxiety, COPD (chronic obstructive pulmonary disease) (HCC), Depression, GERD (gastroesophageal reflux disease), History of methicillin resistant staphylococcus aureus (MRSA) (2010), HIV disease (HCC), Hyperlipidemia, Hypertension, Lung  mass, Malnutrition (HCC), Marijuana use, Neoplasm of larynx, Pneumonia, Psoriasis, PTSD (post-traumatic stress disorder), Stroke (HCC), Tobacco dependence, and Vapes nicotine  containing substance.   Surgical History:   Past Surgical History:  Procedure Laterality Date   CESAREAN SECTION     CREATION, GASTROSTOMY, OPEN N/A 10/02/2023   Procedure: CREATION, GASTROSTOMY, OPEN;  Surgeon: Jordis Laneta FALCON, MD;  Location: [facility] ORS;  Service: General;  Laterality: N/A;  G-tube in place   ESOPHAGOGASTRODUODENOSCOPY (EGD) WITH PROPOFOL  N/A 12/13/2022   Procedure: ESOPHAGOGASTRODUODENOSCOPY (EGD) WITH PROPOFOL ;  Surgeon: Therisa Bi, MD;  Location: [facility] ENDOSCOPY;  Service: Gastroenterology;  Laterality: N/A;   PORTACATH PLACEMENT Left 10/02/2023   Procedure: INSERTION, TUNNELED CENTRAL VENOUS DEVICE, WITH PORT;  Surgeon: Jordis Laneta FALCON, MD;  Location: [facility] ORS;  Service: General;  Laterality: Left;   TRACHEOSTOMY TUBE PLACEMENT N/A 10/02/2023   Procedure: CREATION, TRACHEOSTOMY;  Surgeon: Blair Mt, MD;  Location: [facility] ORS;  Service: ENT;  Laterality: N/A;  AWAKE TRACHEOSTOMY   TUBAL LIGATION       Social History:   reports that she has been smoking cigarettes. She has a 18 pack-year smoking history. She has never used smokeless tobacco. She reports that she does not currently use alcohol. She reports current drug use. Drug: Marijuana.   Family History:  Her family history includes CAD in her father; Hypertension in her mother; Parkinson's disease in her father.   Allergies Allergies  Allergen Reactions   Penicillins Hives   Aspirin Rash and Other (See Comments)    Reaction:  GI upset      Home Medications  Prior to Admission medications   Medication Sig Start Date End Date Taking? Authorizing Provider  acetaminophen  (TYLENOL  CHILDRENS) 160 MG/5ML suspension Take 15.6 mLs (500 mg total) by mouth every 6 (six) hours as needed. 09/01/23  Yes Clarine Ozell LABOR, MD  albuterol  (ACCUNEB ) 1.25 MG/3ML  nebulizer solution Take 3 mLs (1.25 mg total) by nebulization every 6 (six) hours as needed for wheezing. 09/01/23  Yes Clarine Ozell LABOR, MD  alum & mag hydroxide-simeth (MAALOX/MYLANTA) 200-200-20 MG/5ML suspension Take 15 mLs by mouth every 6 (six) hours as needed for indigestion or heartburn.   Yes [provider]  feeding supplement, ENSURE ENLIVE, (ENSURE ENLIVE) LIQD Take 237 mLs by mouth 2 (two) times daily between meals. 10/02/14  Yes Vaickute, Rima, MD  Fluticasone -Salmeterol (ADVAIR) 250-50 MCG/DOSE AEPB Inhale 1 puff into the lungs 2 (two) times daily.   Yes [provider]  lidocaine  (XYLOCAINE ) 2 % solution Use as directed 15 mLs in the mouth or throat as needed for mouth pain. 09/01/23  Yes Clarine Ozell LABOR, MD  LORazepam  (ATIVAN ) 0.5 MG tablet Take 0.5 tablets (0.25 mg total) by mouth every 8 (eight) hours as needed for anxiety. 09/23/23  Yes Jacobo Evalene PARAS, MD  Morphine  Sulfate (MORPHINE  CONCENTRATE) 10 mg / 0.5 ml concentrated solution Take 0.25 mLs (5 mg total) by mouth every 6 (six) hours as needed. 09/06/23  Yes Borders, Fonda SAUNDERS, NP  ondansetron  (ZOFRAN -ODT) 4 MG disintegrating tablet Take 1 tablet (4 mg total) by mouth every 8 (eight) hours as needed for nausea or vomiting. 09/23/23  Yes Jacobo Evalene PARAS, MD  Phenylephrine -DM-GG-APAP 5-10-200-325 MG/15ML LIQD Take 15 mLs by mouth daily as needed (allergies).   Yes [provider]  tiotropium (SPIRIVA ) 18 MCG inhalation capsule Place 18 mcg into inhaler and inhale daily.   Yes [provider]  triamcinolone ointment (KENALOG) 0.1 % Apply 1 Application topically daily as needed (irritation). 03/30/21  Yes [provider]  abacavir -dolutegravir -lamiVUDine  (TRIUMEQ) 600-50-300 MG tablet Take 1 tablet by mouth daily. Patient not taking: Reported on 10/01/2023    [provider]  bictegravir-emtricitabine -tenofovir  AF (BIKTARVY ) 50-200-25 MG TABS tablet Take 1 tablet by mouth  daily. Patient not taking: Reported on 10/01/2023    [provider]  ezetimibe (ZETIA) 10 MG tablet Take 10 mg by mouth daily. 04/06/21   [provider]  famotidine  (PEPCID ) 20 MG tablet Take 1 tablet (20 mg total) by mouth daily. 09/01/23 10/01/23  Clarine Ozell LABOR, MD  ferrous sulfate  325 (65 FE) MG tablet Take 1 tablet (325 mg total) by mouth 2 (two) times daily with a meal. Patient not taking: Reported on 12/06/2022 01/30/16   Edelmiro Leash, MD  levofloxacin  (LEVAQUIN ) 750 MG tablet Take 1 tablet (750 mg total) by mouth daily. Patient not taking: Reported on 09/27/2023 09/12/14   Barbette Cea, MD  methylPREDNISolone  (MEDROL  DOSEPAK) 4 MG TBPK tablet follow package directions Patient not taking: Reported on 12/06/2022 10/02/14   Vaickute, Rima, MD  naloxone  (NARCAN ) nasal spray 4 mg/0.1 mL SPRAY 1 SPRAY INTO ONE NOSTRIL AS DIRECTED FOR OPIOID OVERDOSE (TURN PERSON ON SIDE AFTER DOSE. IF NO RESPONSE IN 2-3 MINUTES OR PERSON RESPONDS BUT RELAPSES, REPEAT USING A NEW SPRAY DEVICE AND SPRAY INTO THE OTHER NOSTRIL. CALL 911 AFTER USE.) * EMERGENCY USE ONLY * Patient not taking: No sig reported 09/06/23   Borders, Fonda SAUNDERS, NP  pantoprazole  (PROTONIX ) 40 MG tablet Take 1 tablet (40 mg total) by mouth 2 (two) times daily. Patient not taking: No sig reported 12/13/22 12/13/23  Therisa Bi, MD     Critical care provider statement:   Total critical care time: 33 minutes   Performed by: Parris MD   Critical care time was exclusive of separately billable procedures and treating other patients.   Critical care was necessary to treat or prevent imminent or life-threatening deterioration.   Critical care was time spent personally by me on the following activities: development of treatment plan with patient and/or surrogate as well as nursing, discussions with consultants, evaluation of patient's response to treatment, examination of patient, obtaining history from patient or surrogate,  ordering and performing treatments and interventions, ordering and review of laboratory studies, ordering and review of radiographic studies, pulse oximetry and re-evaluation of patient's condition.    Teka Chanda, M.D.  Pulmonary & Critical Care Medicine

## 2023-10-05 NOTE — Consult Note (Signed)
 Consultation Note Date: 10/05/2023   Patient Name: Whitney [name]  DOB: Jun 13, 1970  MRN: [id number]  Age / Sex: 53 y.o., female  PCP: Revelo, Yancy Roof, MD Referring Physician: Caleen Qualia, MD  Reason for Consultation: Establishing goals of care   HPI/Brief Hospital Course: 53 y.o. female  with past medical history of HIV-non compliant with medications, COPD, HTN, HLD, CVA, PTSD, anxiety and depression admitted from home on 10/02/2023 for elective tracheostomy, g-tube and port placement.  Presented to ED 09/01/2023-SHOB and significant weight loss, imaging at that concerning for malignancy Initial visit with oncology 7/18-reviewed PET scan-laryngeal and lung mass-possibly two separate primaries S/p cervical lymph node biopsy confirmed SCC--at follow-up visit with oncology 8/4 Whitney [name] adamant about pursuing treatment understanding treatment would be a palliative approach versus curative in nature and would require trach/PEG placement  Tracheostomy, G-tube and port placed 8/13  Place on bipap 8/16 AM due to ABG results and new onset confusion  Palliative medicine was consulted for assisting with goals of care conversations.  Subjective:  Extensive chart review has been completed prior to meeting patient including labs, vital signs, imaging, progress notes, orders, and available advanced directive documents from current and previous encounters.  Visited with Whitney [name] at her bedside. She is awakens easily to calling of her name but easily drifts back to sleep without redirection. Unable to clearly communicate or use written communication, repeats her desire to discharge home.  Returned to bedside soon after, daughter visiting.  Introduced myself as a Publishing rights manager as a member of the palliative care team. Explained palliative medicine is specialized medical care for people living with serious illness. It focuses on providing relief from  the symptoms and stress of a serious illness. The goal is to improve quality of life for both the patient and the family.   Daughter shares she and her brother-Joey are Whitney [name] only children. Whitney [name] separated from their father many years ago but they are not legally divorced. Called and spoke with ex-spouse, [name], he opts to allow [place] and [name] (son) to serve as Merchant navy officer for Ms. Vanvleck.  Family shares Whitney [name] currently lives at home with Joey-her son, remains independent, [place] also lives nearby. Continues to smoke daily.  [place] and Joey able to share their understanding of Whitney [name] current medical condition. Newly diagnosed malignancy-plan to provide palliative treatment in hopes to improve quality of life and allow [name] to possibly spend more time with family. Daughter shares she was told without treatment expected prognosis was about 6 months, with treatment about a year.  We discussed patient's current illness and what it means in the larger context of patient's on-going co-morbidities. Natural disease trajectory and expectations at EOL were discussed.   Joey expresses concern related to a conversation had with chaplain services, felt the conversations caused anxiety to family as well as Ms. Schwoerer. Conversation resulted in recommendations made to consider comfort/hospice care--Joey feels Whitney [name] was not able to fully understand conversations or recommendations being made. Joey shares, Whitney [name] is only focused on returning home.  We discussed concern related to Whitney [name] declining mentation, could be related to CO2 retention although this seems to be a chronic issue, MRI (-) for brain metastases, possible hospital/ICU delirium. Recommend promoting sleep hygiene and delirium precautions.  Attempted to elicit goals of care. We discussed code status and the difference between Full Code and Do Not Resuscitate. Encouraged  family to consider DNR status understanding evidenced based poor outcomes  in similar hospitalized patients, as the cause of the arrest is likely associated with chronic/terminal disease rather than a reversible acute cardio-pulmonary event.  Joey and [place] share Whitney [name] has always shared she would not be accepting of resuscitation, given trach placement they feel as though she would be accepting of short term ventilatory support in pre-arrest conditions. Orders placed to reflect wishes.  We also discussed concerns related to Whitney [name] ability to tolerate oncological treatment/interventions. We also discussed concern related to re-feeding syndrome given [name] severe malnutrition. Family shares they are aware of concerns, they feel as though they have recognized Whitney [name] continued decline. At this time they wish to continue with current plan of care allowing time for recovery and outcomes but remain open to continued goals of care conversations.  I discussed importance of continued conversations with family/support persons and all members of their medical team regarding overall plan of care and treatment options ensuring decisions are in alignment with patients goals of care.  All questions/concerns addressed. Emotional support provided to patient/family/support persons. PMT will continue to follow and support patient as needed.  Objective: Primary Diagnoses: Present on Admission:  Laryngeal cancer (HCC)  COPD (chronic obstructive pulmonary disease) (HCC)  HIV disease (HCC)   Physical Exam Constitutional:      General: She is not in acute distress.    Appearance: She is cachectic. She is ill-appearing.     Comments: Drowsy, frail, temporal wasting  Pulmonary:     Effort: Pulmonary effort is normal. No respiratory distress.  Skin:    General: Skin is warm and dry.  Neurological:     Mental Status: She is easily aroused. She is disoriented.     Motor: Weakness  present.     Vital Signs: BP (!) 135/91 (BP Location: Left Arm)   Pulse 65   Temp 98.6 F (37 C) (Oral)   Resp 20   Ht 5' 4 (1.626 m)   Wt 31.2 kg   LMP 08/29/2014   SpO2 98%   BMI 11.81 kg/m  Pain Scale: 0-10   Pain Score: 4   IO: Intake/output summary:  Intake/Output Summary (Last 24 hours) at 10/05/2023 1615 Last data filed at 10/05/2023 1200 Gross per 24 hour  Intake 300 ml  Output --  Net 300 ml    LBM: Last BM Date : 10/01/23 Baseline Weight: Weight: 30.8 kg Most recent weight: Weight: 31.2 kg      Assessment and Plan  SUMMARY OF RECOMMENDATIONS   DNR-pre arrest interventions desired Delirium precautions Ongoing GOC-time for recovery and outcomes  Palliative Prophylaxis:   Bowel Regimen, Delirium Protocol and Frequent Pain Assessment  Discussed With: CCM team and nursing staff   Thank you for this consult and allowing Palliative Medicine to participate in the care of Kaylea C. Scharf. Palliative medicine will continue to follow and assist as needed.   Time Total: 75 minutes  Time spent includes: Detailed review of medical records (labs, imaging, vital signs), medically appropriate exam (mental status, respiratory, cardiac, skin), discussed with treatment team, counseling and educating patient, family and staff, documenting clinical information, medication management and coordination of care.   Signed by: Waddell Lesches, DNP, AGNP-C Palliative Medicine    Please contact Palliative Medicine Team phone at [phone] for questions and concerns.  For individual provider: See Tracey

## 2023-10-05 NOTE — Plan of Care (Signed)

## 2023-10-05 NOTE — Assessment & Plan Note (Signed)
 Patient was not taking her HIV medication for some time due to worsening dysphagia secondary to laryngeal cancer.  CD4 count and BAL cultures are pending. - ID was consulted to restart HIV medications as it has to be optimized before concentration of any oncologic treatment. - Patient is high risk for opportunistic  infections reconstitution syndrome. - ID restarted Biktarvy 

## 2023-10-06 DIAGNOSIS — Z789 Other specified health status: Secondary | ICD-10-CM

## 2023-10-06 DIAGNOSIS — Z7189 Other specified counseling: Secondary | ICD-10-CM | POA: Diagnosis not present

## 2023-10-06 DIAGNOSIS — C329 Malignant neoplasm of larynx, unspecified: Secondary | ICD-10-CM | POA: Diagnosis not present

## 2023-10-06 DIAGNOSIS — Z515 Encounter for palliative care: Secondary | ICD-10-CM | POA: Diagnosis not present

## 2023-10-06 DIAGNOSIS — Z66 Do not resuscitate: Secondary | ICD-10-CM | POA: Diagnosis not present

## 2023-10-06 LAB — GLUCOSE, CAPILLARY
Glucose-Capillary: 142 mg/dL — ABNORMAL HIGH (ref 70–99)
Glucose-Capillary: 148 mg/dL — ABNORMAL HIGH (ref 70–99)
Glucose-Capillary: 210 mg/dL — ABNORMAL HIGH (ref 70–99)
Glucose-Capillary: 74 mg/dL (ref 70–99)
Glucose-Capillary: 88 mg/dL (ref 70–99)

## 2023-10-06 LAB — RENAL FUNCTION PANEL
Albumin: 2 g/dL — ABNORMAL LOW (ref 3.5–5.0)
Anion gap: 0 — ABNORMAL LOW (ref 5–15)
BUN: 38 mg/dL — ABNORMAL HIGH (ref 6–20)
CO2: 38 mmol/L — ABNORMAL HIGH (ref 22–32)
Calcium: 9.3 mg/dL (ref 8.9–10.3)
Chloride: 106 mmol/L (ref 98–111)
Creatinine, Ser: 0.57 mg/dL (ref 0.44–1.00)
GFR, Estimated: >60 mL/min (ref >60)
Glucose, Bld: 72 mg/dL (ref 70–99)
Phosphorus: 1.5 mg/dL — ABNORMAL LOW (ref 2.5–4.6)
Potassium: 4.6 mmol/L (ref 3.5–5.1)
Sodium: 144 mmol/L (ref 135–145)

## 2023-10-06 LAB — ACID FAST SMEAR (AFB, MYCOBACTERIA): Acid Fast Smear: NEGATIVE

## 2023-10-06 LAB — MAGNESIUM: Magnesium: 2.6 mg/dL — ABNORMAL HIGH (ref 1.7–2.4)

## 2023-10-06 MED ORDER — SODIUM PHOSPHATES 45 MMOLE/15ML IV SOLN
30.0000 mmol | Freq: Once | INTRAVENOUS | Status: AC
  Administered 2023-10-06: 30 mmol via INTRAVENOUS
  Filled 2023-10-06: qty 10

## 2023-10-06 NOTE — Progress Notes (Signed)
 Palliative Care Progress Note, Assessment & Plan   Patient Name: Whitney [name]       Date: 10/06/2023 DOB: October 21, 1970  Age: 53 y.o. MRN#: [id number] Attending Physician: Barbarann Nest, MD Primary Care Physician: Ernie Yancy Roof, MD Admit Date: 10/02/2023  Subjective: During visit patient mouths responses or writes them on paper due to new tracheostomy placement.  Patient endorses mild abdominal pain.  States she slept well last night.  Denies shortness of breath or chest pain.  States she has been receiving nutrition via feeding tube.  Denies nausea.  States she had BM today.  States wants to go home multiple times.  HPI: Per previous HPI: 53 y.o. female  with past medical history of HIV-non compliant with medications, COPD, HTN, HLD, CVA, PTSD, anxiety and depression admitted from home on 10/02/2023 for elective tracheostomy, g-tube and port placement.   Presented to ED 09/01/2023-SHOB and significant weight loss, imaging at that concerning for malignancy Initial visit with oncology 7/18-reviewed PET scan-laryngeal and lung mass-possibly two separate primaries S/p cervical lymph node biopsy confirmed SCC--at follow-up visit with oncology 8/4 Ms. Strange adamant about pursuing treatment understanding treatment would be a palliative approach versus curative in nature and would require trach/PEG placement   Tracheostomy, G-tube and port placed 8/13   Place on bipap 8/16 AM due to ABG results and new onset confusion   Palliative medicine was consulted for assisting with goals of care conversations.  Summary of counseling/coordination of care: Extensive chart review completed prior to meeting patient including labs, vital signs, imaging, progress notes, orders, and available advanced directive  documents from current and previous encounters.   After reviewing the patient's chart and assessing the patient at bedside, I spoke with patient in regards to symptom management and goals of care.   Ill-appearing female sitting upright in bed.  She is unable to speak but mouths responses to questions or writes them on paper.  She is alert and oriented to self, time, location and situation.  She continually states she wants to go home and wants to sign papers to leave.  Even, unlabored respirations.  She is in no distress.  RN at bedside.  Patient states numerous times during visit she wants to go home.  She is not interested in discussing CODE STATUS or continuing treatment plan.  She does endorse that she is a DNR but is open to ventilator if needed.  During the course of the visit patient became agitated and continuing to write on paper her desire to go home as answer to all questions.  She writes she wants to sign papers to leave.  Discussed she would have to talk with her physician about going home.  Therapeutic silence and active listening provided for patient to share her thoughts and emotions regarding current medical situation.  Emotional support provided.  Physical Exam Vitals reviewed.  Constitutional:      General: She is not in acute distress.    Appearance: She is ill-appearing.     Comments: Frail, cachectic  HENT:     Head: Normocephalic and atraumatic.     Mouth/Throat:     Mouth: Mucous membranes are moist.     Comments: Tracheostomy Pulmonary:  Effort: Pulmonary effort is normal. No respiratory distress.  Skin:    General: Skin is warm and dry.  Neurological:     Mental Status: She is alert and oriented to person, place, and time.     Motor: No weakness.  Psychiatric:        Mood and Affect: Mood normal.        Behavior: Behavior normal.    Recommendations/Plan: Continue DNR with intervention Continue current supportive interventions PMT will continue to  follow for goals of care conversations      Total Time 50 minutes   Discussed plan of care with [facility] liaison.  Time spent includes: Detailed review of medical records (labs, imaging, vital signs), medically appropriate exam (mental status, respiratory, cardiac, skin), discussed with treatment team, counseling and educating patient, family and staff, documenting clinical information, medication management and coordination of care.     Devere Sacks, AMANDA [facility] Palliative Medicine Team  10/06/2023 8:41 AM  Office [phone]  Pager [phone]

## 2023-10-06 NOTE — Progress Notes (Signed)
 NAME:  Whitney [name], MRN:  [id number], DOB:  January 20, 1971, LOS: 4 ADMISSION DATE:  10/02/2023, CONSULTATION DATE:  10/02/2023 REFERRING MD:  Dr. Blair, CHIEF COMPLAINT:  Post-op management of Tracheostomy, medical management   Brief Pt Description / Synopsis:  53 y.o. female with PMHx most significant for COPD and HIV, presented for elective Tracheostomy for airway protection due to metastatic Head and Neck Cancer, along with G-tube and Chest port placement.    History of Present Illness:  Whitney [name] is a 53 y.o. female with a past medical history of HIV, COPD, hypertension, hyperlipidemia, malnutrition, PTSD, stroke, anxiety, and depression who presents to [facility] on 10/02/23 for elective Tracheostomy, G-tube, and Chest port placement due to metastatic head and neck cancer.   She recently presented to the emergency room with increasing shortness of breath, along with significant amount of weight loss greater than 30 pounds as well as difficulty swallowing recently.  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   Post procedure she was transferred to Stepdown/ICU for close monitoring.  PCCM asked to consult for medical management post procedure.  Please see Significant Hospital Events section below for full detailed hospital course.  10/05/23- patient is up sitting in bed.  She is using tracheostomy.  She is meeting with palliative care.  We may need to get MRI brain to check for brain mets.  10/06/23- patient is on medical floor requesting to go home and states she will leave against medical advice.  She is being seen by [facility] service and does have outpatient pulmonary doctor already.   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   Pertinent  Medical History   Past Medical History:  Diagnosis Date   Anemia    Anxiety    COPD (chronic obstructive pulmonary disease) (HCC)     Depression    GERD (gastroesophageal reflux disease)    History of methicillin resistant staphylococcus aureus (MRSA) 2010   HIV disease (HCC)    dx in 2010   Hyperlipidemia    Hypertension    Lung mass    Malnutrition (HCC)    Marijuana use    Neoplasm of larynx    Pneumonia    Psoriasis    PTSD (post-traumatic stress disorder)    Stroke (HCC)    Tobacco dependence    Vapes nicotine  containing substance     Micro Data:  8/13: MRSA PCR>>  Antimicrobials:   Anti-infectives (From admission, onward)    Start     Dose/Rate Route Frequency Ordered Stop   10/05/23 0800  atovaquone  (MEPRON ) 750 MG/5ML suspension 1,500 mg        1,500 mg Per Tube Daily with breakfast 10/04/23 1850     10/04/23 1530  bictegravir-emtricitabine -tenofovir  AF (BIKTARVY ) 50-200-25 MG per tablet 1 tablet       Note to Pharmacy: Dissolve in water  and give thru PEG   1 tablet Oral Daily 10/04/23 1444     10/02/23 2200  cefoTEtan  (CEFOTAN ) 2 g in sodium chloride  0.9 % 100 mL IVPB        2 g 200 mL/hr over 30 Minutes Intravenous Every 12 hours 10/02/23 1515 10/03/23 1402   10/02/23 0600  cefoTEtan  (CEFOTAN ) 2 g in sodium chloride  0.9 % 100 mL IVPB        2 g 200 mL/hr over 30 Minutes Intravenous On call to O.R. 10/01/23 2240 10/02/23 1300  Significant Hospital Events: Including procedures, antibiotic start and stop dates in addition to other pertinent events   8/13: Presented for elective Tracheostomy placement, open Gastrostomy tube placement, and chest port placement due to metastatic head and neck cancer.  Transferred to Stepdown post procedure, PCCM consulted to assist with management.  Interim History / Subjective:  As outlined above under Significant Hospital Events section  Objective   Blood pressure 122/86, pulse 78, temperature (!) 97.5 F (36.4 C), resp. rate 19, height 5' 4 (1.626 m), weight 36.6 kg, last menstrual period 08/29/2014, SpO2 97%.    FiO2 (%):  [28 %] 28 %    Intake/Output Summary (Last 24 hours) at 10/06/2023 0827 Last data filed at 10/05/2023 2244 Gross per 24 hour  Intake 400 ml  Output --  Net 400 ml   Filed Weights   10/03/23 0702 10/05/23 0500 10/06/23 0600  Weight: 31.2 kg 31.2 kg 36.6 kg    Examination: General: Acute on chronically ill appearing cachectic frail female, laying in bed, anxious, in NAD HENT: Atraumatic, normocephalic, neck supple, newly placed Tracheostomy in place midline Lungs: Clear breath sounds throughout, even, non-labored Cardiovascular: RRR, no M/R/G Abdomen: Soft, tender to palpation, midline abdominal abdominal dermabond incision clean, dry, intact, and well approximated with G-tube Extremities: Cachectic, no defotmities, no edema, no cyanosis, good peripheral perfusion Neuro: Awake and alert GU: Deferred  Resolved Hospital Problem list     Assessment & Plan:   #SQ CELL CA OF LUNGS AND HEAND & NECK CANCER METASTASIS  status post Tracheostomy for airway protection #Lung Mass #COPD without acute exacerbation  -Supplemental O2 as needed to maintain O2 sats 88 to 92% -Follow intermittent Chest X-ray & ABG as needed -Bronchodilators prn -Continue home Advair and Spiriva  equivalents (Breo Ellipta  and Incruse Ellipta ) -Pulmonary toilet as able -Now that she has tracheostomy, Bronchoscopy may can be considered ~ will discuss and defer to Dr. Parris  #AIDS  Cd4 138     - BAL WITH 60,000 COLONIES/mL ACINETOBACTER CALCOACETICUS/BAUMANNII COMPLEX 80,000 COLONIES/mL EAR 50,000 COLONIES/mL HAEMOPHILUS INFLUENZAE BETA LACTAMASE NEGATIVE SUSCEPTIBILITIES TO FOLLOW CULTURE REINCUBATED FOR BETTER GROWTH Performed at [facility], [address] [place], [place] [address] Report Status  PENDING Organism ID, Bacteria ACINETOBACTER CALCOACETICUS/BAUMANNII COMPLEX   - INFECTIOUS DISEASE SPECIALIST ON CASE TO TREAT OPPORTUNISTIC INFECTIONS AND START AIDS THERAPY WHEN APPROPRIATE  -Monitor fever  curve -Trend WBC's  -Follow cultures as above -It is reported she does not take her Biktarvy  nor Triumeq  #Anxiety #Depression PMHx: Stroke -Treatment of metabolic derangements as outlined above -Provide supportive care -Promote normal sleep/wake cycle and family presence -Avoid sedating medications as able -Pain control -Prn Valium  for anxiety     Best Practice (right click and Reselect all SmartList Selections daily)   Diet/type: NPO until G-tube cleared for use by Surgery (may use in am on 8/14) DVT prophylaxis: SCD (may resume chemical DVT ppx on 8/14 per Surgery) GI prophylaxis: PPI Lines: N/A Foley:  N/A Code Status:  full code Last date of multidisciplinary goals of care discussion [N/A]   Labs   CBC: Recent Labs  Lab 10/02/23 1558 10/02/23 1852 10/03/23 0544 10/04/23 0802  WBC 5.9 9.0 11.0* 7.9  NEUTROABS  --  7.9*  --   --   HGB 13.3 13.0 12.9 11.7*  HCT 43.5 42.0 43.2 39.3  MCV 85.5 85 85.7 85.1  PLT 172 187 174 181    Basic Metabolic Panel: Recent Labs  Lab 10/02/23 1558 10/03/23 0544 10/04/23  0802 10/05/23 0648 10/06/23 0508  NA 144 142 142 142 144  K 4.8 4.4 4.1 4.1 4.6  CL 101 101 105 102 106  CO2 34* 33* 32 34* 38*  GLUCOSE 112* 65* 98 95 72  BUN 34* 42* 46* 39* 38*  CREATININE 0.99 1.02* 0.90 0.72 0.57  CALCIUM 11.8* 11.3* 10.0 9.5 9.3  MG  --  2.8* 2.5* 2.4 2.6*  PHOS 5.0* 5.9* 3.8 2.4* 1.5*   GFR: Estimated Creatinine Clearance: 45.9 mL/min (by C-G formula based on SCr of 0.57 mg/dL). Recent Labs  Lab 10/02/23 1558 10/02/23 1852 10/03/23 0544 10/04/23 0802  WBC 5.9 9.0 11.0* 7.9    Liver Function Tests: Recent Labs  Lab 10/02/23 1558 10/03/23 0544 10/04/23 0802 10/05/23 0648 10/06/23 0508  ALBUMIN 2.7* 2.6* 2.1* 2.1* 2.0*   No results for input(s): LIPASE, AMYLASE in the last 168 hours. No results for input(s): AMMONIA in the last 168 hours.  ABG    Component Value Date/Time   PHART 7.39 10/05/2023  0939   PCO2ART 62 (H) 10/05/2023 0939   PO2ART 74 (L) 10/05/2023 0939   HCO3 37.5 (H) 10/05/2023 0939   O2SAT 96.4 10/05/2023 0939     Coagulation Profile: No results for input(s): INR, PROTIME in the last 168 hours.  Cardiac Enzymes: No results for input(s): CKTOTAL, CKMB, CKMBINDEX, TROPONINI in the last 168 hours.  HbA1C: No results found for: HGBA1C  CBG: Recent Labs  Lab 10/05/23 1137 10/05/23 1604 10/05/23 2116 10/06/23 0030 10/06/23 0551  GLUCAP 85 175* 207* 210* 74    Review of Systems:   Unable to assess due to Tracheostomy    Past Medical History:  She,  has a past medical history of Anemia, Anxiety, COPD (chronic obstructive pulmonary disease) (HCC), Depression, GERD (gastroesophageal reflux disease), History of methicillin resistant staphylococcus aureus (MRSA) (2010), HIV disease (HCC), Hyperlipidemia, Hypertension, Lung mass, Malnutrition (HCC), Marijuana use, Neoplasm of larynx, Pneumonia, Psoriasis, PTSD (post-traumatic stress disorder), Stroke (HCC), Tobacco dependence, and Vapes nicotine  containing substance.   Surgical History:   Past Surgical History:  Procedure Laterality Date   CESAREAN SECTION     CREATION, GASTROSTOMY, OPEN N/A 10/02/2023   Procedure: CREATION, GASTROSTOMY, OPEN;  Surgeon: Jordis Laneta FALCON, MD;  Location: [facility] ORS;  Service: General;  Laterality: N/A;  G-tube in place   ESOPHAGOGASTRODUODENOSCOPY (EGD) WITH PROPOFOL  N/A 12/13/2022   Procedure: ESOPHAGOGASTRODUODENOSCOPY (EGD) WITH PROPOFOL ;  Surgeon: Therisa Bi, MD;  Location: [facility] ENDOSCOPY;  Service: Gastroenterology;  Laterality: N/A;   PORTACATH PLACEMENT Left 10/02/2023   Procedure: INSERTION, TUNNELED CENTRAL VENOUS DEVICE, WITH PORT;  Surgeon: Jordis Laneta FALCON, MD;  Location: [facility] ORS;  Service: General;  Laterality: Left;   TRACHEOSTOMY TUBE PLACEMENT N/A 10/02/2023   Procedure: CREATION, TRACHEOSTOMY;  Surgeon: Blair Mt, MD;  Location: [facility] ORS;  Service:  ENT;  Laterality: N/A;  AWAKE TRACHEOSTOMY   TUBAL LIGATION       Social History:   reports that she has been smoking cigarettes. She has a 18 pack-year smoking history. She has never used smokeless tobacco. She reports that she does not currently use alcohol. She reports current drug use. Drug: Marijuana.   Family History:  Her family history includes CAD in her father; Hypertension in her mother; Parkinson's disease in her father.   Allergies Allergies  Allergen Reactions   Penicillins Hives   Aspirin Rash and Other (See Comments)    Reaction:  GI upset      Home Medications  Prior to Admission  medications   Medication Sig Start Date End Date Taking? Authorizing Provider  acetaminophen  (TYLENOL  CHILDRENS) 160 MG/5ML suspension Take 15.6 mLs (500 mg total) by mouth every 6 (six) hours as needed. 09/01/23  Yes Clarine Ozell LABOR, MD  albuterol  (ACCUNEB ) 1.25 MG/3ML nebulizer solution Take 3 mLs (1.25 mg total) by nebulization every 6 (six) hours as needed for wheezing. 09/01/23  Yes Clarine Ozell LABOR, MD  alum & mag hydroxide-simeth (MAALOX/MYLANTA) 200-200-20 MG/5ML suspension Take 15 mLs by mouth every 6 (six) hours as needed for indigestion or heartburn.   Yes [provider]  feeding supplement, ENSURE ENLIVE, (ENSURE ENLIVE) LIQD Take 237 mLs by mouth 2 (two) times daily between meals. 10/02/14  Yes Vaickute, Rima, MD  Fluticasone -Salmeterol (ADVAIR) 250-50 MCG/DOSE AEPB Inhale 1 puff into the lungs 2 (two) times daily.   Yes [provider]  lidocaine  (XYLOCAINE ) 2 % solution Use as directed 15 mLs in the mouth or throat as needed for mouth pain. 09/01/23  Yes Clarine Ozell LABOR, MD  LORazepam  (ATIVAN ) 0.5 MG tablet Take 0.5 tablets (0.25 mg total) by mouth every 8 (eight) hours as needed for anxiety. 09/23/23  Yes Jacobo Evalene PARAS, MD  Morphine  Sulfate (MORPHINE  CONCENTRATE) 10 mg / 0.5 ml concentrated solution Take 0.25 mLs (5 mg total) by mouth every 6 (six) hours as  needed. 09/06/23  Yes Borders, Fonda SAUNDERS, NP  ondansetron  (ZOFRAN -ODT) 4 MG disintegrating tablet Take 1 tablet (4 mg total) by mouth every 8 (eight) hours as needed for nausea or vomiting. 09/23/23  Yes Jacobo Evalene PARAS, MD  Phenylephrine -DM-GG-APAP 5-10-200-325 MG/15ML LIQD Take 15 mLs by mouth daily as needed (allergies).   Yes [provider]  tiotropium (SPIRIVA ) 18 MCG inhalation capsule Place 18 mcg into inhaler and inhale daily.   Yes [provider]  triamcinolone ointment (KENALOG) 0.1 % Apply 1 Application topically daily as needed (irritation). 03/30/21  Yes [provider]  abacavir -dolutegravir -lamiVUDine  (TRIUMEQ) 600-50-300 MG tablet Take 1 tablet by mouth daily. Patient not taking: Reported on 10/01/2023    [provider]  bictegravir-emtricitabine -tenofovir  AF (BIKTARVY ) 50-200-25 MG TABS tablet Take 1 tablet by mouth daily. Patient not taking: Reported on 10/01/2023    [provider]  ezetimibe (ZETIA) 10 MG tablet Take 10 mg by mouth daily. 04/06/21   [provider]  famotidine  (PEPCID ) 20 MG tablet Take 1 tablet (20 mg total) by mouth daily. 09/01/23 10/01/23  Clarine Ozell LABOR, MD  ferrous sulfate  325 (65 FE) MG tablet Take 1 tablet (325 mg total) by mouth 2 (two) times daily with a meal. Patient not taking: Reported on 12/06/2022 01/30/16   Edelmiro Leash, MD  levofloxacin  (LEVAQUIN ) 750 MG tablet Take 1 tablet (750 mg total) by mouth daily. Patient not taking: Reported on 09/27/2023 09/12/14   Barbette Cea, MD  methylPREDNISolone  (MEDROL  DOSEPAK) 4 MG TBPK tablet follow package directions Patient not taking: Reported on 12/06/2022 10/02/14   Vaickute, Rima, MD  naloxone  (NARCAN ) nasal spray 4 mg/0.1 mL SPRAY 1 SPRAY INTO ONE NOSTRIL AS DIRECTED FOR OPIOID OVERDOSE (TURN PERSON ON SIDE AFTER DOSE. IF NO RESPONSE IN 2-3 MINUTES OR PERSON RESPONDS BUT RELAPSES, REPEAT USING A NEW SPRAY DEVICE AND SPRAY INTO THE OTHER NOSTRIL. CALL  [phone] AFTER USE.) * EMERGENCY USE ONLY * Patient not taking: No sig reported 09/06/23   Borders, Fonda SAUNDERS, NP  pantoprazole  (PROTONIX ) 40 MG tablet Take 1 tablet (40 mg total) by mouth 2 (two) times daily. Patient not taking: No sig  reported 12/13/22 12/13/23  Therisa Bi, MD      Halina Picking, M.D.  Pulmonary & Critical Care Medicine

## 2023-10-06 NOTE — Progress Notes (Signed)
 [facility] Liaison Note  Received a message today from Dr. Delon Herald inquiring if patient was followed by our agency for hospice.  When looking patient up in our system- patient is a new palliative referral with initial visit scheduled for 8.20.25.  Patient wanting to sign out AMA.  [name] children stopped patient from leaving as patient/family need education related to new trach and G-tube as well as DME set up and in home prior to discharge.  Palliative Care Consult  is ongoing to finish goals of care conversation.     Hospital liaison team will follow up once GOC are clear.  Patient was seeking aggressive treatment/chemo and why surgery was scheduled.  We will continue coordination and collaboration with patient and hospital team once GOC are clear and we are able to coordinate a safe discharge (if to hospice) plan with patient having needed/required DME in place along with supplies needed.  Please do not hesitate to call hospital liaison team with any further needs, questions or concerns.  Saddie HILARIO Na, RN Nurse Liaison [phone]

## 2023-10-06 NOTE — Progress Notes (Signed)
 Progress Note   Patient: Whitney [name] FMW:[id number] DOB: [birth year] DOA: 10/02/2023     4 DOS: the patient was seen and examined on 10/06/2023   Brief hospital course: 53yo with h/o HIV, COPD, hypertension, hyperlipidemia, malnutrition, PTSD, stroke, anxiety, and depression who presented on 10/02/23 for elective Tracheostomy, G-tube, and Chest port placement due to metastatic head and neck SCC. Post procedure she was transferred to Stepdown/ICU for close monitoring. Tracheostomy and bronchoscopy with BAL and biopsy were done on 10/03/2023.  Patient with very poor long-term prognosis.  Palliative care was also consulted.   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, and there is also concern of a primary lung cancer with a lung mass which was biopsied  and is pending results MRI brain negative for any metastatic disease Oncology recommended hospice but patient wants to do everything possible, s/p trach and PEG tube placement as recommended by oncology before starting any treatment Trach collar in place with minimal O2 requirements Patient has very poor prognosis Follow-up with outpatient oncology after lung lesion pathology results Palliative care from cancer center is on board Palliative care in hospital was also consulted, appears to be moving toward comfort care   HIV disease Patient was not taking her HIV medication for some time due to worsening dysphagia secondary to laryngeal cancer CD4 count and BAL cultures are pending ID was consulted to restart HIV medications as it has to be optimized before concentration of any oncologic treatment Patient is high risk for opportunistic infections and immune reconstitution syndrome ID restarted Biktarvy    Protein-calorie malnutrition, severe Patient with severe cachexia, significant  recent weight loss which seems multifactorial with her untreated HIV and new diagnosis of metastatic malignancy She was having significant dysphagia and unable to eat S/p PEG tube placement and she was started on tube feeds High risk for refeeding syndrome Continue with tube feeds as recommended by dietitian   Anemia Anemia panel consistent with anemia of chronic disease with iron  and folic acid  deficiency, B12 WNL Started on supplement   COPD (chronic obstructive pulmonary disease)  No current wheezing S/p tracheostomy for advanced laryngeal cancer Continue Advair, Spiriva , Albuterol    Hyperlipidemia Home Zetia has been held for a while due to worsening dysphagia, will continue to hold Reasonable lipid control Would not add additional medications for this issue, as she has other more life-threatening issues at this time   [facility] Palliative care is consulting She is now DNR Ongoing discussion regarding comfort care is appropriate This AM, she was demanding discharge and threatening to leave AMA if not discharged by 2pm She reports that she is planning for home hospice; I have reached out to Saddie Na (she is already enrolled in outpatient palliative care with [facility]) Will need outpatient palliative care vs. hospice She needs trach collar O2 at the time of dc and so discharge will be held until then, likely 8/18 Home PT/OT recommended, but will defer for now due to patient's confirmed plan for home hospice           Consultants: PCCM Surgery  ENT ID Palliative care PT OT [facility] team   Procedures: Tracheostomy 8/13 L internal jugular 8/13 Gastrostomy tube 8/13   Antibiotics: Cefotetan  x 3 doses  30 Day Unplanned Readmission Risk Score    Flowsheet Row Admission (Current) from 10/02/2023 in [facility]U  30 Day  Unplanned Readmission Risk Score (%) 15.46 Filed at 10/06/2023 0801    This score is the patient's risk of an unplanned readmission  within 30 days of being discharged (0 -100%). The score is based on dignosis, age, lab data, medications, orders, and past utilization.   Low:  0-14.9   Medium: 15-21.9   High: 22-29.9   Extreme: 30 and above           Subjective: Patient looks stable and reports that she feels better. She is insistent on discharge today and will leave AMA if not discharged.  However, she needs HH and O2 set up and would not be safe to leave without this.  After discussions with family, she is reluctantly agreeing to stay.   Objective: Vitals:   10/06/23 0600 10/06/23 0614  BP:    Pulse: 78   Resp: 19   Temp:  (!) 97.5 F (36.4 C)  SpO2: 97%     Intake/Output Summary (Last 24 hours) at 10/06/2023 0830 Last data filed at 10/05/2023 2244 Gross per 24 hour  Intake 400 ml  Output --  Net 400 ml   Filed Weights   10/03/23 0702 10/05/23 0500 10/06/23 0600  Weight: 31.2 kg 31.2 kg 36.6 kg    Exam:  General:  Appears calm and comfortable and is in NAD; frail, chronically ill-appearing, cachectic Eyes:  normal lids, iris ENT:  grossly normal hearing, lips & tongue, mmm Neck:  trach in place with trach collar O2 Cardiovascular:  RRR. No LE edema.  Respiratory:   Scattered upper airway noise vs. Subtle rhonchi.  Normal respiratory effort. Abdomen:  soft, NT, ND Skin:  no rash or induration seen on limited exam Musculoskeletal:  grossly normal tone BUE/BLE, good ROM, no bony abnormality Psychiatric:  grossly normal mood and affect, unable to speak with trach but clearly mouths words appropriately and writes anything I could not understand Neurologic:  CN 2-12 grossly intact, moves all extremities in coordinated fashion  Data Reviewed: I have reviewed the patient's lab results since admission.  Pertinent labs for today include:   CO2 38 Phosphorus 1.5 Albumin 2.0     Family Communication: None present; RN has been communicating with them throughout the afternoon  Disposition: Status is:  Inpatient Remains inpatient appropriate because: ongoing management     Time spent: 50 minutes  Unresulted Labs (From admission, onward)     Start     Ordered   10/04/23 1311  Fungitell Beta-D-Glucan  Once,   R       Question:  Specimen collection method  Answer:  Lab=Lab collect   10/04/23 1310   10/04/23 1302  GenoSure Prime  Once,   R       Question:  Specimen collection method  Answer:  Lab=Lab collect   10/04/23 1302   10/04/23 0500  Renal function panel  Daily,   R     Question:  Specimen collection method  Answer:  Lab=Lab collect   10/03/23 1211   10/03/23 1256  Virus culture  Once,   R        10/03/23 1255   10/03/23 1255  Acid Fast Smear (AFB)  (AFB smear + Culture w reflexed sensitivities with precautions panel)  Once,   R       Placed in And Linked Group   10/03/23 1255   10/03/23 1255  Acid Fast Culture with reflexed sensitivities  (AFB smear + Culture w reflexed sensitivities with precautions panel)  Once,   R  Placed in And Linked Group   10/03/23 1255             Recommendations at discharge:    You are being discharged home with home hospice Home O2 is to be arranged prior to dc Follow up with oncology to discuss biopsy results and plan of care Continue morphine  (now by tube rather than by mouth) as needed for pain Take atovaquone  and Biktarvy  by tube; follow up with infectious disease Hold Zetia for now Use Protonix  twice daily per tube Use Miralax  per tube as needed for constipation Take folate, multivitamin, and iron  (Niferex) by tube daily Tube feeds are Osmolite 1 can 4 times daily with 100cc free water  with each feed Follow up with surgery on 8/27 as scheduled Follow up with Dr. Ernie or hospice physician Follow up with Dr. Blair (ENT) on Wednesday, 8/20, for suture removal (call for an appointment)   Author: Delon Herald, MD 10/06/2023 8:30 AM  For on call review [email or web address].

## 2023-10-06 NOTE — Progress Notes (Signed)
 Physical Therapy Treatment Patient Details Name: Whitney [name] MRN: [id number] DOB: [birth year] Today's Date: 10/06/2023   History of Present Illness Pt is a 53 y.o. female s/p elective tracheostomy, internal jugular port placement, and open gastrostomy procedure 10/02/23.  S/p bronchoscopy 10/03/23.  PMH includes COPD, htn, anxiety, HIV positive, PNA, PTSD, stroke, L upper lobe mass and laryngeal mass (per notes L neck mass biopsied c/w head and neck CA metastatic SCC).    PT Comments  Pt ready for session.  Assist for tubes and lines for bed mobility.  She is steady in sitting.  Hesitant to stand with +1 assist but does well.  She is able to stand x 2 trials with min a x 1 for general balance and transitions.  Self selects standing time to about 30 seconds max.    Pt stated she hopes discharge is tomorrow to home.  Family in for session and feels comfortable.  DME needs in.  Will benefit from continued therapies at home.   If plan is discharge home, recommend the following: A little help with walking and/or transfers;A little help with bathing/dressing/bathroom;Assistance with cooking/housework;Assist for transportation;Help with stairs or ramp for entrance   Can travel by private vehicle        Equipment Recommendations  Rolling walker (2 wheels);BSC/3in1;Wheelchair (measurements PT);Wheelchair cushion (measurements PT)    Recommendations for Other Services       Precautions / Restrictions Precautions Precautions: Fall Recall of Precautions/Restrictions: Intact Precaution/Restrictions Comments: Trach; gastrostomy, chest port; NPO Restrictions Weight Bearing Restrictions Per Provider Order: No     Mobility  Bed Mobility Overal bed mobility: Needs Assistance Bed Mobility: Supine to Sit, Sit to Supine     Supine to sit: Supervision, HOB elevated     General bed mobility comments: SBA for lines/leads,    Transfers Overall transfer level: Needs assistance Equipment used:  Rolling walker (2 wheels) Transfers: Sit to/from Stand Sit to Stand: Contact guard assist           General transfer comment: x 2 trials with RW for support    Ambulation/Gait               General Gait Details: no steps taken today   Stairs             Wheelchair Mobility     Tilt Bed    Modified Rankin (Stroke Patients Only)       Balance Overall balance assessment: Needs assistance Sitting-balance support: No upper extremity supported, Feet supported Sitting balance-Leahy Scale: Good     Standing balance support: Bilateral upper extremity supported, Reliant on assistive device for balance Standing balance-Leahy Scale: Fair Standing balance comment: Static standing with RW                            Communication Communication Communication: Impaired Factors Affecting Communication: Trach/intubated  Cognition Arousal: Alert Behavior During Therapy: WFL for tasks assessed/performed   PT - Cognitive impairments: No apparent impairments                         Following commands: Intact Following commands impaired: Only follows one step commands consistently    Cueing Cueing Techniques: Verbal cues, Visual cues  Exercises      General Comments        Pertinent Vitals/Pain Pain Assessment Pain Assessment: Faces Faces Pain Scale: Hurts little more Pain Location: abdomen (near incision and gastrostomy tube)  Pain Descriptors / Indicators: Grimacing, Guarding Pain Intervention(s): Limited activity within patient's tolerance, Monitored during session, Repositioned    Home Living                          Prior Function            PT Goals (current goals can now be found in the care plan section) Progress towards PT goals: Progressing toward goals    Frequency    Min 2X/week      PT Plan      Co-evaluation              AM-PAC PT 6 Clicks Mobility   Outcome Measure  Help needed  turning from your back to your side while in a flat bed without using bedrails?: None Help needed moving from lying on your back to sitting on the side of a flat bed without using bedrails?: None Help needed moving to and from a bed to a chair (including a wheelchair)?: A Little Help needed standing up from a chair using your arms (e.g., wheelchair or bedside chair)?: A Little Help needed to walk in hospital room?: A Little Help needed climbing 3-5 steps with a railing? : A Lot 6 Click Score: 19    End of Session Equipment Utilized During Treatment: Oxygen Activity Tolerance: Patient tolerated treatment well Patient left: in bed;with call bell/phone within reach;with bed alarm set;with family/visitor present Nurse Communication: Mobility status PT Visit Diagnosis: Other abnormalities of gait and mobility (R26.89);Muscle weakness (generalized) (M62.81);Pain     Time: 8546-8487 PT Time Calculation (min) (ACUTE ONLY): 19 min  Charges:    $Therapeutic Activity: 8-22 mins PT General Charges $$ ACUTE PT VISIT: 1 Visit                   Lauraine Gills, PTA 10/06/23, 3:32 PM

## 2023-10-06 NOTE — Progress Notes (Signed)
 PHARMACY CONSULT NOTE - FOLLOW UP  Pharmacy Consult for Electrolyte Monitoring and Replacement   Recent Labs: Potassium (mmol/L)  Date Value  10/06/2023 4.6   Magnesium  (mg/dL)  Date Value  91/82/7974 2.6 (H)   Calcium (mg/dL)  Date Value  91/82/7974 9.3   Albumin (g/dL)  Date Value  91/82/7974 2.0 (L)   Phosphorus (mg/dL)  Date Value  91/82/7974 1.5 (L)   Sodium (mmol/L)  Date Value  10/06/2023 144     Assessment: 53 y/o female with h/o COPD, HIV, depression, anxiety, GERD, gonorrhea, neuropathy, stroke, HTN, marijuana use, IDA, esophageal stenosis s/p dilation 11/2022 and newly diagnosed SCC with noted pharyngeal and lung masses with resulting dysphagia who is s/p tracheostomy, port and surgical G-tube placement 8/13. Pharmacy is asked to follow and replace electrolytes  Goal of Therapy:  Electrolytes WNL  Plan:  ---30 mmol IV sodium phosphate  x 1 ---recheck electrolytes in am  Adriana JONETTA Bolster ,PharmD Clinical Pharmacist 10/06/2023 8:51 AM

## 2023-10-06 NOTE — TOC Progression Note (Signed)
 Transition of Care [facility]) - Progression Note    Patient Details  Name: Whitney [name] MRN: [id number] Date of Birth: May 05, 1970  Transition of [facility] CM/SW Contact  Marinda Cooks, RN Phone Number: 10/06/2023, 5:09 PM  Clinical Narrative:    This CM Alerted pt was attempting to lv hospital AMA today, however family convinced pt to stay for further coordination of care . Informed pt tentatively consulted with Palliative Care at dc and has  DME needs for trach.  TOC will cont to follow dc planning/ care coordination  as applicable.      Barriers to Discharge: Continued Medical Work up     Expected Discharge Plan and Services       Living arrangements for the past 2 months: Apartment                     Social Drivers of Health (SDOH) Interventions SDOH Screenings   Food Insecurity: No Food Insecurity (10/02/2023)  Housing: Low Risk  (10/02/2023)  Transportation Needs: No Transportation Needs (10/02/2023)  Recent Concern: Transportation Needs - Unmet Transportation Needs (09/06/2023)  Utilities: Not At Risk (10/02/2023)  Depression (PHQ2-9): Medium Risk (09/06/2023)  Financial Resource Strain: Medium Risk (09/09/2023)  Tobacco Use: High Risk (10/02/2023)    Readmission Risk Interventions     No data to display

## 2023-10-06 NOTE — Plan of Care (Signed)
   Problem: Clinical Measurements: Goal: Ability to maintain clinical measurements within normal limits will improve Outcome: Progressing

## 2023-10-06 NOTE — Plan of Care (Signed)

## 2023-10-07 DIAGNOSIS — R262 Difficulty in walking, not elsewhere classified: Secondary | ICD-10-CM | POA: Diagnosis not present

## 2023-10-07 DIAGNOSIS — C329 Malignant neoplasm of larynx, unspecified: Secondary | ICD-10-CM | POA: Diagnosis not present

## 2023-10-07 DIAGNOSIS — M6281 Muscle weakness (generalized): Secondary | ICD-10-CM | POA: Diagnosis not present

## 2023-10-07 LAB — CULTURE, BAL-QUANTITATIVE W GRAM STAIN: Culture: 60000 — AB

## 2023-10-07 LAB — RENAL FUNCTION PANEL
Albumin: 2 g/dL — ABNORMAL LOW (ref 3.5–5.0)
Anion gap: 10 (ref 5–15)
BUN: 31 mg/dL — ABNORMAL HIGH (ref 6–20)
CO2: 31 mmol/L (ref 22–32)
Calcium: 8.8 mg/dL — ABNORMAL LOW (ref 8.9–10.3)
Chloride: 106 mmol/L (ref 98–111)
Creatinine, Ser: 0.49 mg/dL (ref 0.44–1.00)
GFR, Estimated: >60 mL/min (ref >60)
Glucose, Bld: 86 mg/dL (ref 70–99)
Phosphorus: 2 mg/dL — ABNORMAL LOW (ref 2.5–4.6)
Potassium: 4.1 mmol/L (ref 3.5–5.1)
Sodium: 147 mmol/L — ABNORMAL HIGH (ref 135–145)

## 2023-10-07 LAB — GLUCOSE, CAPILLARY
Glucose-Capillary: 156 mg/dL — ABNORMAL HIGH (ref 70–99)
Glucose-Capillary: 70 mg/dL (ref 70–99)
Glucose-Capillary: 82 mg/dL (ref 70–99)
Glucose-Capillary: 126 mg/dL — ABNORMAL HIGH (ref 70–99)
Glucose-Capillary: 95 mg/dL (ref 70–99)

## 2023-10-07 LAB — MAGNESIUM: Magnesium: 2.7 mg/dL — ABNORMAL HIGH (ref 1.7–2.4)

## 2023-10-07 MED ORDER — FLUCONAZOLE 100 MG PO TABS
100.0000 mg | ORAL_TABLET | Freq: Every day | ORAL | Status: DC
  Administered 2023-10-07 – 2023-10-08 (×2): 100 mg
  Filled 2023-10-07 (×2): qty 1

## 2023-10-07 MED ORDER — MORPHINE SULFATE (CONCENTRATE) 10 MG /0.5 ML PO SOLN
5.0000 mg | Freq: Four times a day (QID) | ORAL | Status: DC | PRN
  Administered 2023-10-08 (×2): 5 mg
  Filled 2023-10-07 (×2): qty 0.5

## 2023-10-07 MED ORDER — OXYCODONE-ACETAMINOPHEN 5-325 MG PO TABS
1.0000 | ORAL_TABLET | Freq: Once | ORAL | Status: AC
  Administered 2023-10-07: 1
  Filled 2023-10-07: qty 1

## 2023-10-07 MED ORDER — MEDIHONEY WOUND/BURN DRESSING EX PSTE
1.0000 | PASTE | Freq: Every day | CUTANEOUS | Status: DC
  Administered 2023-10-08: 1 via TOPICAL
  Filled 2023-10-07 (×2): qty 44

## 2023-10-07 MED ORDER — ZINC OXIDE 40 % EX OINT
TOPICAL_OINTMENT | Freq: Two times a day (BID) | CUTANEOUS | Status: DC
  Filled 2023-10-07: qty 113

## 2023-10-07 MED ORDER — POTASSIUM PHOSPHATES 15 MMOLE/5ML IV SOLN
15.0000 mmol | Freq: Once | INTRAVENOUS | Status: AC
  Administered 2023-10-07: 15 mmol via INTRAVENOUS
  Filled 2023-10-07: qty 5

## 2023-10-07 NOTE — Progress Notes (Signed)
 Nutrition Follow-up  DOCUMENTATION CODES:   Severe malnutrition in context of chronic illness  INTERVENTION:   -TF via g-tube:  237 ml (1 carton) Osmolite 1.5 4 times daily  50 ml free water  flush before and after each feeding administration   Tube feeding regimen provides 1420 kcal (100% of needs), 60 grams of protein, and 724 ml of H2O. Total free water : 1124 ml daily  -Continue 100 mg thiamine  daily x 7 days via tube  NUTRITION DIAGNOSIS:   Severe Malnutrition related to chronic illness (cancer, COPD, HIV) as evidenced by severe fat depletion, severe muscle depletion, percent weight loss.  Ongoing  GOAL:   Patient will meet greater than or equal to 90% of their needs  Met with TF  MONITOR:   Diet advancement, Labs, Weight trends, TF tolerance, I & O's, Skin  REASON FOR ASSESSMENT:   Consult Enteral/tube feeding initiation and management  ASSESSMENT:   53 y/o female with h/o COPD, HIV, depression, anxiety, GERD, gonorrhea, neuropathy, stroke, HTN, marijuana use, IDA, esophageal stenosis s/p dilation 11/2022 and newly diagnosed SCC with noted pharyngeal and lung masses with resulting dysphagia who is s/p tracheostomy, port and surgical G-tube placement 8/13.  8/13- s/p trach, g-tube, and port placement  Reviewed I/O's: +400 ml x 24 hours and +5.5 L since admission  SLP following for [facility].   Per ONT notes, plan for transition to cuffless trach later this week.   Pt lying in bed at time of visit. Pt did not respond to name being called. No family present at time of visit.   Case discussed with TOC. Plan to discharge home today; reports that pt will need TF supplies for home. RD delivered supply of TF formula to pt room. MD, RN, and TOC aware.   Per MD, plan to discharge home with palliative care services.   Medications reviewed and include folic acid , vitamin B-12, and thiamine .   Labs reviewed: Na: 147, K WDL, Mg: 2.7, Phos: 2.0 (on IV supplementation),  CBGS: 70-126 (inpatient orders for glycemic control are none).    Diet Order:   Diet Order             Diet NPO time specified  Diet effective now                   EDUCATION NEEDS:   Education needs have been addressed  Skin:  Skin Assessment: Reviewed RN Assessment (incisions neck and abdomen)  Last BM:  8/12  Height:   Ht Readings from Last 1 Encounters:  10/02/23 5' 4 (1.626 m)    Weight:   Wt Readings from Last 1 Encounters:  10/07/23 38.7 kg    Ideal Body Weight:  54.5 kg  BMI:  Body mass index is 14.64 kg/m.  Estimated Nutritional Needs:   Kcal:  1200-1400kcal/day  Protein:  60-70g/day  Fluid:  900-112ml/day    Margery ORN, RD, LDN, CDCES Registered Dietitian III Certified Diabetes Care and Education Specialist If unable to reach this RD, please use RD Inpatient group chat on secure chat between hours of 8am-4 pm daily

## 2023-10-07 NOTE — Progress Notes (Addendum)
 PHARMACY CONSULT NOTE - FOLLOW UP  Pharmacy Consult for Electrolyte Monitoring and Replacement   Recent Labs: Potassium (mmol/L)  Date Value  10/07/2023 4.1   Magnesium  (mg/dL)  Date Value  91/81/7974 2.7 (H)   Calcium (mg/dL)  Date Value  91/81/7974 8.8 (L)   Albumin (g/dL)  Date Value  91/81/7974 2.0 (L)   Phosphorus (mg/dL)  Date Value  91/81/7974 2.0 (L)   Sodium (mmol/L)  Date Value  10/07/2023 147 (H)     Assessment: 53 y/o female with h/o COPD, HIV, depression, anxiety, GERD, gonorrhea, neuropathy, stroke, HTN, marijuana use, IDA, esophageal stenosis s/p dilation 11/2022 and newly diagnosed SCC with noted pharyngeal and lung masses with resulting dysphagia who is s/p tracheostomy, port and surgical G-tube placement 8/13. Pharmacy is asked to follow and replace electrolytes  Goal of Therapy:  Electrolytes WNL  Plan:  Kphos 15 mmol IV x 1  F/u with AM labs.   Whitney [name] ,PharmD Clinical Pharmacist 10/07/2023 6:56 AM

## 2023-10-07 NOTE — Progress Notes (Signed)
 Palliative Care Progress Note, Assessment & Plan   Patient Name: Whitney [name]       Date: 10/07/2023 DOB: 07/30/1970  Age: 53 y.o. MRN#: [id number] Attending Physician: Barbarann Nest, MD Primary Care Physician: Ernie Yancy Roof, MD Admit Date: 10/02/2023  Subjective: Endorses mild abdominal pain today. Slept well last night. Denies CP/SOB. She shares she is going home today.   HPI: Per previous HPI: 53 y.o. female  with past medical history of HIV-non compliant with medications, COPD, HTN, HLD, CVA, PTSD, anxiety and depression admitted from home on 10/02/2023 for elective tracheostomy, g-tube and port placement.   Presented to ED 09/01/2023-SHOB and significant weight loss, imaging at that concerning for malignancy Initial visit with oncology 7/18-reviewed PET scan-laryngeal and lung mass-possibly two separate primaries S/p cervical lymph node biopsy confirmed SCC--at follow-up visit with oncology 8/4 Ms. Seki adamant about pursuing treatment understanding treatment would be a palliative approach versus curative in nature and would require trach/PEG placement   Tracheostomy, G-tube and port placed 8/13   Place on bipap 8/16 AM due to ABG results and new onset confusion   Palliative medicine was consulted for assisting with goals of care conversations.  Summary of counseling/coordination of care: Extensive chart review completed prior to meeting patient including labs, vital signs, imaging, progress notes, orders, and available advanced directive documents from current and previous encounters.   After reviewing the patient's chart and assessing the patient at bedside, I spoke with patient in regards to symptom management and goals of care.   Ill-appearing, older than stated age female  sitting upright in bed.  She is able to mouth responses to questions or write them on paper.  She is alert and oriented and able to participate in conversation.  Even, unlabored respirations.  She is in no distress.  Ms. Anstine is more amicable to goals of care conversation today. She confirms that she is ready to go home with palliative care in place.  She shares that she has family support in the home with her 2 daughters that live with her.  She is very happy about being able to go home today.  Advised equipment that she will need in the home will be delivered.  Therapeutic silence and active listening provided for patient to share her thoughts and emotions regarding current medical situation.  Emotional support provided.  Physical Exam Constitutional:      General: She is not in acute distress.    Appearance: She is ill-appearing.     Comments: Frail, cachectic  HENT:     Mouth/Throat:     Mouth: Mucous membranes are dry.  Pulmonary:     Effort: Pulmonary effort is normal. No respiratory distress.     Comments: Tracheostomy Musculoskeletal:     Right lower leg: No edema.     Left lower leg: No edema.  Skin:    General: Skin is warm and dry.  Neurological:     Mental Status: She is alert and oriented to person, place, and time.  Psychiatric:        Mood and Affect: Mood normal.        Behavior: Behavior normal.     Recommendations/Plan: Continue DNR with  intervention Continue current supportive interventions Outpatient palliative to follow with plan for discharge today         Total Time 50 minutes   Discussed plan of care with attending, primary RN's, TOC and ACC liaison.  Time spent includes: Detailed review of medical records (labs, imaging, vital signs), medically appropriate exam (mental status, respiratory, cardiac, skin), discussed with treatment team, counseling and educating patient, family and staff, documenting clinical information, medication management and  coordination of care.     Devere Sacks, [facility] Palliative Medicine Team  10/07/2023 11:52 AM  Office [phone]  Pager [phone]

## 2023-10-07 NOTE — Plan of Care (Signed)
  Problem: Education: Goal: Knowledge of General Education information will improve Description: Including pain rating scale, medication(s)/side effects and non-pharmacologic comfort measures 10/07/2023 1610 by Arloa Dene KIDD, RN Outcome: Progressing 10/07/2023 1610 by Arloa Dene KIDD, RN Outcome: Progressing   Problem: Health Behavior/Discharge Planning: Goal: Ability to manage health-related needs will improve 10/07/2023 1610 by Arloa Dene KIDD, RN Outcome: Progressing 10/07/2023 1610 by Arloa Dene KIDD, RN Outcome: Progressing   Problem: Clinical Measurements: Goal: Ability to maintain clinical measurements within normal limits will improve 10/07/2023 1610 by Arloa Dene KIDD, RN Outcome: Progressing 10/07/2023 1610 by Arloa Dene KIDD, RN Outcome: Progressing Goal: Will remain free from infection 10/07/2023 1610 by Arloa Dene KIDD, RN Outcome: Progressing 10/07/2023 1610 by Arloa Dene KIDD, RN Outcome: Progressing Goal: Diagnostic test results will improve 10/07/2023 1610 by Arloa Dene KIDD, RN Outcome: Progressing 10/07/2023 1610 by Arloa Dene KIDD, RN Outcome: Progressing Goal: Respiratory complications will improve 10/07/2023 1610 by Arloa Dene KIDD, RN Outcome: Progressing 10/07/2023 1610 by Arloa Dene KIDD, RN Outcome: Progressing Goal: Cardiovascular complication will be avoided 10/07/2023 1610 by Arloa Dene KIDD, RN Outcome: Progressing 10/07/2023 1610 by Arloa Dene KIDD, RN Outcome: Progressing   Problem: Activity: Goal: Risk for activity intolerance will decrease 10/07/2023 1610 by Arloa Dene KIDD, RN Outcome: Progressing 10/07/2023 1610 by Arloa Dene KIDD, RN Outcome: Progressing   Problem: Nutrition: Goal: Adequate nutrition will be maintained 10/07/2023 1610 by Arloa Dene KIDD, RN Outcome: Progressing 10/07/2023 1610 by Arloa Dene KIDD, RN Outcome: Progressing   Problem: Coping: Goal: Level of anxiety will  decrease 10/07/2023 1610 by Arloa Dene KIDD, RN Outcome: Progressing 10/07/2023 1610 by Arloa Dene KIDD, RN Outcome: Progressing   Problem: Elimination: Goal: Will not experience complications related to bowel motility 10/07/2023 1610 by Arloa Dene KIDD, RN Outcome: Progressing 10/07/2023 1610 by Arloa Dene KIDD, RN Outcome: Progressing Goal: Will not experience complications related to urinary retention 10/07/2023 1610 by Arloa Dene KIDD, RN Outcome: Progressing 10/07/2023 1610 by Arloa Dene KIDD, RN Outcome: Progressing   Problem: Pain Managment: Goal: General experience of comfort will improve and/or be controlled 10/07/2023 1610 by Arloa Dene KIDD, RN Outcome: Progressing 10/07/2023 1610 by Arloa Dene KIDD, RN Outcome: Progressing   Problem: Safety: Goal: Ability to remain free from injury will improve 10/07/2023 1610 by Arloa Dene KIDD, RN Outcome: Progressing 10/07/2023 1610 by Arloa Dene KIDD, RN Outcome: Progressing   Problem: Skin Integrity: Goal: Risk for impaired skin integrity will decrease 10/07/2023 1610 by Arloa Dene KIDD, RN Outcome: Not Progressing 10/07/2023 1610 by Arloa Dene KIDD, RN Outcome: Progressing

## 2023-10-07 NOTE — Plan of Care (Signed)
 ?  Problem: Coping: ?Goal: Level of anxiety will decrease ?Outcome: Progressing ?  ?Problem: Safety: ?Goal: Ability to remain free from injury will improve ?Outcome: Progressing ?  ?

## 2023-10-07 NOTE — Progress Notes (Signed)
 Oxygen saturation dropped to 86% when patient accidentally removed tracheostomy collar. Tracheostomy collar placed back on patient with 5L oxygen, oxygen saturation back to 95%

## 2023-10-07 NOTE — Consult Note (Addendum)
 WOC Nurse Consult Note: Reason for Consult: sacral wound  Wound type: 1.  Deep Tissue Pressure Injury sacrum  2.  Stage 3 Pressure Injury Coccyx   Pressure Injury POA: Yes (see photo 10/02/2023)  Measurement:see nursing flowsheet  Wound bed: sacrum purple maroon discoloration, coccyx 60% tan 40% pink  Drainage (amount, consistency, odor) see nursing flowsheet  Periwound: erythema  Dressing procedure/placement/frequency:  Cleanse sacral wound  (purple discoloration) with NS, apply Xeroform gauze Soila [phone]) to area daily and secure with silicone foam.  Cleanse coccyx wound with NS, apply Medihoney to wound bed daily, cover with dry gauze and secure with silicone foam or ABD pad whichever is preferred.   Will write for a thin layer of Desitin to surrounding skin. Reconsult if further needs arise.   Thank you,    Powell Bar MSN, RN-BC, [employer]

## 2023-10-07 NOTE — Progress Notes (Signed)
 Patient ID: Whitney [name], female   DOB: 1970-03-05, 53 y.o.   MRN: [id number] Damian, Hofstra [id number] Dec 02, 1970 Deward GORMAN Dolly, MD   SUBJECTIVE: This 53 y.o. year old female is status post PR TRACHEOSTOMY PLANNED SEPARATE PROCEDURE [31600] PR EGD PERCUTANEOUS PLACEMENT GASTROSTOMY TUBE [43246] PR INSJ TUNNELED CTR VAD W/SUBQ PORT AGE 58 YR/> [36561]. Patient has been stable with respect to trach. Cuff deflated and seems to be tolerating well. Family notes some confusion today however.   Medications:  Current Facility-Administered Medications  Medication Dose Route Frequency Provider Last Rate Last Admin   atovaquone  (MEPRON ) 750 MG/5ML suspension 1,500 mg  1,500 mg Per Tube Q breakfast Fayette Bodily, MD   1,500 mg at 10/07/23 1007   bictegravir-emtricitabine -tenofovir  AF (BIKTARVY ) 50-200-25 MG per tablet 1 tablet  1 tablet Oral Daily Fayette Bodily, MD   1 tablet at 10/07/23 1007   Chlorhexidine  Gluconate Cloth 2 % PADS 6 each  6 each Topical Daily Aleskerov, Fuad, MD   6 each at 10/07/23 1100   diazepam  (VALIUM ) injection 2.5-5 mg  2.5-5 mg Intravenous Q6H PRN Rust-Chester, Britton L, NP   5 mg at 10/07/23 1034   docusate (COLACE) 50 MG/5ML liquid 100 mg  100 mg Per Tube BID PRN Keene, Jeremiah D, NP       feeding supplement (OSMOLITE 1.5 CAL) liquid 237 mL  237 mL Per Tube QID Parris Manna, MD   237 mL at 10/07/23 1004   fluticasone  furoate-vilanterol (BREO ELLIPTA ) 200-25 MCG/ACT 1 puff  1 puff Inhalation Daily Keene, Jeremiah D, NP   1 puff at 10/07/23 1008   folic acid  (FOLVITE ) tablet 1 mg  1 mg Per Tube Daily Amin, Sumayya, MD   1 mg at 10/07/23 1007   free water  100 mL  100 mL Per Tube QID Parris Manna, MD   100 mL at 10/07/23 1008   ipratropium-albuterol  (DUONEB) 0.5-2.5 (3) MG/3ML nebulizer solution 3 mL  3 mL Nebulization Q4H PRN Keene, Jeremiah D, NP       iron  polysaccharides (NIFEREX) capsule 150 mg  150 mg Per Tube Daily Amin, Sumayya, MD   150 mg  at 10/07/23 1006   ketorolac  (TORADOL ) 15 MG/ML injection 15 mg  15 mg Intravenous Q6H Pabon, Diego F, MD   15 mg at 10/07/23 0519   lip balm (CARMEX) ointment 1 Application  1 Application Topical PRN Rust-Chester, Jenita CROME, NP       morphine  10 MG/5ML solution 5 mg  5 mg Per Tube Q6H PRN Rust-Chester, Jenita CROME, NP   5 mg at 10/04/23 0327   multivitamin with minerals tablet 1 tablet  1 tablet Per Tube Daily Amin, Sumayya, MD   1 tablet at 10/07/23 1008   ondansetron  (ZOFRAN ) injection 4 mg  4 mg Intravenous Q6H PRN Keene, Jeremiah D, NP       pantoprazole  (PROTONIX ) injection 40 mg  40 mg Intravenous Q24H Keene, Jeremiah D, NP   40 mg at 10/06/23 1703   polyethylene glycol (MIRALAX  / GLYCOLAX ) packet 17 g  17 g Per Tube Daily PRN Keene, Jeremiah D, NP       potassium PHOSPHATE  15 mmol in dextrose  5 % 250 mL infusion  15 mmol Intravenous Once Patel, Kishan S, RPH       thiamine  (VITAMIN B1) tablet 100 mg  100 mg Per Tube Daily Aleskerov, Fuad, MD   100 mg at 10/07/23 1005   umeclidinium bromide  (INCRUSE ELLIPTA ) 62.5 MCG/ACT 1 puff  1 puff  Inhalation Daily Keene, Jeremiah D, NP   1 puff at 10/07/23 1008   vitamin B-12 (CYANOCOBALAMIN ) tablet 100 mcg  100 mcg Per Tube Daily Amin, Sumayya, MD   100 mcg at 10/07/23 1005  .  Medications Prior to Admission  Medication Sig Dispense Refill   acetaminophen  (TYLENOL  CHILDRENS) 160 MG/5ML suspension Take 15.6 mLs (500 mg total) by mouth every 6 (six) hours as needed. 236 mL 0   albuterol  (ACCUNEB ) 1.25 MG/3ML nebulizer solution Take 3 mLs (1.25 mg total) by nebulization every 6 (six) hours as needed for wheezing. 75 mL 12   alum & mag hydroxide-simeth (MAALOX/MYLANTA) 200-200-20 MG/5ML suspension Take 15 mLs by mouth every 6 (six) hours as needed for indigestion or heartburn.     feeding supplement, ENSURE ENLIVE, (ENSURE ENLIVE) LIQD Take 237 mLs by mouth 2 (two) times daily between meals. 237 mL 12   Fluticasone -Salmeterol (ADVAIR) 250-50 MCG/DOSE AEPB  Inhale 1 puff into the lungs 2 (two) times daily.     lidocaine  (XYLOCAINE ) 2 % solution Use as directed 15 mLs in the mouth or throat as needed for mouth pain. 100 mL 0   LORazepam  (ATIVAN ) 0.5 MG tablet Take 0.5 tablets (0.25 mg total) by mouth every 8 (eight) hours as needed for anxiety. 30 tablet 0   Morphine  Sulfate (MORPHINE  CONCENTRATE) 10 mg / 0.5 ml concentrated solution Take 0.25 mLs (5 mg total) by mouth every 6 (six) hours as needed. 30 mL 0   ondansetron  (ZOFRAN -ODT) 4 MG disintegrating tablet Take 1 tablet (4 mg total) by mouth every 8 (eight) hours as needed for nausea or vomiting. 30 tablet 1   Phenylephrine -DM-GG-APAP 5-10-200-325 MG/15ML LIQD Take 15 mLs by mouth daily as needed (allergies).     tiotropium (SPIRIVA ) 18 MCG inhalation capsule Place 18 mcg into inhaler and inhale daily.     triamcinolone ointment (KENALOG) 0.1 % Apply 1 Application topically daily as needed (irritation).     abacavir -dolutegravir -lamiVUDine  (TRIUMEQ) 600-50-300 MG tablet Take 1 tablet by mouth daily. (Patient not taking: Reported on 10/01/2023)     bictegravir-emtricitabine -tenofovir  AF (BIKTARVY ) 50-200-25 MG TABS tablet Take 1 tablet by mouth daily. (Patient not taking: Reported on 10/01/2023)     ezetimibe (ZETIA) 10 MG tablet Take 10 mg by mouth daily.     famotidine  (PEPCID ) 20 MG tablet Take 1 tablet (20 mg total) by mouth daily. 30 tablet 0   naloxone  (NARCAN ) nasal spray 4 mg/0.1 mL SPRAY 1 SPRAY INTO ONE NOSTRIL AS DIRECTED FOR OPIOID OVERDOSE (TURN PERSON ON SIDE AFTER DOSE. IF NO RESPONSE IN 2-3 MINUTES OR PERSON RESPONDS BUT RELAPSES, REPEAT USING A NEW SPRAY DEVICE AND SPRAY INTO THE OTHER NOSTRIL. CALL 911 AFTER USE.) * EMERGENCY USE ONLY * (Patient not taking: No sig reported) 1 each 0   pantoprazole  (PROTONIX ) 40 MG tablet Take 1 tablet (40 mg total) by mouth 2 (two) times daily. (Patient not taking: No sig reported) 90 tablet 1    OBJECTIVE:  PHYSICAL EXAM  Vitals: Blood pressure  123/88, pulse 70, temperature (!) 97.4 F (36.3 C), temperature source Oral, resp. rate 19, height 5' 4 (1.626 m), weight 38.7 kg, last menstrual period 08/29/2014, SpO2 98%.. Orientation: cooperative but confused, pulling at sheets.  Neck: trach in good position, no bleeding. Cuff is down. Sutures in place.   MEDICAL DECISION MAKING: Data Review:  Results for orders placed or performed during the hospital encounter of 10/02/23 (from the past 48 hours)  Glucose, capillary  Status: Abnormal   Collection Time: 10/05/23  4:04 PM  Result Value Ref Range   Glucose-Capillary 175 (H) 70 - 99 mg/dL    Comment: Glucose reference range applies only to samples taken after fasting for at least 8 hours.  Glucose, capillary     Status: Abnormal   Collection Time: 10/05/23  9:16 PM  Result Value Ref Range   Glucose-Capillary 207 (H) 70 - 99 mg/dL    Comment: Glucose reference range applies only to samples taken after fasting for at least 8 hours.  Glucose, capillary     Status: Abnormal   Collection Time: 10/06/23 12:30 AM  Result Value Ref Range   Glucose-Capillary 210 (H) 70 - 99 mg/dL    Comment: Glucose reference range applies only to samples taken after fasting for at least 8 hours.  Magnesium      Status: Abnormal   Collection Time: 10/06/23  5:08 AM  Result Value Ref Range   Magnesium  2.6 (H) 1.7 - 2.4 mg/dL    Comment: Performed at [facility], [address] [place], [place] [address]  Renal function panel     Status: Abnormal   Collection Time: 10/06/23  5:08 AM  Result Value Ref Range   Sodium 144 135 - 145 mmol/L    Comment: ELECTROLYTES REPEATED TO VERIFY AB   Potassium 4.6 3.5 - 5.1 mmol/L   Chloride 106 98 - 111 mmol/L   CO2 38 (H) 22 - 32 mmol/L   Glucose, Bld 72 70 - 99 mg/dL    Comment: Glucose reference range applies only to samples taken after fasting for at least 8 hours.   BUN 38 (H) 6 - 20 mg/dL   Creatinine, Ser 9.42 0.44 - 1.00 mg/dL   Calcium 9.3 8.9  - 89.6 mg/dL   Phosphorus 1.5 (L) 2.5 - 4.6 mg/dL   Albumin 2.0 (L) 3.5 - 5.0 g/dL   GFR, Estimated >39 >39 mL/min    Comment: (NOTE) Calculated using the CKD-EPI Creatinine Equation (2021)    Anion gap 0 (L) 5 - 15    Comment: Performed at [facility], [address] [place], [place] [address]  Glucose, capillary     Status: None   Collection Time: 10/06/23  5:51 AM  Result Value Ref Range   Glucose-Capillary 74 70 - 99 mg/dL    Comment: Glucose reference range applies only to samples taken after fasting for at least 8 hours.  Glucose, capillary     Status: None   Collection Time: 10/06/23  8:28 AM  Result Value Ref Range   Glucose-Capillary 88 70 - 99 mg/dL    Comment: Glucose reference range applies only to samples taken after fasting for at least 8 hours.  Glucose, capillary     Status: Abnormal   Collection Time: 10/06/23 12:12 PM  Result Value Ref Range   Glucose-Capillary 142 (H) 70 - 99 mg/dL    Comment: Glucose reference range applies only to samples taken after fasting for at least 8 hours.  Glucose, capillary     Status: Abnormal   Collection Time: 10/06/23  8:30 PM  Result Value Ref Range   Glucose-Capillary 148 (H) 70 - 99 mg/dL    Comment: Glucose reference range applies only to samples taken after fasting for at least 8 hours.  Glucose, capillary     Status: Abnormal   Collection Time: 10/07/23 12:51 AM  Result Value Ref Range   Glucose-Capillary 156 (H) 70 - 99 mg/dL  Comment: Glucose reference range applies only to samples taken after fasting for at least 8 hours.  Glucose, capillary     Status: None   Collection Time: 10/07/23  4:14 AM  Result Value Ref Range   Glucose-Capillary 82 70 - 99 mg/dL    Comment: Glucose reference range applies only to samples taken after fasting for at least 8 hours.  Renal function panel     Status: Abnormal   Collection Time: 10/07/23  4:26 AM  Result Value Ref Range   Sodium 147 (H) 135 - 145 mmol/L   Potassium  4.1 3.5 - 5.1 mmol/L   Chloride 106 98 - 111 mmol/L   CO2 31 22 - 32 mmol/L   Glucose, Bld 86 70 - 99 mg/dL    Comment: Glucose reference range applies only to samples taken after fasting for at least 8 hours.   BUN 31 (H) 6 - 20 mg/dL   Creatinine, Ser 9.50 0.44 - 1.00 mg/dL   Calcium 8.8 (L) 8.9 - 10.3 mg/dL   Phosphorus 2.0 (L) 2.5 - 4.6 mg/dL   Albumin 2.0 (L) 3.5 - 5.0 g/dL   GFR, Estimated >39 >39 mL/min    Comment: (NOTE) Calculated using the CKD-EPI Creatinine Equation (2021)    Anion gap 10 5 - 15    Comment: Performed at [facility], [address] [place], [place] [address]  Magnesium      Status: Abnormal   Collection Time: 10/07/23  4:26 AM  Result Value Ref Range   Magnesium  2.7 (H) 1.7 - 2.4 mg/dL    Comment: Performed at [facility], [address] [place], [place] [address]  Glucose, capillary     Status: None   Collection Time: 10/07/23  7:46 AM  Result Value Ref Range   Glucose-Capillary 70 70 - 99 mg/dL    Comment: Glucose reference range applies only to samples taken after fasting for at least 8 hours.  . No results found..   ASSESSMENT: s/p trach for advanced laryngeal cancer  PLAN: Cuff down. Respiratory should monitor for how she tolerates this in terms of any increased suction needs, aspiration. If tolerating can put in a cuffless trach later this week after sutures out (Wednesday would be earliest, to allow adequate healing of tract). OK to try [facility] valve. Defer to medicine regarding concerns of confusion.    Deward GORMAN Dolly, MD 10/07/2023 11:56 AM

## 2023-10-07 NOTE — TOC Progression Note (Addendum)
 Transition of Care [facility]) - Progression Note    Patient Details  Name: Whitney [name] MRN: [id number] Date of Birth: [birth year]  Transition of [facility]) CM/SW Contact  Lauraine JAYSON Carpen, LCSW Phone Number: 10/07/2023, 11:12 AM  Clinical Narrative:   Patient asleep and did not wake up to CSW calling her name. Left son a Engineer, technical sales. CSW called and spoke to daughter. She confirmed wheelchair was delivered to the home but no RW. She is agreeable to 3-in-1 as well. CSW asked MD to enter orders and notified the [employer] liaison. Patient will need trach supplies as well. Daughter is agreeable to home health. CSW started search. [facility] is reviewing. [facility], [facility], [facility], [facility], and [facility] are unable to accept. Left messages for [facility] and [facility]. Will hold off on ordering tube feeds until it's determined if home health can be obtained. If not, will order tube feeds through [facility] and see if they can use one of their nurses.  11:48 am: [facility], [facility], and [facility] declined referral. Left voicemail for [facility] liaison to see if they can provide tube feeds and nursing. Will also talk to patient and daughter about potential for [facility]. They do take her insurance but it would take around 14 days to start services from the date the paperwork is submitted if she qualifies.  12:51 pm: [facility] is unable to provide nursing services but took referral for tube feeds. Left daughter a voicemail to update and see if she wanted CSW to submit referral for [facility].  1:21 pm: Met with daughter at bedside. She is agreeable to referral to [facility]. Sent referral to liaison in a secure email. Discussed how patient would get home at discharge. Patient will discharge to son's home and he lives on the third floor. May need ambulance transport. Would need to get Medicaid to authorize prior to the ride.  4:18 pm: Trach supply order form sent to pulmonologist to  fill out.    Barriers to Discharge: Continued Medical Work up               Expected Discharge Plan and Services       Living arrangements for the past 2 months: Apartment                                       Social Drivers of Health (SDOH) Interventions SDOH Screenings   Food Insecurity: No Food Insecurity (10/02/2023)  Housing: Low Risk  (10/02/2023)  Transportation Needs: No Transportation Needs (10/02/2023)  Recent Concern: Transportation Needs - Unmet Transportation Needs (09/06/2023)  Utilities: Not At Risk (10/02/2023)  Depression (PHQ2-9): Medium Risk (09/06/2023)  Financial Resource Strain: Medium Risk (09/09/2023)  Tobacco Use: High Risk (10/02/2023)    Readmission Risk Interventions     No data to display

## 2023-10-07 NOTE — Evaluation (Signed)
 Passy-Muir Speaking Valve - Evaluation Patient Details  Name: Whitney [name] MRN: [id number] Date of Birth: [birth year]  Today's Date: 10/07/2023 Time: 1240-1320 SLP Time Calculation (min) (ACUTE ONLY): 40 min  Past Medical History:  Past Medical History:  Diagnosis Date   Anemia    Anxiety    COPD (chronic obstructive pulmonary disease) (HCC)    Depression    GERD (gastroesophageal reflux disease)    History of methicillin resistant staphylococcus aureus (MRSA) 2010   HIV disease (HCC)    dx in 2010   Hyperlipidemia    Hypertension    Lung mass    Malnutrition (HCC)    Marijuana use    Neoplasm of larynx    Pneumonia    Psoriasis    PTSD (post-traumatic stress disorder)    Stroke (HCC)    Tobacco dependence    Vapes nicotine  containing substance    Past Surgical History:  Past Surgical History:  Procedure Laterality Date   CESAREAN SECTION     CREATION, GASTROSTOMY, OPEN N/A 10/02/2023   Procedure: CREATION, GASTROSTOMY, OPEN;  Surgeon: Jordis Laneta FALCON, MD;  Location: [facility] ORS;  Service: General;  Laterality: N/A;  G-tube in place   ESOPHAGOGASTRODUODENOSCOPY (EGD) WITH PROPOFOL  N/A 12/13/2022   Procedure: ESOPHAGOGASTRODUODENOSCOPY (EGD) WITH PROPOFOL ;  Surgeon: Therisa Bi, MD;  Location: [facility] ENDOSCOPY;  Service: Gastroenterology;  Laterality: N/A;   PORTACATH PLACEMENT Left 10/02/2023   Procedure: INSERTION, TUNNELED CENTRAL VENOUS DEVICE, WITH PORT;  Surgeon: Jordis Laneta FALCON, MD;  Location: [facility] ORS;  Service: General;  Laterality: Left;   TRACHEOSTOMY TUBE PLACEMENT N/A 10/02/2023   Procedure: CREATION, TRACHEOSTOMY;  Surgeon: Blair Mt, MD;  Location: [facility] ORS;  Service: ENT;  Laterality: N/A;  AWAKE TRACHEOSTOMY   TUBAL LIGATION     HPI:  Per MD Progress Note, 53yo with h/o HIV, COPD, hypertension, hyperlipidemia, malnutrition, PTSD, stroke, anxiety, and depression who presented on 10/02/23 for elective Tracheostomy, G-tube, and Chest port placement due to  metastatic head and neck SCC. Post procedure she was transferred to Stepdown/ICU for close monitoring. Tracheostomy and bronchoscopy with BAL and biopsy were done on 10/03/2023.  Patient with very poor long-term prognosis.  Palliative care was also consulted. PET Scan:  Corresponding to the recent CT findings there is a tracer avid  tumor extending from the vallecula to the vocal cords and involving  the right and left supraglottic larynx and area epigastric folds.  This measures approximately 3.2 x 3.7 by 4.6 cm. MRI 8/16: No acute intracranial abnormality or intracranial metastatic disease.  2. Multifocal hyperintense T2-weighted signal within the cerebral white matter,  most commonly due to chronic small vessel disease.    Assessment / Plan / Recommendation  Clinical Impression  Pt seen for initial PMV assessment to determine sufficient upper airway patency for safe use of PMV. Upon therapist entrance to room, pt on 5L trach collar, 28% FiO2. Vitals WNL (O2 94-97). Pt physically restless in bed, alert. Digital occlusion trials completed- with redirection required when pt attempted to move therapist's hand. No voicing achieved despite attempt. Trial of cough completed without secretions reaching oral cavity. Finger removed to allow for continued cough/recovery. Suction completed at the level of the trach. Thick, tan secretions noted. Brief (less than one minute) exploratory placement of PMV trialed to determine baseline tolerance. Pt with immediate increase in WOB, waving hands, with dip in O2 saturations to mid-80s. PMV removed and vitals/pt stabilized. Daughter present at the end of session and education shared regarding results of  assessment.   Acutely, upper airway patency is impacted by secretions and cuffed trach. MD with plan to potentially transition to cuffless trach as able and time will aid secretions. However, prognosis for comfortable, safe, prolonged use of PMV is guarded based on presence of  tumor directly impeding upper airway patency (leading to initial need for trach). SLP will continue trials for PMV. MD and surgeon aware or results and recommendations.   PMV to be placed by SLP only.   SLP Visit Diagnosis: Aphonia (R49.1)    Recommendations for use/ supervision  Patient may use Passy-Muir Speech Valve: with SLP only PMSV Supervision: Full   SLP Assessment  Patient needs continued Speech Language Pathology Services   Assistance Recommended at Discharge Frequent or constant Supervision/Assistance  Functional Status Assessment Patient has had a recent decline in their functional status and/or demonstrates limited ability to make significant improvements in function in a reasonable and predictable amount of time  Frequency and Duration min 2x/week  2 weeks    PMSV Trial PMSV was placed for: less than one min Able to redirect subglottic air through upper airway: No Able to Attain Phonation: No Voice Quality: Aphonic Able to Expectorate Secretions: No Level of Secretion Expectoration with PMSV: Tracheal Breath Support for Phonation: Inadequate Intelligibility: Not tested Respirations During Trial: 19 SpO2 During Trial: (!) 87 % Pulse During Trial: 85 Behavior: Anxious   Tracheostomy Tube  Additional Tracheostomy Tube Assessment Fenestrated: No Trach Collar Period: cont trach collar Secretion Description: thick, tan Level of Secretion Expectoration: Tracheal       Cuff Deflation Trial Tolerated Cuff Deflation: Yes Length of Time for Cuff Deflation Trial: deflated t/o session- currently on trach collar at baseline Behavior: Alert;Anxious;Confused;Restless  [place] Zyaira Vejar Clapp, MS, CCC-SLP Speech Language Pathologist Rehab Services; [facility] [phone] (ascom)         [place] J Clapp 10/07/2023, 1:30 PM

## 2023-10-07 NOTE — Progress Notes (Signed)
 Two nurses attempted straight cath and were not successful. Informed attending jennifer yates.

## 2023-10-07 NOTE — Progress Notes (Signed)
 ID HIV RNA 30- indicating she was taking biktarvy  Continue the same  ID will sign off

## 2023-10-07 NOTE — TOC CM/SW Note (Signed)
 Patient is not able to walk the distance required to go the bathroom, or he/she is unable to safely negotiate stairs required to access the bathroom.  A 3in1 BSC will alleviate this problem

## 2023-10-07 NOTE — Progress Notes (Signed)
 Progress Note   Patient: Whitney [name] FMW:[id number] DOB: 15-Feb-1971 DOA: 10/02/2023     5 DOS: the patient was seen and examined on 10/07/2023   Brief hospital course: 53yo with h/o HIV, COPD, hypertension, hyperlipidemia, malnutrition, PTSD, stroke, anxiety, and depression who presented on 10/02/23 for elective Tracheostomy, G-tube, and Chest port placement due to metastatic head and neck SCC. Post procedure she was transferred to Stepdown/ICU for close monitoring. Tracheostomy and bronchoscopy with BAL and biopsy were done on 10/03/2023.  Patient with very poor long-term prognosis.  Palliative care was also consulted.  Patient and family are considering home with hospice vs. HH services.  She has been very insistent about discharging ASAP.  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, and there is also concern of a primary lung cancer with a lung mass which was biopsied  and is pending results MRI brain negative for any metastatic disease Oncology recommended hospice but patient wants to do everything possible, s/p trach and PEG tube placement as recommended by oncology before starting any treatment Trach collar in place with persistent O2 requirements; needs trach collar O2 in place prior to dc Patient has very poor prognosis Follow-up with outpatient oncology after lung lesion pathology results Palliative care from cancer center is on board Palliative care in hospital was also consulted, appears to be moving toward comfort care Patient and family are deciding about comfort vs. Home health and will need equipment in place prior to dc She is due for trach exchange and suture removal on Wednesday (8/20) so ideally would stay until then and dc after, if patient is willing Still undergoing Pauci Muir valve assessments, working with SLP;  continue as inpatient vs. outpatient   HIV disease Patient was not taking her HIV medication for some time due to worsening dysphagia secondary to laryngeal cancer CD4 count and BAL cultures are pending ID was consulted to restart HIV medications as it has to be optimized before concentration of any oncologic treatment Patient is high risk for opportunistic infections and immune reconstitution syndrome ID restarted Biktarvy    Protein-calorie malnutrition, severe Patient with severe cachexia, significant recent weight loss which seems multifactorial with her untreated HIV and new diagnosis of metastatic malignancy She was having significant dysphagia and unable to eat S/p PEG tube placement and she was started on tube feeds High risk for refeeding syndrome Continue with tube feeds as recommended by dietitian Needs TF supplies at home and education prior to dc   Anemia Anemia panel consistent with anemia of chronic disease with iron  and folic acid  deficiency, B12 WNL Started on supplement   COPD (chronic obstructive pulmonary disease)  No current wheezing S/p tracheostomy for advanced laryngeal cancer Continue Advair, Spiriva , Albuterol    Hyperlipidemia Home Zetia has been held for a while due to worsening dysphagia, will continue to hold Reasonable lipid control Would not add additional medications for this issue, as she has other more life-threatening issues at this time   GOC Palliative care is consulting She is now DNR Ongoing discussion regarding comfort care is appropriate 8/17, she was demanding discharge and threatening to leave AMA if not discharged by 2pm She reported that she is planning for home hospice On subsequent discussions, she is uncertain about hospice vs. Palliative and is not ready to commit to either yet - which is delaying her ability to go home even though she  clearly desires this Will need outpatient palliative care vs. Hospice Palliative care and  hospice teams are following She needs trach collar O2 at the time of dc and so discharge will be held until then, likely 8/18 Home PT/OT recommended, but will defer for now pending possible plan for home hospice           Consultants: PCCM Surgery  ENT ID Oncology (telephone only) Palliative care PT OT [facility] team   Procedures: Tracheostomy 8/13 L internal jugular 8/13 Gastrostomy tube 8/13   Antibiotics: Cefotetan  x 3 doses  30 Day Unplanned Readmission Risk Score    Flowsheet Row Admission (Current) from 10/02/2023 in [facility]  30 Day Unplanned Readmission Risk Score (%) 16.68 Filed at 10/07/2023 1200    This score is the patient's risk of an unplanned readmission within 30 days of being discharged (0 -100%). The score is based on dignosis, age, lab data, medications, orders, and past utilization.   Low:  0-14.9   Medium: 15-21.9   High: 22-29.9   Extreme: 30 and above           Subjective: Still insistent about dc to home - but less so than yesterday. Reports feeling well.   Objective: Vitals:   10/07/23 1604 10/07/23 1607  BP:    Pulse:  71  Resp:    Temp:    SpO2: 93%     Intake/Output Summary (Last 24 hours) at 10/07/2023 1609 Last data filed at 10/07/2023 1440 Gross per 24 hour  Intake 400 ml  Output --  Net 400 ml   Filed Weights   10/05/23 0500 10/06/23 0600 10/07/23 0419  Weight: 31.2 kg 36.6 kg 38.7 kg    Exam:  General:  Appears calm and comfortable and is in NAD; frail, chronically ill-appearing, cachectic Eyes:  normal lids, iris ENT:  grossly normal hearing, lips & tongue, mmm Neck:  trach in place with trach collar O2 Cardiovascular:  RRR. No LE edema.  Respiratory:   Scattered upper airway noise vs. Subtle rhonchi.  Normal respiratory effort. Abdomen:  soft, NT, ND Skin:  no rash or induration seen on limited exam Musculoskeletal:  grossly normal tone BUE/BLE, good ROM, no bony abnormality Psychiatric:   grossly normal mood and affect, unable to speak with trach but clearly mouths words appropriately and writes anything I could not understand Neurologic:  CN 2-12 grossly intact, moves all extremities in coordinated fashion  Data Reviewed: I have reviewed the patient's lab results since admission.  Pertinent labs for today include:   Phos 2.0 Albumin 2.0     Family Communication: None present; I went back later and spoke with her daughter at the bedside  Disposition: Status is: Inpatient Remains inpatient appropriate because: arranging outpatient services, ongoing GOC discussion     Time spent: 50 minutes  Unresulted Labs (From admission, onward)     Start     Ordered   10/04/23 1311  Fungitell Beta-D-Glucan  Once,   R       Question:  Specimen collection method  Answer:  Lab=Lab collect   10/04/23 1310   10/04/23 1302  GenoSure Prime  Once,   R       Question:  Specimen collection method  Answer:  Lab=Lab collect   10/04/23 1302   10/04/23 0500  Renal function panel  Daily,   R     Question:  Specimen collection method  Answer:  Lab=Lab collect   10/03/23 1211   10/03/23  1255  Acid Fast Culture with reflexed sensitivities  (AFB smear + Culture w reflexed sensitivities with precautions panel)  Once,   R       Placed in And Linked Group   10/03/23 1255             Author: Delon Herald, MD 10/07/2023 4:09 PM  For on call review [email or web address].

## 2023-10-08 ENCOUNTER — Other Ambulatory Visit: Payer: Self-pay

## 2023-10-08 DIAGNOSIS — C329 Malignant neoplasm of larynx, unspecified: Secondary | ICD-10-CM | POA: Diagnosis not present

## 2023-10-08 LAB — RENAL FUNCTION PANEL
Albumin: 2.3 g/dL — ABNORMAL LOW (ref 3.5–5.0)
Anion gap: 6 (ref 5–15)
BUN: 23 mg/dL — ABNORMAL HIGH (ref 6–20)
CO2: 35 mmol/L — ABNORMAL HIGH (ref 22–32)
Calcium: 9 mg/dL (ref 8.9–10.3)
Chloride: 103 mmol/L (ref 98–111)
Creatinine, Ser: 0.53 mg/dL (ref 0.44–1.00)
GFR, Estimated: >60 mL/min (ref >60)
Glucose, Bld: 79 mg/dL (ref 70–99)
Phosphorus: 2.4 mg/dL — ABNORMAL LOW (ref 2.5–4.6)
Potassium: 4.4 mmol/L (ref 3.5–5.1)
Sodium: 144 mmol/L (ref 135–145)

## 2023-10-08 LAB — GLUCOSE, CAPILLARY
Glucose-Capillary: 64 mg/dL — ABNORMAL LOW (ref 70–99)
Glucose-Capillary: 142 mg/dL — ABNORMAL HIGH (ref 70–99)
Glucose-Capillary: 180 mg/dL — ABNORMAL HIGH (ref 70–99)
Glucose-Capillary: 87 mg/dL (ref 70–99)
Glucose-Capillary: 93 mg/dL (ref 70–99)
Glucose-Capillary: 101 mg/dL — ABNORMAL HIGH (ref 70–99)

## 2023-10-08 LAB — FUNGITELL BETA-D-GLUCAN: Fungitell Value:: 40.35 pg/mL

## 2023-10-08 MED ORDER — NUTREN 1.5 EN LIQD: 250.0000 mL | Freq: Four times a day (QID) | ENTERAL | 0 refills | Status: DC

## 2023-10-08 MED ORDER — FLUCONAZOLE 100 MG PO TABS
100.0000 mg | ORAL_TABLET | Freq: Every day | ORAL | 0 refills | Status: AC
  Filled 2023-10-08: qty 3, 3d supply, fill #0

## 2023-10-08 MED ORDER — MORPHINE SULFATE (CONCENTRATE) 10 MG /0.5 ML PO SOLN
5.0000 mg | Freq: Four times a day (QID) | ORAL | 0 refills | Status: AC | PRN
  Filled 2023-10-08: qty 30, 30d supply, fill #0

## 2023-10-08 MED ORDER — MEDIHONEY WOUND/BURN DRESSING EX PSTE
1.0000 | PASTE | Freq: Every day | CUTANEOUS | 0 refills | Status: DC
  Filled 2023-10-08: qty 30, 30d supply, fill #0

## 2023-10-08 MED ORDER — FOLIC ACID 1 MG PO TABS: 1.0000 mg | ORAL_TABLET | Freq: Every day | ORAL | 0 refills | Status: DC

## 2023-10-08 MED ORDER — POLYSACCHARIDE IRON COMPLEX 150 MG PO CAPS: 150.0000 mg | ORAL_CAPSULE | Freq: Every day | ORAL | Status: DC

## 2023-10-08 MED ORDER — POLYETHYLENE GLYCOL 3350 17 G PO PACK: 17.0000 g | PACK | Freq: Every day | ORAL | 0 refills | Status: DC | PRN

## 2023-10-08 MED ORDER — ZINC OXIDE 40 % EX OINT
TOPICAL_OINTMENT | Freq: Two times a day (BID) | CUTANEOUS | 0 refills | Status: DC
  Filled 2023-10-08: qty 56.7, fill #0

## 2023-10-08 MED ORDER — FREE WATER: 100.0000 mL | Freq: Four times a day (QID) | 0 refills | Status: DC

## 2023-10-08 MED ORDER — ATOVAQUONE 750 MG/5ML PO SUSP
1500.0000 mg | Freq: Every day | ORAL | 0 refills | Status: DC
  Filled 2023-10-08: qty 210, 21d supply, fill #0

## 2023-10-08 MED ORDER — K PHOS MONO-SOD PHOS DI & MONO 155-852-130 MG PO TABS
500.0000 mg | ORAL_TABLET | ORAL | Status: AC
  Administered 2023-10-08 (×2): 500 mg
  Filled 2023-10-08 (×2): qty 2

## 2023-10-08 MED ORDER — DEXTROSE 50 % IV SOLN
12.5000 g | INTRAVENOUS | Status: AC
  Administered 2023-10-08: 12.5 g via INTRAVENOUS
  Filled 2023-10-08: qty 50

## 2023-10-08 MED ORDER — BICTEGRAVIR-EMTRICITAB-TENOFOV 50-200-25 MG PO TABS
1.0000 | ORAL_TABLET | Freq: Every day | ORAL | 0 refills | Status: DC
  Filled 2023-10-08: qty 30, 30d supply, fill #0

## 2023-10-08 MED ORDER — ADULT MULTIVITAMIN W/MINERALS CH: 1.0000 | ORAL_TABLET | Freq: Every day | ORAL | 0 refills | Status: DC

## 2023-10-08 MED ORDER — LORAZEPAM 0.5 MG PO TABS
0.2500 mg | ORAL_TABLET | Freq: Three times a day (TID) | ORAL | 0 refills | Status: AC | PRN
  Filled 2023-10-08 (×2): qty 2, 2d supply, fill #0

## 2023-10-08 NOTE — Progress Notes (Signed)
 Brief Nutrition Note  Case discussed with MD and hospice liaison. Per hospice liaison, plan to d/c home with hospice today, pending equipment arrival. Recommendations needs for Nutren 1.5 for formulary substitution.   Home TF regimen:  250 ml Nutren 1.5 via g-tube 4 times daily; 50 ml free water  flush before and after each administration  Regimen provides 1520 kcals, 68 grams protein, 764 ml water  (1164 ml water  with inclusion of flushes)   Margery ORN, RD, LDN, CDCES Registered Dietitian III Certified Diabetes Care and Education Specialist If unable to reach this RD, please use RD Inpatient group chat on secure chat between hours of 8am-4 pm daily

## 2023-10-08 NOTE — Progress Notes (Signed)
 Trach Care education was given to patients son and again to daughter. Trach tie change, suctioning, and inner cannula change went over. Also, Rn and RT both printed out trach education from epic to send home with pt and care givers.

## 2023-10-08 NOTE — Progress Notes (Signed)
 Provided education and demonstrated how to use g-tube for meds and feeds/flushes. Provided education to both son hughes) and daughter

## 2023-10-08 NOTE — TOC Progression Note (Signed)
 Transition of Care [facility]) - Progression Note    Patient Details  Name: Whitney [name] MRN: [id number] Date of Birth: [birth year]  [facility] CM/SW Contact  Lauraine JAYSON Carpen, LCSW Phone Number: 10/08/2023, 1:30 PM  Clinical Narrative:   Address on facesheet is correct. Will call [facility] to coordinate ambulance transport home once DME delivery time is confirmed. Daughter is aware.    Barriers to Discharge: Continued Medical Work up               Expected Discharge Plan and Services       Living arrangements for the past 2 months: Apartment Expected Discharge Date: 10/06/23                                     Social Drivers of Health (SDOH) Interventions SDOH Screenings   Food Insecurity: No Food Insecurity (10/02/2023)  Housing: Low Risk  (10/02/2023)  Transportation Needs: No Transportation Needs (10/02/2023)  Recent Concern: Transportation Needs - Unmet Transportation Needs (09/06/2023)  Utilities: Not At Risk (10/02/2023)  Depression (PHQ2-9): Medium Risk (09/06/2023)  Financial Resource Strain: Medium Risk (09/09/2023)  Tobacco Use: High Risk (10/02/2023)    Readmission Risk Interventions     No data to display

## 2023-10-08 NOTE — Progress Notes (Signed)
 EOS Intermittent confusion overnight At times, pt writing on paper nonsensical letters and 'words', other times lucid and demanding to be taken home today

## 2023-10-08 NOTE — Progress Notes (Signed)
 RT was asked to come in and help with tracheostomy care education. RT spoke with daughter and walked her through how to change inner cannula, trach ties, and how to suction patient. Extra inner cannulas, split gauze, and suction catheters placed in patient belongings to be sent home with patient. Pt trach education pertaining to suctioning, tie change, and trach care safety and care were printed from clinical key and placed in patient belongings bag as well. [name] son is coming in later this evening, and RT will go and help educate him as well when called.

## 2023-10-08 NOTE — Plan of Care (Signed)
  Problem: Clinical Measurements: Goal: Respiratory complications will improve Outcome: Progressing   Problem: Coping: Goal: Level of anxiety will decrease Outcome: Progressing   Problem: Pain Managment: Goal: General experience of comfort will improve and/or be controlled Outcome: Progressing

## 2023-10-08 NOTE — Plan of Care (Signed)
  Problem: Education: Goal: Knowledge of General Education information will improve Description: Including pain rating scale, medication(s)/side effects and non-pharmacologic comfort measures 10/08/2023 1813 by Arloa Dene KIDD, RN Outcome: Adequate for Discharge 10/08/2023 1122 by Arloa Dene KIDD, RN Outcome: Progressing   Problem: Health Behavior/Discharge Planning: Goal: Ability to manage health-related needs will improve 10/08/2023 1813 by Arloa Dene KIDD, RN Outcome: Adequate for Discharge 10/08/2023 1122 by Arloa Dene KIDD, RN Outcome: Progressing   Problem: Clinical Measurements: Goal: Ability to maintain clinical measurements within normal limits will improve 10/08/2023 1813 by Arloa Dene KIDD, RN Outcome: Adequate for Discharge 10/08/2023 1122 by Arloa Dene KIDD, RN Outcome: Progressing Goal: Will remain free from infection 10/08/2023 1813 by Arloa Dene KIDD, RN Outcome: Adequate for Discharge 10/08/2023 1122 by Arloa Dene KIDD, RN Outcome: Progressing Goal: Diagnostic test results will improve 10/08/2023 1813 by Arloa Dene KIDD, RN Outcome: Adequate for Discharge 10/08/2023 1122 by Arloa Dene KIDD, RN Outcome: Progressing Goal: Respiratory complications will improve 10/08/2023 1813 by Arloa Dene KIDD, RN Outcome: Adequate for Discharge 10/08/2023 1122 by Arloa Dene KIDD, RN Outcome: Progressing Goal: Cardiovascular complication will be avoided 10/08/2023 1813 by Arloa Dene KIDD, RN Outcome: Adequate for Discharge 10/08/2023 1122 by Arloa Dene KIDD, RN Outcome: Progressing   Problem: Activity: Goal: Risk for activity intolerance will decrease 10/08/2023 1813 by Arloa Dene KIDD, RN Outcome: Adequate for Discharge 10/08/2023 1122 by Arloa Dene KIDD, RN Outcome: Progressing   Problem: Nutrition: Goal: Adequate nutrition will be maintained 10/08/2023 1813 by Arloa Dene KIDD, RN Outcome: Adequate for Discharge 10/08/2023 1122 by  Arloa Dene KIDD, RN Outcome: Progressing   Problem: Coping: Goal: Level of anxiety will decrease 10/08/2023 1813 by Arloa Dene KIDD, RN Outcome: Adequate for Discharge 10/08/2023 1122 by Arloa Dene KIDD, RN Outcome: Progressing   Problem: Elimination: Goal: Will not experience complications related to bowel motility 10/08/2023 1813 by Arloa Dene KIDD, RN Outcome: Adequate for Discharge 10/08/2023 1122 by Arloa Dene KIDD, RN Outcome: Progressing Goal: Will not experience complications related to urinary retention 10/08/2023 1813 by Arloa Dene KIDD, RN Outcome: Adequate for Discharge 10/08/2023 1122 by Arloa Dene KIDD, RN Outcome: Progressing   Problem: Pain Managment: Goal: General experience of comfort will improve and/or be controlled 10/08/2023 1813 by Arloa Dene KIDD, RN Outcome: Adequate for Discharge 10/08/2023 1122 by Arloa Dene KIDD, RN Outcome: Progressing   Problem: Safety: Goal: Ability to remain free from injury will improve 10/08/2023 1813 by Arloa Dene KIDD, RN Outcome: Adequate for Discharge 10/08/2023 1122 by Arloa Dene KIDD, RN Outcome: Progressing   Problem: Skin Integrity: Goal: Risk for impaired skin integrity will decrease 10/08/2023 1813 by Arloa Dene KIDD, RN Outcome: Adequate for Discharge 10/08/2023 1122 by Arloa Dene KIDD, RN Outcome: Progressing

## 2023-10-08 NOTE — Progress Notes (Signed)
 [facility] Room 258-A - [facility] Liaison Note Received request from Lauraine Carpen, LCSW, Transitions of Care Manager, for hospice services at home after discharge.  Spoke with patient and her son, [name] (via phone) to initiate education related to hospice philosophy, services, and team approach care. Patient/family verbalized understanding of information given.  Per discussion, the plan is for discharge home by [facility] covered non-emergent EMS on 8.19.25. DME needs discussed.  Patient has the following DME in home: none  Patient/family has requested the following DME for delivery: bedside commode, standard walker (delivered to patient room by [employer]) additional DME ordered to be delivered to patients home; oxygen concentrator with trach collar, portable suction. The address has been verified in chart and is correct. [name] (son) [phone] or [place] (daughter) [phone] is the family contact to arrange time of equipment delivery. Additional supplies that have been ordered for home delivery include; tube feeding per order and trach supplies. Please send signed and completed DNR home with patient/family.  Please provide prescriptions at discharge as needed to ensure ongoing symptom management.   [facility] information and contact numbers given to [place] and [place]. Above information share with Lauraine Carpen, LCSW, Transitions of care management.  Thank you for the opportunity to participate in this patient's care.  Daphne Shed, LPN [facility] Liaison [phone]

## 2023-10-08 NOTE — Progress Notes (Signed)
 PHARMACY CONSULT NOTE - FOLLOW UP  Pharmacy Consult for Electrolyte Monitoring and Replacement   Recent Labs: Potassium (mmol/L)  Date Value  10/08/2023 4.4   Magnesium  (mg/dL)  Date Value  91/81/7974 2.7 (H)   Calcium (mg/dL)  Date Value  91/80/7974 9.0   Albumin (g/dL)  Date Value  91/80/7974 2.3 (L)   Phosphorus (mg/dL)  Date Value  91/80/7974 2.4 (L)   Sodium (mmol/L)  Date Value  10/08/2023 144     Assessment: 53 y/o female with h/o COPD, HIV, depression, anxiety, GERD, gonorrhea, neuropathy, stroke, HTN, marijuana use, IDA, esophageal stenosis s/p dilation 11/2022 and newly diagnosed SCC with noted pharyngeal and lung masses with resulting dysphagia who is s/p tracheostomy, port and surgical G-tube placement 8/13. Pharmacy is asked to follow and replace electrolytes  Goal of Therapy:  Electrolytes WNL  Plan:  Kphos 2 tabs x 2.  F/u with AM labs.   Cathaleen GORMAN Blanch ,PharmD Clinical Pharmacist 10/08/2023 7:47 AM

## 2023-10-08 NOTE — TOC Transition Note (Addendum)
 Transition of Care [facility]) - Discharge Note   Patient Details  Name: Whitney [name] MRN: [id number] Date of Birth: May 23, 1970  Transition of Care [facility]) CM/SW Contact:  Lauraine JAYSON Carpen, LCSW Phone Number: 10/08/2023, 4:38 PM   Clinical Narrative: Patient has orders to discharge home today. [facility] liaison is aware. DME delivery time is pending. CSW called [employer] with [facility] Medicaid to arrange ambulance transport home. Reference # [id number]. CSW spoke to [place]. Locating a company to transport can take up to 3 hours. CSW notified them that she should be ready around 6:00 to give DME enough time to deliver. No further concerns. CSW signing off.    4:58 PM: Per [facility] liaison, DME has been delivered.  Final next level of care: Home w Hospice Care Barriers to Discharge: Barriers Resolved   Patient Goals and CMS Choice            Discharge Placement                Patient to be transferred to facility by: Ambulance - Medicaid is arranging Name of family member notified: Joey and [place] Patient and family notified of of transfer: 10/08/23  Discharge Plan and Services Additional resources added to the After Visit Summary for                                       Social Drivers of Health (SDOH) Interventions SDOH Screenings   Food Insecurity: No Food Insecurity (10/02/2023)  Housing: Low Risk  (10/02/2023)  Transportation Needs: No Transportation Needs (10/02/2023)  Recent Concern: Transportation Needs - Unmet Transportation Needs (09/06/2023)  Utilities: Not At Risk (10/02/2023)  Depression (PHQ2-9): Medium Risk (09/06/2023)  Financial Resource Strain: Medium Risk (09/09/2023)  Tobacco Use: High Risk (10/02/2023)     Readmission Risk Interventions     No data to display

## 2023-10-08 NOTE — TOC CM/SW Note (Signed)
..  Transition of Care [facility]) - Inpatient Brief Assessment   Patient Details  Name: Whitney [name] MRN: [id number] Date of Birth: 11-Oct-1970  Transition of Care [facility]) CM/SW Contact:    Edsel DELENA Fischer, LCSW Phone Number: 10/08/2023, 5:12 PM   Clinical Narrative:  SW contacted [employer] with [facility] Medicaid at [phone] and provided pt  Reference # [id number].  Rep stated that they are still calling around to locate a company to assist with transportation.  Rep stated that it is not a guarantee that they will be able to transport today due to high call requests.  SW notified staff.    Transition of Care Asessment:

## 2023-10-08 NOTE — Plan of Care (Signed)

## 2023-10-08 NOTE — Progress Notes (Signed)
 Patient ID: Whitney [name], female   DOB: Jan 15, 1971, 53 y.o.   MRN: [id number] Whitney [name], Whitney [name] [id number] 11/05/1970 Whitney GORMAN Dolly, MD   SUBJECTIVE: This 53 y.o. year old female is status post PR TRACHEOSTOMY PLANNED SEPARATE PROCEDURE [31600] PR EGD PERCUTANEOUS PLACEMENT GASTROSTOMY TUBE [43246] PR INSJ TUNNELED CTR VAD W/SUBQ PORT AGE 53 YR/> [36561].  Patient is status post tracheostomy last Wednesday for airway protection due to advanced laryngeal cancer.  It originally oncology had planned to treat with radiation but now the patient seems to have elected for hospice care.  Medications:  Current Facility-Administered Medications  Medication Dose Route Frequency Provider Last Rate Last Admin   atovaquone  (MEPRON ) 750 MG/5ML suspension 1,500 mg  1,500 mg Per Tube Q breakfast Ravishankar, Donald, MD   1,500 mg at 10/08/23 1004   bictegravir-emtricitabine -tenofovir  AF (BIKTARVY ) 50-200-25 MG per tablet 1 tablet  1 tablet Oral Daily Ravishankar, Donald, MD   1 tablet at 10/08/23 1002   Chlorhexidine  Gluconate Cloth 2 % PADS 6 each  6 each Topical Daily Aleskerov, Fuad, MD   6 each at 10/07/23 1100   docusate (COLACE) 50 MG/5ML liquid 100 mg  100 mg Per Tube BID PRN Keene, Jeremiah D, NP       feeding supplement (OSMOLITE 1.5 CAL) liquid 237 mL  237 mL Per Tube QID Parris Manna, MD   237 mL at 10/08/23 0950   fluconazole  (DIFLUCAN ) tablet 100 mg  100 mg Per Tube Daily Barbarann Nest, MD   100 mg at 10/08/23 1003   fluticasone  furoate-vilanterol (BREO ELLIPTA ) 200-25 MCG/ACT 1 puff  1 puff Inhalation Daily Keene, Jeremiah D, NP   1 puff at 10/08/23 1001   folic acid  (FOLVITE ) tablet 1 mg  1 mg Per Tube Daily Amin, Sumayya, MD   1 mg at 10/08/23 1001   free water  100 mL  100 mL Per Tube QID Parris Manna, MD   100 mL at 10/08/23 0951   ipratropium-albuterol  (DUONEB) 0.5-2.5 (3) MG/3ML nebulizer solution 3 mL  3 mL Nebulization Q4H PRN Keene, Jeremiah D, NP       iron  polysaccharides  (NIFEREX) capsule 150 mg  150 mg Per Tube Daily Amin, Sumayya, MD   150 mg at 10/08/23 1003   leptospermum manuka honey (MEDIHONEY) paste 1 Application  1 Application Topical Daily Barbarann Nest, MD       lip balm (CARMEX) ointment 1 Application  1 Application Topical PRN Rust-Chester, Jenita CROME, NP       liver oil-zinc  oxide (DESITIN) 40 % ointment   Topical BID Barbarann Nest, MD   Given at 10/07/23 2310   morphine  CONCENTRATE 10 mg / 0.5 ml oral solution 5 mg  5 mg Per Tube Q6H PRN Lenon Elsie HERO, RPH   5 mg at 10/08/23 1002   multivitamin with minerals tablet 1 tablet  1 tablet Per Tube Daily Amin, Sumayya, MD   1 tablet at 10/08/23 1001   ondansetron  (ZOFRAN ) injection 4 mg  4 mg Intravenous Q6H PRN Keene, Jeremiah D, NP       pantoprazole  (PROTONIX ) injection 40 mg  40 mg Intravenous Q24H Keene, Jeremiah D, NP   40 mg at 10/07/23 1802   phosphorus (K PHOS  NEUTRAL) tablet 500 mg  500 mg Per Tube Q4H Patel, Kishan S, RPH   500 mg at 10/08/23 1004   polyethylene glycol (MIRALAX  / GLYCOLAX ) packet 17 g  17 g Per Tube Daily PRN Keene, Jeremiah D, NP  thiamine  (VITAMIN B1) tablet 100 mg  100 mg Per Tube Daily Aleskerov, Fuad, MD   100 mg at 10/08/23 1002   umeclidinium bromide  (INCRUSE ELLIPTA ) 62.5 MCG/ACT 1 puff  1 puff Inhalation Daily Keene, Jeremiah D, NP   1 puff at 10/08/23 1001   vitamin B-12 (CYANOCOBALAMIN ) tablet 100 mcg  100 mcg Per Tube Daily Amin, Sumayya, MD   100 mcg at 10/08/23 1004  .  Medications Prior to Admission  Medication Sig Dispense Refill   acetaminophen  (TYLENOL  CHILDRENS) 160 MG/5ML suspension Take 15.6 mLs (500 mg total) by mouth every 6 (six) hours as needed. 236 mL 0   albuterol  (ACCUNEB ) 1.25 MG/3ML nebulizer solution Take 3 mLs (1.25 mg total) by nebulization every 6 (six) hours as needed for wheezing. 75 mL 12   alum & mag hydroxide-simeth (MAALOX/MYLANTA) 200-200-20 MG/5ML suspension Take 15 mLs by mouth every 6 (six) hours as needed for indigestion  or heartburn.     feeding supplement, ENSURE ENLIVE, (ENSURE ENLIVE) LIQD Take 237 mLs by mouth 2 (two) times daily between meals. 237 mL 12   Fluticasone -Salmeterol (ADVAIR) 250-50 MCG/DOSE AEPB Inhale 1 puff into the lungs 2 (two) times daily.     lidocaine  (XYLOCAINE ) 2 % solution Use as directed 15 mLs in the mouth or throat as needed for mouth pain. 100 mL 0   LORazepam  (ATIVAN ) 0.5 MG tablet Take 0.5 tablets (0.25 mg total) by mouth every 8 (eight) hours as needed for anxiety. 30 tablet 0   Morphine  Sulfate (MORPHINE  CONCENTRATE) 10 mg / 0.5 ml concentrated solution Take 0.25 mLs (5 mg total) by mouth every 6 (six) hours as needed. 30 mL 0   ondansetron  (ZOFRAN -ODT) 4 MG disintegrating tablet Take 1 tablet (4 mg total) by mouth every 8 (eight) hours as needed for nausea or vomiting. 30 tablet 1   Phenylephrine -DM-GG-APAP 5-10-200-325 MG/15ML LIQD Take 15 mLs by mouth daily as needed (allergies).     tiotropium (SPIRIVA ) 18 MCG inhalation capsule Place 18 mcg into inhaler and inhale daily.     triamcinolone ointment (KENALOG) 0.1 % Apply 1 Application topically daily as needed (irritation).     abacavir -dolutegravir -lamiVUDine  (TRIUMEQ) 600-50-300 MG tablet Take 1 tablet by mouth daily. (Patient not taking: Reported on 10/01/2023)     bictegravir-emtricitabine -tenofovir  AF (BIKTARVY ) 50-200-25 MG TABS tablet Take 1 tablet by mouth daily. (Patient not taking: Reported on 10/01/2023)     ezetimibe (ZETIA) 10 MG tablet Take 10 mg by mouth daily.     famotidine  (PEPCID ) 20 MG tablet Take 1 tablet (20 mg total) by mouth daily. 30 tablet 0   naloxone  (NARCAN ) nasal spray 4 mg/0.1 mL SPRAY 1 SPRAY INTO ONE NOSTRIL AS DIRECTED FOR OPIOID OVERDOSE (TURN PERSON ON SIDE AFTER DOSE. IF NO RESPONSE IN 2-3 MINUTES OR PERSON RESPONDS BUT RELAPSES, REPEAT USING A NEW SPRAY DEVICE AND SPRAY INTO THE OTHER NOSTRIL. CALL 911 AFTER USE.) * EMERGENCY USE ONLY * (Patient not taking: No sig reported) 1 each 0    pantoprazole  (PROTONIX ) 40 MG tablet Take 1 tablet (40 mg total) by mouth 2 (two) times daily. (Patient not taking: No sig reported) 90 tablet 1    OBJECTIVE:  PHYSICAL EXAM  Vitals: Blood pressure (!) 133/96, pulse 66, temperature 98.3 F (36.8 C), temperature source Oral, resp. rate 20, height 5' 4 (1.626 m), weight 38.2 kg, last menstrual period 08/29/2014, SpO2 97%..  Neck: #6 cuffed tracheostomy tube successfully changed with a #6 Shiley uncuffed tracheostomy tube placed.  Neck  wound shows very slow healing of the stoma site and yellow secretions suctioned from the airway.  No bleeding.  Patient tolerated trach change without issues.  MEDICAL DECISION MAKING: Data Review:  Results for orders placed or performed during the hospital encounter of 10/02/23 (from the past 48 hours)  Glucose, capillary     Status: Abnormal   Collection Time: 10/06/23  8:30 PM  Result Value Ref Range   Glucose-Capillary 148 (H) 70 - 99 mg/dL    Comment: Glucose reference range applies only to samples taken after fasting for at least 8 hours.  Glucose, capillary     Status: Abnormal   Collection Time: 10/07/23 12:51 AM  Result Value Ref Range   Glucose-Capillary 156 (H) 70 - 99 mg/dL    Comment: Glucose reference range applies only to samples taken after fasting for at least 8 hours.  Glucose, capillary     Status: None   Collection Time: 10/07/23  4:14 AM  Result Value Ref Range   Glucose-Capillary 82 70 - 99 mg/dL    Comment: Glucose reference range applies only to samples taken after fasting for at least 8 hours.  Renal function panel     Status: Abnormal   Collection Time: 10/07/23  4:26 AM  Result Value Ref Range   Sodium 147 (H) 135 - 145 mmol/L   Potassium 4.1 3.5 - 5.1 mmol/L   Chloride 106 98 - 111 mmol/L   CO2 31 22 - 32 mmol/L   Glucose, Bld 86 70 - 99 mg/dL    Comment: Glucose reference range applies only to samples taken after fasting for at least 8 hours.   BUN 31 (H) 6 - 20 mg/dL    Creatinine, Ser 9.50 0.44 - 1.00 mg/dL   Calcium 8.8 (L) 8.9 - 10.3 mg/dL   Phosphorus 2.0 (L) 2.5 - 4.6 mg/dL   Albumin 2.0 (L) 3.5 - 5.0 g/dL   GFR, Estimated >39 >39 mL/min    Comment: (NOTE) Calculated using the CKD-EPI Creatinine Equation (2021)    Anion gap 10 5 - 15    Comment: Performed at [facility], [address], [place], [place] [address]  Magnesium      Status: Abnormal   Collection Time: 10/07/23  4:26 AM  Result Value Ref Range   Magnesium  2.7 (H) 1.7 - 2.4 mg/dL    Comment: Performed at [facility], [address] [place], [place] [address]  Glucose, capillary     Status: None   Collection Time: 10/07/23  7:46 AM  Result Value Ref Range   Glucose-Capillary 70 70 - 99 mg/dL    Comment: Glucose reference range applies only to samples taken after fasting for at least 8 hours.  Glucose, capillary     Status: Abnormal   Collection Time: 10/07/23  1:21 PM  Result Value Ref Range   Glucose-Capillary 126 (H) 70 - 99 mg/dL    Comment: Glucose reference range applies only to samples taken after fasting for at least 8 hours.  Glucose, capillary     Status: None   Collection Time: 10/07/23  4:43 PM  Result Value Ref Range   Glucose-Capillary 95 70 - 99 mg/dL    Comment: Glucose reference range applies only to samples taken after fasting for at least 8 hours.  Glucose, capillary     Status: Abnormal   Collection Time: 10/07/23  8:04 PM  Result Value Ref Range   Glucose-Capillary 180 (H) 70 - 99 mg/dL  Comment: Glucose reference range applies only to samples taken after fasting for at least 8 hours.  Glucose, capillary     Status: Abnormal   Collection Time: 10/07/23 11:57 PM  Result Value Ref Range   Glucose-Capillary 142 (H) 70 - 99 mg/dL    Comment: Glucose reference range applies only to samples taken after fasting for at least 8 hours.  Glucose, capillary     Status: None   Collection Time: 10/08/23  4:03 AM  Result Value Ref Range    Glucose-Capillary 93 70 - 99 mg/dL    Comment: Glucose reference range applies only to samples taken after fasting for at least 8 hours.  Renal function panel     Status: Abnormal   Collection Time: 10/08/23  6:04 AM  Result Value Ref Range   Sodium 144 135 - 145 mmol/L   Potassium 4.4 3.5 - 5.1 mmol/L   Chloride 103 98 - 111 mmol/L   CO2 35 (H) 22 - 32 mmol/L   Glucose, Bld 79 70 - 99 mg/dL    Comment: Glucose reference range applies only to samples taken after fasting for at least 8 hours.   BUN 23 (H) 6 - 20 mg/dL   Creatinine, Ser 9.46 0.44 - 1.00 mg/dL   Calcium 9.0 8.9 - 89.6 mg/dL   Phosphorus 2.4 (L) 2.5 - 4.6 mg/dL   Albumin 2.3 (L) 3.5 - 5.0 g/dL   GFR, Estimated >39 >39 mL/min    Comment: (NOTE) Calculated using the CKD-EPI Creatinine Equation (2021)    Anion gap 6 5 - 15    Comment: Performed at [facility], [address] [place], [place] [address]  Glucose, capillary     Status: Abnormal   Collection Time: 10/08/23  9:23 AM  Result Value Ref Range   Glucose-Capillary 64 (L) 70 - 99 mg/dL    Comment: Glucose reference range applies only to samples taken after fasting for at least 8 hours.  Glucose, capillary     Status: None   Collection Time: 10/08/23 10:52 AM  Result Value Ref Range   Glucose-Capillary 87 70 - 99 mg/dL    Comment: Glucose reference range applies only to samples taken after fasting for at least 8 hours.  . No results found..   ASSESSMENT: Status post tracheostomy last week.  She now has a #6 uncuffed Shiley nonfenestrated tracheostomy tube in place.  Neck wound is slow to heal most likely secondary to postop secretions and poor nutritional status.  PLAN: Apparently the plan is for the patient to be discharged home as early as today with a plan for hospice care.  She should have a replacement #6 Shiley uncuffed nonfenestrated tracheostomy tube available as a backup and for future tracheostomy tube changes.  She is going to require frequent  suctioning because of mucus secretions from the throat impinging on the airway.  A tracheostomy split sponge dressing should be kept under the flange of the tracheostomy tube to protect the skin.  She will need extra inner cannulas to facilitate easy cleaning of the tracheostomy by home health care.  Tracheostomy cleaning sets and a home suction with appropriate suction cannulas should be provided.  She should have a supply of replacement tracheostomy straps. Home health care and respiratory therapy can assist with any recommendations for any other supply needs I may have overlooked.  Follow-up can be scheduled with me in the next 3 or 4 weeks unless the preference is for home health to provide care  since she is in hospice and transport may be difficult.  Whitney GORMAN Dolly, MD 10/08/2023 12:19 PM

## 2023-10-08 NOTE — Discharge Summary (Addendum)
 Physician Discharge Summary   Patient: Whitney [name] MRN: [id number] DOB: March 21, 1970  Admit date:     10/02/2023  Discharge date: 10/08/23  Discharge Physician: Delon Herald   PCP: Ernie Yancy Roof, MD   Recommendations at discharge:   You are being discharged home with home hospice Home O2 is to be arranged through hospice Follow up with oncology to discuss biopsy results and plan of care if you decide not to continue with hospice Continue morphine  (now by tube rather than by mouth) as needed for pain Take atovaquone  and Biktarvy  by tube; follow up with infectious disease Hold Zetia for now Use Protonix  twice daily per tube Use Miralax  per tube as needed for constipation Take folate, multivitamin, and iron  (Niferex) by tube daily Tube feeds are 250 ml Nutren 1.5 via g-tube 4 times daily; 50 ml free water  flush before and after each administration; hospice will provide tube feeds Follow up with surgery on 8/27 as scheduled Follow up with Dr. Ernie or hospice physician   Discharge Diagnoses: Principal Problem:   Laryngeal cancer [facility]) Active Problems:   Head and neck cancer (HCC)   Lung mass   HIV disease (HCC)   Protein-calorie malnutrition, severe   Anemia   COPD (chronic obstructive pulmonary disease) (HCC)   Hyperlipidemia    Hospital Course: 53yo with h/o HIV, COPD, hypertension, hyperlipidemia, malnutrition, PTSD, stroke, anxiety, and depression who presented on 10/02/23 for elective Tracheostomy, G-tube, and Chest port placement due to metastatic head and neck SCC. Post procedure she was transferred to Stepdown/ICU for close monitoring. Tracheostomy and bronchoscopy with BAL and biopsy were done on 10/03/2023.  Patient with very poor long-term prognosis.  Palliative care was also consulted.  Patient and family are considering home with hospice vs. HH services.  She has been very insistent about discharging ASAP.  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, and there is also concern of a primary lung cancer with a lung mass which was biopsied  and is pending results MRI brain negative for any metastatic disease Oncology recommended hospice but patient wants to do everything possible, s/p trach and PEG tube placement as recommended by oncology before starting any treatment Trach collar in place with persistent O2 requirements; needs trach collar O2 in place prior to dc Patient has very poor prognosis Follow-up with outpatient oncology after lung lesion pathology results Palliative care from cancer center is on board Palliative care in hospital was also consulted, appears to be moving toward comfort care Patient and family are deciding about comfort vs. Home health and will need equipment in place prior to dc She is due for trach exchange and suture removal on Wednesday (8/20) so this was done today prior to dc Still undergoing Pa[place] Muir valve assessments, working with SLP; continue as outpatient   HIV disease Patient was not taking her HIV medication for some time due to worsening dysphagia secondary to laryngeal cancer CD4 count and BAL cultures are pending ID was consulted to restart HIV medications as it has to be optimized before concentration of any oncologic treatment Patient is high risk for opportunistic infections and immune reconstitution syndrome ID restarted Biktarvy  This can be reconsidered by hospice and family after dc   Protein-calorie malnutrition, severe Patient with severe cachexia, significant recent weight loss which seems multifactorial with her untreated HIV and new diagnosis of metastatic malignancy She  was having significant dysphagia and unable to eat S/p PEG tube placement and she was started on tube feeds High risk for refeeding syndrome Continue with  tube feeds as recommended by dietitian Needs TF supplies at home and education prior to dc   Anemia Anemia panel consistent with anemia of chronic disease with iron  and folic acid  deficiency, B12 WNL Started on supplement   COPD (chronic obstructive pulmonary disease)  No current wheezing S/p tracheostomy for advanced laryngeal cancer Continue Advair, Spiriva , Albuterol    Hyperlipidemia Home Zetia has been held for a while due to worsening dysphagia, will continue to hold Reasonable lipid control Would not add additional medications for this issue, as she has other more life-threatening issues at this time  Stage 3 pressure ulcer WOC Nurse consult note 8/18 Wound type: 1.  Deep Tissue Pressure Injury sacrum  2.  Stage 3 Pressure Injury Coccyx   Pressure Injury POA: Yes  Comfort measures   GOC Palliative care is consulting She is now DNR Ongoing discussion regarding comfort care is appropriate 8/17, she was demanding discharge and threatening to leave AMA if not discharged by 2pm She reported that she is planning for home hospice On subsequent discussions, she is uncertain about hospice vs. Palliative and is not ready to commit to either yet - which delayed her ability to go home even though she clearly desires this Will need outpatient palliative care vs. Hospice Palliative care and hospice teams are following After further discussion, patient and family are in agreement with home with hospice and she really wants to go home today once everything is coordinated        Consultants: PCCM Surgery  ENT ID Oncology (telephone only) Palliative care PT OT [facility] team   Procedures: Tracheostomy 8/13 L internal jugular 8/13 Gastrostomy tube 8/13   Antibiotics: Cefotetan  x 3 doses  Pain control - [place]  Controlled Substance Reporting System database was reviewed. and patient was instructed, not to drive, operate heavy machinery, perform activities at heights,  swimming or participation in water  activities or provide baby-sitting services while on Pain, Sleep and Anxiety Medications; until their outpatient Physician has advised to do so again. Also recommended to not to take more than prescribed Pain, Sleep and Anxiety Medications.   Disposition: Hospice care Diet recommendation:  NPO   DISCHARGE MEDICATION: Allergies as of 10/08/2023       Reactions   Penicillins Hives   Aspirin Rash, Other (See Comments)   Reaction:  GI upset         Medication List     PAUSE taking these medications    ezetimibe 10 MG tablet Wait to take this until your doctor or other care provider tells you to start again. Commonly known as: ZETIA Take 10 mg by mouth daily.       STOP taking these medications    abacavir -dolutegravir -lamiVUDine  600-50-300 MG tablet Commonly known as: TRIUMEQ   famotidine  20 MG tablet Commonly known as: PEPCID    feeding supplement Liqd Replaced by: Nutren 1.5 Liqd       TAKE these medications    acetaminophen  160 MG/5ML suspension Commonly known as: Tylenol  Childrens Take 15.6 mLs (500 mg total) by mouth every 6 (six) hours as needed.   albuterol  1.25 MG/3ML nebulizer solution Commonly known as: ACCUNEB  Take 3 mLs (1.25 mg total) by nebulization every 6 (six) hours as needed for wheezing.   alum & mag hydroxide-simeth 200-200-20 MG/5ML suspension Commonly known as: MAALOX/MYLANTA Take 15 mLs by mouth every  6 (six) hours as needed for indigestion or heartburn.   atovaquone  750 MG/5ML suspension Commonly known as: MEPRON  Place 10 mLs (1,500 mg total) into feeding tube daily with breakfast.   bictegravir-emtricitabine -tenofovir  AF 50-200-25 MG Tabs tablet Commonly known as: BIKTARVY  Take 1 tablet by mouth daily. Start taking on: October 09, 2023   fluconazole  100 MG tablet Commonly known as: DIFLUCAN  Place 1 tablet (100 mg total) into feeding tube daily for 3 days. Start taking on: October 09, 2023    Fluticasone -Salmeterol 250-50 MCG/DOSE Aepb Commonly known as: ADVAIR Inhale 1 puff into the lungs 2 (two) times daily.   folic acid  1 MG tablet Commonly known as: FOLVITE  Place 1 tablet (1 mg total) into feeding tube daily.   free water  Soln Place 100 mLs into feeding tube 4 (four) times daily. 50 mLs before and 50 mLs after each feeding administration   iron  polysaccharides 150 MG capsule Commonly known as: NIFEREX Place 1 capsule (150 mg total) into feeding tube daily.   leptospermum manuka honey Pste paste Apply 1 Application topically daily.   lidocaine  2 % solution Commonly known as: XYLOCAINE  Use as directed 15 mLs in the mouth or throat as needed for mouth pain.   liver oil-zinc  oxide 40 % ointment Commonly known as: DESITIN Apply topically 2 (two) times daily.   LORazepam  0.5 MG tablet Commonly known as: ATIVAN  Take 0.5 tablets (0.25 mg total) by mouth every 8 (eight) hours as needed for up to 1 day for anxiety.   morphine  CONCENTRATE 10 mg / 0.5 ml concentrated solution Take 0.25 mLs (5 mg total) by mouth every 6 (six) hours as needed for up to 5 days.   multivitamin with minerals Tabs tablet Place 1 tablet into feeding tube daily.   naloxone  4 MG/0.1ML Liqd nasal spray kit Commonly known as: NARCAN  SPRAY 1 SPRAY INTO ONE NOSTRIL AS DIRECTED FOR OPIOID OVERDOSE (TURN PERSON ON SIDE AFTER DOSE. IF NO RESPONSE IN 2-3 MINUTES OR PERSON RESPONDS BUT RELAPSES, REPEAT USING A NEW SPRAY DEVICE AND SPRAY INTO THE OTHER NOSTRIL. CALL 911 AFTER USE.) * EMERGENCY USE ONLY *   Nutren 1.5 Liqd 250 mLs by Enteral route in the morning, at noon, in the evening, and at bedtime. Replaces: feeding supplement Liqd   ondansetron  4 MG disintegrating tablet Commonly known as: ZOFRAN -ODT Take 1 tablet (4 mg total) by mouth every 8 (eight) hours as needed for nausea or vomiting.   pantoprazole  40 MG tablet Commonly known as: Protonix  Take 1 tablet (40 mg total) by mouth 2 (two)  times daily.   Phenylephrine -DM-GG-APAP 5-10-200-325 MG/15ML Liqd Take 15 mLs by mouth daily as needed (allergies).   polyethylene glycol 17 g packet Commonly known as: MIRALAX  / GLYCOLAX  Place 17 g into feeding tube daily as needed for moderate constipation.   tiotropium 18 MCG inhalation capsule Commonly known as: SPIRIVA  Place 18 mcg into inhaler and inhale daily.   triamcinolone ointment 0.1 % Commonly known as: KENALOG Apply 1 Application topically daily as needed (irritation).               Durable Medical Equipment  (From admission, onward)           Start     Ordered   10/08/23 1311  DME Oxygen  Once       Question Answer Comment  Length of Need Lifetime   Mode or (Route) Mask   Liters per Minute 10   Frequency Continuous (stationary and portable oxygen unit needed)  Oxygen conserving device Yes   Oxygen delivery system Gas      10/08/23 1321   10/07/23 1254  For home use only DME Walker rolling  Once       Question Answer Comment  Walker: With 5 Inch Wheels   Patient needs a walker to treat with the following condition Ambulatory dysfunction      10/07/23 1253   10/07/23 1253  For home use only DME 3 n 1  Once        10/07/23 1252   10/06/23 1431  For home use only DME oxygen  Once       Question Answer Comment  Length of Need Lifetime   Liters per Minute 10   Frequency Continuous (stationary and portable oxygen unit needed)   Oxygen conserving device Yes   Oxygen delivery system Gas      10/06/23 1431              Discharge Care Instructions  (From admission, onward)           Start     Ordered   10/08/23 0000  Discharge wound care:       Comments: Cleanse coccyx wound with NS, apply Medihoney to wound bed daily, cover with dry gauze and secure with silicone foam or ABD pad whichever is preferred. Interchangeable with TheraHoney Apply thin layer (3 mm) to wound.   10/08/23 1321            Follow-up Information      Terryl Arthea SAUNDERS, PA-C Follow up on 10/16/2023.   Specialty: Physician Assistant Contact information: [address] [place] [address] [phone]                Discharge Exam:   Subjective: She is still very eager to go home.  She understands that going home with hospice means that she is deferring additional treatments of her cancer and is willing to agree to this.  Family is in agreement with home with hospice today.   Objective: Vitals:   10/08/23 0900 10/08/23 1236  BP: (!) 133/96 132/88  Pulse: 66 63  Resp: 20 19  Temp: 98.3 F (36.8 C) 98.3 F (36.8 C)  SpO2: 97% 97%    Intake/Output Summary (Last 24 hours) at 10/08/2023 1322 Last data filed at 10/08/2023 0951 Gross per 24 hour  Intake 637 ml  Output 400 ml  Net 237 ml   Filed Weights   10/06/23 0600 10/07/23 0419 10/08/23 0500  Weight: 36.6 kg 38.7 kg 38.2 kg    Exam:  General:  Appears calm and comfortable and is in NAD; frail, chronically ill-appearing, cachectic Eyes:  normal lids, iris ENT:  grossly normal hearing, lips & tongue, mmm Neck:  trach in place with trach collar O2 Cardiovascular:  RRR. No LE edema.  Respiratory:   Scattered upper airway noise vs. Subtle rhonchi.  Normal respiratory effort. Abdomen:  soft, NT, ND Skin:  no rash or induration seen on limited exam Musculoskeletal:  grossly normal tone BUE/BLE, good ROM, no bony abnormality Psychiatric:  grossly normal mood and affect, unable to speak with trach but clearly mouths words appropriately and writes anything I could not understand Neurologic:  CN 2-12 grossly intact, moves all extremities in coordinated fashion  Data Reviewed: I have reviewed the patient's lab results since admission.  Pertinent labs for today include:   Stable BMP    Condition at discharge: poor  The results of significant diagnostics from this  hospitalization (including imaging, microbiology, ancillary and laboratory) are listed below  for reference.   Imaging Studies: MR BRAIN W WO CONTRAST Result Date: 10/05/2023 EXAM: MRI BRAIN WITH AND WITHOUT CONTRAST 10/05/2023 03:18:09 AM TECHNIQUE: Multiplanar multisequence MRI of the head/brain was performed with and without the administration of intravenous contrast. COMPARISON: None available. CLINICAL HISTORY: Metastatic disease evaluation. FINDINGS: BRAIN AND VENTRICLES: No acute infarct. No acute intracranial hemorrhage. No mass effect or midline shift. No hydrocephalus. The sella is unremarkable. Normal flow voids. No mass or abnormal enhancement. Multifocal hyperintense T2-weighted signal within the cerebral white matter, most commonly due to chronic small vessel disease. ORBITS: No acute abnormality. SINUSES: No acute abnormality. BONES AND SOFT TISSUES: Normal bone marrow signal and enhancement. No acute soft tissue abnormality. IMPRESSION: 1. No acute intracranial abnormality or intracranial metastatic disease. 2. Multifocal hyperintense T2-weighted signal within the cerebral white matter, most commonly due to chronic small vessel disease. Electronically signed by: Franky Stanford MD 10/05/2023 03:34 AM EDT RP Workstation: [id number]   DG Chest Port 1 View Result Date: 10/03/2023 CLINICAL DATA:  Status post bronchoscopy. EXAM: PORTABLE CHEST 1 VIEW COMPARISON:  10/02/2023. FINDINGS: Stable cardiomediastinal contours. Tracheostomy cannula in place. Left IJ Port-A-Cath tip in the right atrium, unchanged. No pneumothorax. No acute airspace disease or sizable pleural effusion. Left hilar mass is grossly unchanged. Hyperinflation. Redemonstrated air beneath the left hemidiaphragm, likely relates to recent gastrostomy placement. Visualized osseous structures are unchanged. IMPRESSION: 1. No significant change compared to the prior exam. No pneumothorax identified. 2. Left hilar mass is grossly unchanged. 3. Redemonstrated air beneath the left hemidiaphragm, likely relates to recent gastrostomy  placement. Electronically Signed   By: Harrietta Sherry M.D.   On: 10/03/2023 13:40   DG CHEST PORT 1 VIEW Result Date: 10/02/2023 CLINICAL DATA:  Port-A-Cath in place. Open gastrostomy tube placement performed earlier today. EXAM: PORTABLE CHEST 1 VIEW COMPARISON:  Radiographs 09/26/2023 FINDINGS: New left internal jugular venous catheter Port-A-Cath in place. There is a kink in the catheter just above the left clavicle. Catheter tip is in the right atrium. Tracheostomy tube tip at the thoracic inlet. No visible pneumothorax. Stable heart size and mediastinal contours. Left hilar mass is grossly stable in radiographic appearance. The lungs are hyperinflated. No pleural fluid or acute airspace disease. There is air under both hemidiaphragms, likely related to gastrostomy placement. Gastrostomy is located in the left upper quadrant. IMPRESSION: 1. New left internal jugular venous catheter Port-A-Cath in place. There is a kink in the catheter just above the left clavicle. Catheter tip in the right atrium. No pneumothorax. 2. Left hilar mass is grossly stable in radiographic appearance. 3. Air under both hemidiaphragms, likely related to same day gastrostomy placement. Electronically Signed   By: Andrea Gasman M.D.   On: 10/02/2023 15:55   DG C-Arm 1-60 Min-No Report Result Date: 10/02/2023 Fluoroscopy was utilized by the requesting physician.  No radiographic interpretation.   DG Chest Port 1 View Result Date: 09/26/2023 CLINICAL DATA:  weakness, hx of throat mass.  Lung cancer. EXAM: PORTABLE CHEST 1 VIEW COMPARISON:  Chest x-ray 09/01/2023, PET CT 09/04/2023 FINDINGS: The heart and mediastinal contours are unchanged. Redemonstration of similar-appearing left hilar mass measuring approximately 8.5 x 4 cm. No focal consolidation. No pulmonary edema. No pleural effusion. No pneumothorax. No acute osseous abnormality. IMPRESSION: 1. No acute cardiopulmonary abnormality. 2. Redemonstration of similar-appearing  left hilar mass measuring approximately 8.5 x 4 cm. Finding better evaluated on PET CT 09/04/2023 Electronically Signed   By:  Morgane  Naveau M.D.   On: 09/26/2023 19:21   US  CORE BIOPSY (LYMPH NODES) Result Date: 09/16/2023 INDICATION: Laryngeal mass with hypermetabolic right cervical lymphadenopathy concerning for primary head and neck cancer with nodal metastatic disease. Patient presents for ultrasound-guided core biopsy to confirm tissue diagnosis. EXAM: ULTRASOUND BIOPSY OF THE LYMPH NODES MEDICATIONS: None. ANESTHESIA/SEDATION: None. FLUOROSCOPY TIME:  None. COMPLICATIONS: None immediate. PROCEDURE: Informed written consent was obtained from the patient after a thorough discussion of the procedural risks, benefits and alternatives. All questions were addressed. Maximal Sterile Barrier Technique was utilized including caps, mask, sterile gowns, sterile gloves, sterile drape, hand hygiene and skin antiseptic. A timeout was performed prior to the initiation of the procedure. Ultrasound was used to evaluate the right cervical lymph nodes. Obvious pathologic lymphadenopathy is identified. Local anesthesia was attained by infiltration with 1% lidocaine . A small dermatotomy was made. Under real-time ultrasound guidance, multiple 18 gauge core biopsies were obtained using the are gone automated biopsy device. Biopsy specimens were placed on a Telfa pad and delivered to pathology for further analysis. IMPRESSION: Successful ultrasound-guided core biopsy of right cervical lymph node. Electronically Signed   By: Wilkie Lent M.D.   On: 09/16/2023 15:16    Microbiology: Results for orders placed or performed during the hospital encounter of 10/02/23  MRSA Next Gen by PCR, Nasal     Status: None   Collection Time: 10/02/23  2:57 PM   Specimen: Nasal Mucosa; Nasal Swab  Result Value Ref Range Status   MRSA by PCR Next Gen NOT DETECTED NOT DETECTED Final    Comment: (NOTE) The GeneXpert MRSA Assay (FDA  approved for NASAL specimens only), is one component of a comprehensive MRSA colonization surveillance program. It is not intended to diagnose MRSA infection nor to guide or monitor treatment for MRSA infections. Test performance is not FDA approved in patients less than 57 years old. Performed at [facility], [address] [place], [place] [address]   Culture, fungus without smear     Status: None (Preliminary result)   Collection Time: 10/03/23 12:56 PM   Specimen: Bronchoalveolar Lavage; Lung  Result Value Ref Range Status   Specimen Description   Final    BRONCHIAL ALVEOLAR LAVAGE Performed at [facility], [address], [place], [place] [address]    Special Requests   Final    NONE Performed at [facility], [address] [place], [place] [address]    Culture   Final    NO FUNGUS ISOLATED AFTER 5 DAYS Performed at [facility], [address] [place], [place] [address]    Report Status PENDING  Incomplete  Virus culture     Status: None   Collection Time: 10/03/23 12:56 PM   Specimen: Bronchoalveolar Lavage  Result Value Ref Range Status   Viral Culture Comment  Final    Comment: (NOTE) Preliminary Report: No virus isolated at 24 hours. Next report to follow after 4 days. Performed At: [facility] [address] [place], [place] [id number] Jennette Shorter MD Ey:[id number]    Source of Sample BRONCHIAL ALVEOLAR LAVAGE  Final    Comment: Performed at [facility], [address] [place], [place] [address]  Acid Fast Smear (AFB)     Status: None   Collection Time: 10/03/23 12:57 PM   Specimen: Bronchoalveolar Lavage; Respiratory  Result Value Ref Range Status   AFB Specimen Processing Concentration  Final   Acid Fast Smear Negative  Final  Comment: (NOTE) Performed At: [facility] [address] [place], [place] [id number] Jennette Shorter MD Ey:[id number]    Source (AFB) BRONCHIAL ALVEOLAR  LAVAGE  Final    Comment: Performed at [facility], [address] [place], [place] [address]  Culture, BAL-quantitative w Gram Stain     Status: Abnormal   Collection Time: 10/03/23 12:57 PM   Specimen: Bronchoalveolar Lavage; Respiratory  Result Value Ref Range Status   Specimen Description   Final    BRONCHIAL ALVEOLAR LAVAGE Performed at [facility], [address] [place], [place] [address]    Special Requests   Final    NONE Performed at [facility], [address] [place], [place] [address]    Gram Stain   Final    FEW WBC PRESENT, PREDOMINANTLY PMN RARE GRAM POSITIVE COCCI    Culture (A)  Final    60,000 COLONIES/mL ACINETOBACTER CALCOACETICUS/BAUMANNII COMPLEX 80,000 COLONIES/mL KLEBSIELLA AEROGENES 50,000 COLONIES/mL HAEMOPHILUS INFLUENZAE BETA LACTAMASE NEGATIVE Performed at [facility], [address], [place], [place] [address]    Report Status 10/07/2023 FINAL  Final   Organism ID, Bacteria ACINETOBACTER CALCOACETICUS/BAUMANNII COMPLEX (A)  Final   Organism ID, Bacteria KLEBSIELLA AEROGENES (A)  Final      Susceptibility   Acinetobacter calcoaceticus/baumannii complex - MIC*    IMIPENEM <=0.5 SENSITIVE Sensitive     PIP/TAZO Value in next row Sensitive ug/mL     <=4 SENSITIVEThis is a modified FDA-approved test that has been validated and its performance characteristics determined by the reporting laboratory.  This laboratory is certified under the Clinical Laboratory Improvement Amendments CLIA as qualified to perform high complexity clinical laboratory testing.    AMPICILLIN/SULBACTAM Value in next row Sensitive      <=4 SENSITIVEThis is a modified FDA-approved test that has been validated and its performance characteristics determined by the reporting laboratory.  This laboratory is certified under the Clinical Laboratory Improvement Amendments CLIA as qualified to perform high complexity clinical laboratory testing.     TOBRAMYCIN Value in next row Sensitive      <=4 SENSITIVEThis is a modified FDA-approved test that has been validated and its performance characteristics determined by the reporting laboratory.  This laboratory is certified under the Clinical Laboratory Improvement Amendments CLIA as qualified to perform high complexity clinical laboratory testing.    MEROPENEM Value in next row Sensitive      <=4 SENSITIVEThis is a modified FDA-approved test that has been validated and its performance characteristics determined by the reporting laboratory.  This laboratory is certified under the Clinical Laboratory Improvement Amendments CLIA as qualified to perform high complexity clinical laboratory testing.    * 60,000 COLONIES/mL ACINETOBACTER CALCOACETICUS/BAUMANNII COMPLEX   Klebsiella aerogenes - MIC*    CEFEPIME Value in next row Sensitive      <=4 SENSITIVEThis is a modified FDA-approved test that has been validated and its performance characteristics determined by the reporting laboratory.  This laboratory is certified under the Clinical Laboratory Improvement Amendments CLIA as qualified to perform high complexity clinical laboratory testing.    ERTAPENEM Value in next row Sensitive      <=4 SENSITIVEThis is a modified FDA-approved test that has been validated and its performance characteristics determined by the reporting laboratory.  This laboratory is certified under the Clinical Laboratory Improvement Amendments CLIA as qualified to perform high complexity clinical laboratory testing.    CEFTRIAXONE Value in next row Sensitive      <=4 SENSITIVEThis is a modified FDA-approved  test that has been validated and its performance characteristics determined by the reporting laboratory.  This laboratory is certified under the Clinical Laboratory Improvement Amendments CLIA as qualified to perform high complexity clinical laboratory testing.    CIPROFLOXACIN Value in next row Sensitive      <=4 SENSITIVEThis is a  modified FDA-approved test that has been validated and its performance characteristics determined by the reporting laboratory.  This laboratory is certified under the Clinical Laboratory Improvement Amendments CLIA as qualified to perform high complexity clinical laboratory testing.    GENTAMICIN  Value in next row Sensitive      <=4 SENSITIVEThis is a modified FDA-approved test that has been validated and its performance characteristics determined by the reporting laboratory.  This laboratory is certified under the Clinical Laboratory Improvement Amendments CLIA as qualified to perform high complexity clinical laboratory testing.    MEROPENEM Value in next row Sensitive      <=4 SENSITIVEThis is a modified FDA-approved test that has been validated and its performance characteristics determined by the reporting laboratory.  This laboratory is certified under the Clinical Laboratory Improvement Amendments CLIA as qualified to perform high complexity clinical laboratory testing.    TRIMETH/SULFA Value in next row Sensitive      <=4 SENSITIVEThis is a modified FDA-approved test that has been validated and its performance characteristics determined by the reporting laboratory.  This laboratory is certified under the Clinical Laboratory Improvement Amendments CLIA as qualified to perform high complexity clinical laboratory testing.    PIP/TAZO Value in next row Sensitive ug/mL     <=4 SENSITIVEThis is a modified FDA-approved test that has been validated and its performance characteristics determined by the reporting laboratory.  This laboratory is certified under the Clinical Laboratory Improvement Amendments CLIA as qualified to perform high complexity clinical laboratory testing.    * 80,000 COLONIES/mL KLEBSIELLA AEROGENES    Labs: CBC: Recent Labs  Lab 10/02/23 1558 10/02/23 1852 10/03/23 0544 10/04/23 0802  WBC 5.9 9.0 11.0* 7.9  NEUTROABS  --  7.9*  --   --   HGB 13.3 13.0 12.9 11.7*  HCT 43.5  42.0 43.2 39.3  MCV 85.5 85 85.7 85.1  PLT 172 187 174 181   Basic Metabolic Panel: Recent Labs  Lab 10/03/23 0544 10/04/23 0802 10/05/23 0648 10/06/23 0508 10/07/23 0426 10/08/23 0604  NA 142 142 142 144 147* 144  K 4.4 4.1 4.1 4.6 4.1 4.4  CL 101 105 102 106 106 103  CO2 33* 32 34* 38* 31 35*  GLUCOSE 65* 98 95 72 86 79  BUN 42* 46* 39* 38* 31* 23*  CREATININE 1.02* 0.90 0.72 0.57 0.49 0.53  CALCIUM 11.3* 10.0 9.5 9.3 8.8* 9.0  MG 2.8* 2.5* 2.4 2.6* 2.7*  --   PHOS 5.9* 3.8 2.4* 1.5* 2.0* 2.4*   Liver Function Tests: Recent Labs  Lab 10/04/23 0802 10/05/23 0648 10/06/23 0508 10/07/23 0426 10/08/23 0604  ALBUMIN 2.1* 2.1* 2.0* 2.0* 2.3*   CBG: Recent Labs  Lab 10/07/23 2357 10/08/23 0403 10/08/23 0923 10/08/23 1052 10/08/23 1239  GLUCAP 142* 93 64* 87 101*    Discharge time spent: greater than 30 minutes.  Signed: Delon Herald, MD [facility] 10/08/2023

## 2023-10-09 ENCOUNTER — Encounter: Payer: Self-pay | Admitting: *Deleted

## 2023-10-09 ENCOUNTER — Other Ambulatory Visit: Payer: Self-pay

## 2023-10-09 LAB — GLUCOSE, CAPILLARY: Glucose-Capillary: 94 mg/dL (ref 70–99)

## 2023-10-09 LAB — GENOSURE PRIME (GSPRIL)

## 2023-10-09 NOTE — Progress Notes (Signed)
 Pt discharged home for the hospital with hospice services. Called and spoke to pt's son, [name], to confirm pt's decision to pursue hospice and decline any further cancer treatment. Joey stated that pt would not be able to tolerate any cancer treatment at this time and wishes to remain comfortable. All questions answered during call. Informed pt's son to call back if has any further questions or needs. Joey verbalized understanding.

## 2023-10-11 LAB — VIRUS CULTURE

## 2023-10-14 ENCOUNTER — Inpatient Hospital Stay: Admitting: Oncology

## 2023-10-15 NOTE — Progress Notes (Signed)
 Note in patient's chart on 10/09/2023 stating patient is pursuing hospice services.  We will close out her referral.

## 2023-10-18 DIAGNOSIS — E440 Moderate protein-calorie malnutrition: Secondary | ICD-10-CM | POA: Diagnosis not present

## 2023-10-23 ENCOUNTER — Encounter

## 2023-10-23 LAB — CULTURE, FUNGUS WITHOUT SMEAR

## 2023-10-30 ENCOUNTER — Other Ambulatory Visit: Payer: Self-pay

## 2023-11-12 ENCOUNTER — Emergency Department
Admission: EM | Admit: 2023-11-12 | Discharge: 2023-11-12 | Disposition: A | Discharge status: Hospice | Attending: Emergency Medicine | Admitting: Emergency Medicine

## 2023-11-12 ENCOUNTER — Other Ambulatory Visit: Payer: Self-pay

## 2023-11-12 ENCOUNTER — Emergency Department

## 2023-11-12 DIAGNOSIS — K9423 Gastrostomy malfunction: Secondary | ICD-10-CM | POA: Insufficient documentation

## 2023-11-12 DIAGNOSIS — Z21 Asymptomatic human immunodeficiency virus [HIV] infection status: Secondary | ICD-10-CM | POA: Insufficient documentation

## 2023-11-12 DIAGNOSIS — Z85818 Personal history of malignant neoplasm of other sites of lip, oral cavity, and pharynx: Secondary | ICD-10-CM | POA: Diagnosis not present

## 2023-11-12 DIAGNOSIS — I10 Essential (primary) hypertension: Secondary | ICD-10-CM | POA: Diagnosis not present

## 2023-11-12 DIAGNOSIS — R531 Weakness: Secondary | ICD-10-CM | POA: Diagnosis not present

## 2023-11-12 DIAGNOSIS — J449 Chronic obstructive pulmonary disease, unspecified: Secondary | ICD-10-CM | POA: Insufficient documentation

## 2023-11-12 NOTE — ED Triage Notes (Signed)
 Pt arrived via EMS due to her feeding tube fell out.

## 2023-11-12 NOTE — ED Provider Notes (Signed)
 [facility] Provider Note    Event Date/Time   First MD Initiated Contact with Patient 11/12/23 1816     (approximate)  History   Chief Complaint: Feeding Tube   HPI  Whitney [name] is a 53 y.o. female with a past medical history of anemia, COPD, gastric reflux, HIV, hypertension, hyperlipidemia, lung mass, throat cancer status post tracheostomy, currently on hospice care, presents to the emergency department after the G-tube accidentally dislodged.  Son is here with the patient who brought the dislodged G-tube.  They are hoping to have the G-tube replaced.  No other acute complaints.  Physical Exam   Triage Vital Signs: ED Triage Vitals  Encounter Vitals Group     BP 11/12/23 1711 (!) 129/109     Girls Systolic BP Percentile --      Girls Diastolic BP Percentile --      Boys Systolic BP Percentile --      Boys Diastolic BP Percentile --      Pulse Rate 11/12/23 1711 61     Resp 11/12/23 1711 17     Temp 11/12/23 1711 98.3 F (36.8 C)     Temp Source 11/12/23 1711 Oral     SpO2 11/12/23 1711 100 %     Weight 11/12/23 1715 83 lb (37.6 kg)     Height 11/12/23 1715 5' 4 (1.626 m)     Head Circumference --      Peak Flow --      Pain Score 11/12/23 1714 5     Pain Loc --      Pain Education --      Exclude from Growth Chart --     Most recent vital signs: Vitals:   11/12/23 1711  BP: (!) 129/109  Pulse: 61  Resp: 17  Temp: 98.3 F (36.8 C)  SpO2: 100%    General: Awake, no distress.  Frail/cachectic in appearance. CV:  Good peripheral perfusion.  Regular rate and rhythm  Resp:  Normal effort.  Equal breath sounds bilaterally.  Abd:  No distention.  Soft, nontender.  G-tube ostomy present.  ED Results / Procedures / Treatments    RADIOLOGY  I have reviewed and interpreted chest x-ray images.  Patient has a left-sided hilar mass however this appears largely unchanged from the patient's prior x-ray. Radiology states stable left  perihilar mass with pulmonary vascular redistribution no edema.   MEDICATIONS ORDERED IN ED: Medications - No data to display   IMPRESSION / MDM / ASSESSMENT AND PLAN / ED COURSE  I reviewed the triage vital signs and the nursing notes.  [name] presentation is most consistent with acute presentation with potential threat to life or bodily function.  Patient presents to the emergency department for dislodged G-tube.  Patient has a G-tube with her appears to be an 1 [place] G-tube.  I was able to replace with a an 31 [place] G-tube without issue.  Patient does have some secretions from her tracheostomy somewhat pink-tinged.  Son states this has been occurring intermittently recently.  States her tracheostomy did get pulled out a tiny bit earlier today and they had to place it back in.  Respiratory has seen the patient has suctioned the tube.  Patient appears comfortable chest x-ray shows no acute finding.  Will discharge with continued outpatient follow-up and hospice care.  FINAL CLINICAL IMPRESSION(S) / ED DIAGNOSES   Dislodged G-tube    Note:  This document was prepared using Dragon voice recognition software  and may include unintentional dictation errors.   Dorothyann Drivers, MD 11/12/23 1943

## 2023-11-12 NOTE — Progress Notes (Signed)
 Pt. Suctionned for small amt. Thick bloody secretions. Pt. Tolerated procedure well.

## 2023-11-18 LAB — ACID FAST CULTURE WITH REFLEXED SENSITIVITIES (MYCOBACTERIA): Acid Fast Culture: NEGATIVE

## 2023-11-24 ENCOUNTER — Other Ambulatory Visit: Payer: Self-pay

## 2023-11-24 ENCOUNTER — Emergency Department
Admission: EM | Admit: 2023-11-24 | Discharge: 2023-11-24 | Disposition: A | Discharge status: Home / Self Care | Attending: Emergency Medicine | Admitting: Emergency Medicine

## 2023-11-24 ENCOUNTER — Encounter: Payer: Self-pay | Admitting: Emergency Medicine

## 2023-11-24 ENCOUNTER — Emergency Department

## 2023-11-24 DIAGNOSIS — K9423 Gastrostomy malfunction: Secondary | ICD-10-CM | POA: Insufficient documentation

## 2023-11-24 NOTE — ED Notes (Signed)
 Trip ticket for pick-up is [phone]

## 2023-11-24 NOTE — ED Notes (Signed)
 Got a call back from [facility] per Rosalynd,sts [facility] will be picking the patient up today but dont have a ETA

## 2023-11-24 NOTE — Progress Notes (Signed)
 pT. SUCTIONNED BY RT FOR SMALL AMT. OF THICK YELLOW SECRETIONS. PT. TOLERATED WELL.

## 2023-11-24 NOTE — ED Provider Notes (Signed)
 [facility] Provider Note    Event Date/Time   First MD Initiated Contact with Patient 11/24/23 1057     (approximate)   History   G-tube malfunction   HPI  Whitney [name] is a 53 y.o. female on hospice for multiple severe medical conditions presents because her G-tube accidentally was pulled out this morning around 9 AM.  Review of record demonstrates she had a 70 French G-tube, no other complaints reported by pain     Physical Exam   Triage Vital Signs: ED Triage Vitals  Encounter Vitals Group     BP 11/24/23 0959 119/83     Girls Systolic BP Percentile --      Girls Diastolic BP Percentile --      Boys Systolic BP Percentile --      Boys Diastolic BP Percentile --      Pulse Rate 11/24/23 0959 77     Resp 11/24/23 0959 20     Temp 11/24/23 0959 97.8 F (36.6 C)     Temp Source 11/24/23 0959 Oral     SpO2 11/24/23 0959 100 %     Weight 11/24/23 1005 37.6 kg (82 lb 14.3 oz)     Height 11/24/23 1005 1.626 m (5' 4)     Head Circumference --      Peak Flow --      Pain Score 11/24/23 1005 0     Pain Loc --      Pain Education --      Exclude from Growth Chart --     Most recent vital signs: Vitals:   11/24/23 0959  BP: 119/83  Pulse: 77  Resp: 20  Temp: 97.8 F (36.6 C)  SpO2: 100%     General: Awake, cachectic, chronically ill appearing CV:  Good peripheral perfusion.  Resp:  Trach collar in place Abd:  No distention.  G-tube stoma, no redness or discharge Other:     ED Results / Procedures / Treatments   Labs (all labs ordered are listed, but only abnormal results are displayed) Labs Reviewed - No data to display   EKG     RADIOLOGY KUB demonstrates Gastrografin in the stomach, confirmed by radiology    PROCEDURES:  Critical Care performed:   .Gastrostomy tube replacement  Date/Time: 11/24/2023 12:31 PM  Performed by: Arlander Charleston, MD Authorized by: Arlander Charleston, MD  Consent: Verbal consent  obtained Risks and benefits: risks, benefits and alternatives were discussed Imaging studies: imaging studies available Patient identity confirmed: arm band Local anesthesia used: no  Anesthesia: Local anesthesia used: no  Sedation: Patient sedated: no  Patient tolerance: patient tolerated the procedure well with no immediate complications      MEDICATIONS ORDERED IN ED: Medications - No data to display   IMPRESSION / MDM / ASSESSMENT AND PLAN / ED COURSE  I reviewed the triage vital signs and the nursing notes. [name] presentation is most consistent with acute illness / injury with system symptoms.  Patient with chronic G-tube pulled out this morning around 9 AM, review of records demonstrates she uses an 61 [place] G-tube, this was replaced without difficulty, confirmation of placement with KUB with Gastrografin, radiology has reviewed        FINAL CLINICAL IMPRESSION(S) / ED DIAGNOSES   Final diagnoses:  PEG tube malfunction (HCC)     Rx / DC Orders   ED Discharge Orders     None        Note:  This document was prepared using Dragon voice recognition software and may include unintentional dictation errors.   Arlander Charleston, MD 11/24/23 [phone]

## 2023-11-24 NOTE — ED Triage Notes (Signed)
 Presents via EMS from home  Needs feeding tube replaced  It was cut by care giver

## 2023-11-24 NOTE — ED Notes (Signed)
 Family disgruntled about delay. Pt educated about delays. Pt is currently resting and in no acute distress.

## 2023-11-24 NOTE — ED Notes (Signed)
 I was inform that [facility] that they was unable to transport patient back home until 11-25-23 be the drivers was exhausted

## 2023-11-24 NOTE — ED Notes (Signed)
 Patient will be picked up in 3 hours trip number is [phone]

## 2023-11-24 NOTE — ED Notes (Signed)
 Pt family is now requesting her to be suctioned and her trach be evaluated. RT called.

## 2023-11-24 NOTE — ED Notes (Signed)
 Bonnie with Hospice spoke with this RN, states pt is here just to get the g-tube replaced.

## 2023-11-24 NOTE — Progress Notes (Signed)
 Pt. Suctionned by RT for moderate amt. Of thick tan secretions. Pt. Tolerated well.

## 2023-11-24 NOTE — Progress Notes (Signed)
 Pt. Lavaged with normal saline and suctionned for moderate amt. Of thick brownish yellow secretions and inner cannula reolaced. Pt. Tolerated all well.

## 2023-11-24 NOTE — ED Notes (Signed)
 [facility] now leaving with pt.

## 2023-11-24 NOTE — ED Notes (Signed)
 I reach back out to [facility] it let them know that [facility] is unable to transport patient back home due to their driver was exhausted,I was told by [facility] Jesusa) that [facility] need to contact them ([facility]) to pick up patient

## 2023-11-24 NOTE — ED Notes (Signed)
[facility] here for pt.

## 2023-12-21 DEATH — deceased
